# Patient Record
Sex: Female | Born: 1935 | Race: White | Hispanic: No | Marital: Married | Smoking: Never smoker
Health system: Southern US, Community
[De-identification: ages and names within clinical notes are randomized; demographics above are authoritative.]

## PROBLEM LIST (undated history)

## (undated) DIAGNOSIS — N6019 Diffuse cystic mastopathy of unspecified breast: Secondary | ICD-10-CM

## (undated) DIAGNOSIS — M1990 Unspecified osteoarthritis, unspecified site: Secondary | ICD-10-CM

## (undated) DIAGNOSIS — M25471 Effusion, right ankle: Secondary | ICD-10-CM

## (undated) DIAGNOSIS — C801 Malignant (primary) neoplasm, unspecified: Secondary | ICD-10-CM

## (undated) DIAGNOSIS — E7800 Pure hypercholesterolemia, unspecified: Secondary | ICD-10-CM

## (undated) DIAGNOSIS — K219 Gastro-esophageal reflux disease without esophagitis: Secondary | ICD-10-CM

## (undated) DIAGNOSIS — M545 Low back pain, unspecified: Secondary | ICD-10-CM

## (undated) DIAGNOSIS — M67952 Unspecified disorder of synovium and tendon, left thigh: Secondary | ICD-10-CM

## (undated) DIAGNOSIS — B0229 Other postherpetic nervous system involvement: Secondary | ICD-10-CM

## (undated) DIAGNOSIS — M25472 Effusion, left ankle: Secondary | ICD-10-CM

## (undated) DIAGNOSIS — F419 Anxiety disorder, unspecified: Secondary | ICD-10-CM

## (undated) DIAGNOSIS — Z853 Personal history of malignant neoplasm of breast: Secondary | ICD-10-CM

## (undated) DIAGNOSIS — R252 Cramp and spasm: Secondary | ICD-10-CM

## (undated) DIAGNOSIS — L299 Pruritus, unspecified: Secondary | ICD-10-CM

## (undated) DIAGNOSIS — G4700 Insomnia, unspecified: Secondary | ICD-10-CM

## (undated) DIAGNOSIS — N184 Chronic kidney disease, stage 4 (severe): Secondary | ICD-10-CM

## (undated) DIAGNOSIS — R600 Localized edema: Secondary | ICD-10-CM

## (undated) DIAGNOSIS — K5900 Constipation, unspecified: Secondary | ICD-10-CM

## (undated) DIAGNOSIS — I10 Essential (primary) hypertension: Secondary | ICD-10-CM

## (undated) DIAGNOSIS — R531 Weakness: Secondary | ICD-10-CM

## (undated) DIAGNOSIS — E871 Hypo-osmolality and hyponatremia: Secondary | ICD-10-CM

## (undated) DIAGNOSIS — M8580 Other specified disorders of bone density and structure, unspecified site: Secondary | ICD-10-CM

## (undated) DIAGNOSIS — K589 Irritable bowel syndrome without diarrhea: Secondary | ICD-10-CM

## (undated) DIAGNOSIS — H3530 Unspecified macular degeneration: Secondary | ICD-10-CM

## (undated) DIAGNOSIS — D649 Anemia, unspecified: Secondary | ICD-10-CM

## (undated) HISTORY — DX: Cramp and spasm: R25.2

## (undated) HISTORY — DX: Low back pain, unspecified: M54.50

## (undated) HISTORY — PX: KNEE ARTHROSCOPY: SUR90

## (undated) HISTORY — DX: Chronic kidney disease, stage 4 (severe): N18.4

## (undated) HISTORY — DX: Localized edema: R60.0

## (undated) HISTORY — PX: CATARACT EXTRACTION W/ INTRAOCULAR LENS  IMPLANT, BILATERAL: SHX1307

## (undated) HISTORY — DX: Anemia, unspecified: D64.9

## (undated) HISTORY — DX: Diffuse cystic mastopathy of unspecified breast: N60.19

## (undated) HISTORY — PX: ROTATOR CUFF REPAIR: SHX139

## (undated) HISTORY — DX: Unspecified osteoarthritis, unspecified site: M19.90

## (undated) HISTORY — DX: Hypo-osmolality and hyponatremia: E87.1

## (undated) HISTORY — DX: Irritable bowel syndrome, unspecified: K58.9

## (undated) HISTORY — DX: Gastro-esophageal reflux disease without esophagitis: K21.9

## (undated) HISTORY — DX: Other postherpetic nervous system involvement: B02.29

## (undated) HISTORY — DX: Constipation, unspecified: K59.00

## (undated) HISTORY — DX: Pure hypercholesterolemia, unspecified: E78.00

## (undated) HISTORY — DX: Pruritus, unspecified: L29.9

## (undated) HISTORY — DX: Weakness: R53.1

---
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
## 1998-12-21 ENCOUNTER — Other Ambulatory Visit: Admission: RE | Admit: 1998-12-21 | Discharge: 1998-12-21 | Payer: Self-pay | Admitting: Gynecology

## 2001-03-14 HISTORY — PX: ELBOW SURGERY: SHX618

## 2007-06-14 ENCOUNTER — Encounter (INDEPENDENT_AMBULATORY_CARE_PROVIDER_SITE_OTHER): Payer: Self-pay | Admitting: Diagnostic Radiology

## 2007-06-14 ENCOUNTER — Encounter: Admission: RE | Admit: 2007-06-14 | Discharge: 2007-06-14 | Payer: Self-pay | Admitting: Surgery

## 2007-06-19 ENCOUNTER — Encounter: Admission: RE | Admit: 2007-06-19 | Discharge: 2007-06-19 | Payer: Self-pay | Admitting: Surgery

## 2007-07-30 ENCOUNTER — Encounter (INDEPENDENT_AMBULATORY_CARE_PROVIDER_SITE_OTHER): Payer: Self-pay | Admitting: Surgery

## 2007-07-30 ENCOUNTER — Encounter: Admission: RE | Admit: 2007-07-30 | Discharge: 2007-07-30 | Payer: Self-pay | Admitting: Surgery

## 2007-08-08 ENCOUNTER — Ambulatory Visit (HOSPITAL_BASED_OUTPATIENT_CLINIC_OR_DEPARTMENT_OTHER): Admission: RE | Admit: 2007-08-08 | Discharge: 2007-08-09 | Payer: Self-pay | Admitting: Surgery

## 2007-08-08 ENCOUNTER — Encounter (INDEPENDENT_AMBULATORY_CARE_PROVIDER_SITE_OTHER): Payer: Self-pay | Admitting: Surgery

## 2007-08-08 ENCOUNTER — Ambulatory Visit (HOSPITAL_COMMUNITY): Admission: RE | Admit: 2007-08-08 | Discharge: 2007-08-08 | Payer: Self-pay | Admitting: Surgery

## 2007-08-08 HISTORY — PX: TOTAL MASTECTOMY: SHX6129

## 2007-08-21 ENCOUNTER — Ambulatory Visit: Payer: Self-pay | Admitting: Oncology

## 2008-01-09 ENCOUNTER — Ambulatory Visit (HOSPITAL_BASED_OUTPATIENT_CLINIC_OR_DEPARTMENT_OTHER): Admission: RE | Admit: 2008-01-09 | Discharge: 2008-01-09 | Payer: Self-pay | Admitting: Orthopedic Surgery

## 2008-01-09 HISTORY — PX: OTHER SURGICAL HISTORY: SHX169

## 2010-04-04 ENCOUNTER — Encounter: Payer: Self-pay | Admitting: Surgery

## 2010-07-27 ENCOUNTER — Encounter (INDEPENDENT_AMBULATORY_CARE_PROVIDER_SITE_OTHER): Payer: Self-pay | Admitting: Surgery

## 2010-07-27 NOTE — Op Note (Signed)
NAMEERMON, SARVIS             ACCOUNT NO.:  [id number]   MEDICAL RECORD NO.:  [id number]          PATIENT TYPE:  AMB   LOCATION:  [place]                          FACILITY:  [facility]   PHYSICIAN:  Isabel Caprice. Hassell Done, MD  DATE OF BIRTH:  [birth year]   DATE OF PROCEDURE:  08/08/2007  DATE OF DISCHARGE:                               OPERATIVE REPORT   PREOPERATIVE DIAGNOSIS:  Extensive ductal carcinoma in situ, clinical  stage T0 N0 MX.   POSTOPERATIVE DIAGNOSIS:  Ductal carcinoma in situ, permanent sections  pending, sentinel lymph node mapping, and biopsy specimen x2 negative.   PROCEDURE:  Right sentinel lymph node mapping and sentinel lymph node  biopsy (2), right total mastectomy.   SURGEON:  Isabel Caprice. Hassell Done, MD   ANESTHESIA:  General endotracheal.   DESCRIPTION OF PROCEDURE:  Ms. 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  She had no palpable  lymph nodes preoperatively, but had multiple core biopsies from a wide  area in the right outer breast, which were positive for DCIS.  Informed  consent was obtained regarding total mastectomy and sentinel lymph node  biopsy.   Preoperatively, she was injected and she was taken to OR-3, on  Wednesday, Aug 08, 2007, given general anesthesia.  The chest wall was  prepped totally with Techni-Care after I had preoperatively marked it  and mapped it.  I elected to go ahead and do my mastectomy incision and  then accessed the axilla through that to do sentinel lymph node biopsy.  Once she was Prodraped, I created an ellipse of breast tissue  incorporating the two core needle biopsy sites and had carried this  ellipse down to the fat.  Flaps were raised superiorly and inferiorly.  I first used an Kelly clamp to help demarcate the flap thickness and  then using electrocautery to create the flaps, carrying them superiorly  and then medial and inferior, and then down to the chest wall.  I went  out  laterally to the latissimus dorsi muscle and went up into the axilla  along the pectoralis margin.   Next, I used the sheath, NeoProbe and mapped the axilla, and found a  very hard area which I dissected out, which had counts over 1200, and  this was the #1 sentinel lymph node.  Then on re-mapping, I found  another hard area and dissected a free node, we had counts in the 150 to  200 range.  Both had some methylene blue which I had injected prior to  prepping and draping, again doing this in the subareolar region.  I then  completed the mastectomy taking the breast off the chest wall, leaving  the pectoralis fascia, and carrying up to get the axillary tail of  Spence.  The suture was placed on the lateral most portion of the breast  and irrigated with saline and surveyed both flaps medially, inferiorly,  and laterally cauterizing anything that looked like it might be  bleeding, but did not see anything.  I injected entire with 0.5%  lidocaine plain.  I had  used some clips in the axilla or anything it  looked to be  substantively vascular.  I then closed the skin flaps with interrupted 3-  0 Monocryl and with staples.  The patient seemed to tolerate the  procedure well and was taken to the recovery room and will be kept at  [facility] for overnight observation.      Isabel Caprice Hassell Done, MD  Electronically Signed     MBM/MEDQ  D:  08/08/2007  T:  08/09/2007  Job:  [id number]   cc:   Julieta Gutting, M.D.

## 2010-07-27 NOTE — Op Note (Signed)
Victoria Moran, Victoria Moran             ACCOUNT NO.:  [id number]   MEDICAL RECORD NO.:  [id number]          PATIENT TYPE:  AMB   LOCATION:  [place]                          FACILITY:  [facility]   PHYSICIAN:  Weber Cooks, M.D.     DATE OF BIRTH:  1935-05-12   DATE OF PROCEDURE:  01/09/2008  DATE OF DISCHARGE:                               OPERATIVE REPORT   PREOPERATIVE DIAGNOSES:  1. Left hypermobile first ray.  2. Left hallux valgus.  3. Left left tight gastrocnemius.  4. Left second hammertoe.  5. Left second metatarsalgia.   POSTOPERATIVE DIAGNOSES:  1. Left hypermobile first ray.  2. Left hallux valgus.  3. Left left tight gastrocnemius.  4. Left second hammertoe.  5. Left second metatarsalgia.   OPERATION:  1. Left first TMT joint fusion with osteotomy.  2. Left local bone graft.  3. Left modified McBride bunionectomy.  4. Left great toe digital nerve neurolysis.  5. Left gastroc slide.  6. Left second toe amputation through MTP joint.  7. Left second Weil metatarsal shortening osteotomy.   ANESTHESIA:  General.   SURGEON:  Weber Cooks, MD   ASSISTANT:  Erskine Emery, PA-C   ESTIMATED BLOOD LOSS:  Minimal.   TOURNIQUET TIME:  Approximately 1-1/2 hour.   COMPLICATIONS:  None.   DISPOSITION:  Stable to the PR.   INDICATIONS:  This 75 year old female, who has had the above pathology.  She had previous left second hammertoe reconstruction where they excised  the base of the proximal phalanx.  She has had chronic dorsally  dislocated second MTP joint with persistent pain plantarly along with a  hypermobile first ray with metatarsus primus varus and hallux valgus  deformity.  She was consented for the above procedure.  All risks  including infection or vessel injury, nonunion, malunion, hardware  irritation, hardware failure, persistent pain, worsening pain, prolonged  recovery, stiffness, arthritis, recurrence of hallux valgus, development  of hallux varus, all explained  questions were encouraged and answered.   OPERATION:  The patient was brought to the operating room, placed in  supine position.  After adequate general anesthesia was administered as  well as Ancef 1 g IV piggyback, left lower extremity was prepped and  draped in manner, proximally placed thigh tourniquet.  A longitudinal  incision over the medial aspect of gastrocnemius muscle tendinous  junction was then made.  Dissection was carried down through skin.  Hemostasis was obtained.  Fascia was opened in line with incision.  Conjoined region was then developed to indicate gastroc soleus  musculature.  Soft tissue was elevated off the posterior aspect  gastrocnemius.  Sural nerve was identified and protected posteriorly.  Gastrocnemius then released with curved Mayo scissors.  The sacs  released tight gastroc, the area was copiously irrigated with normal  saline.  Subcu was closed with  4-0 PDS.  Subcu was closed 4-0 Monocryl  subcuticular stitch.  Steri-Strips were applied.  Limb was then gravity  exsanguinated, tourniquet inflated to 90 mmHg.  A longitudinal incision  midline over the medial aspect of the left great toe MTP joint was then  made.  Dissection was carried down through skin.  Hemostasis was  obtained.  Great toe dorsomedial digital nerve neurolysis was then  performed.  This was retracted out of harm's way throughout  the  procedure.  L-shaped capsulotomy was then made.  A simple bunionectomy  was then performed with a sagittal saw.  Yvone Neu Johnson's ridge was then  rounded off with a rongeur.  Joint area copiously with normal saline and  lateral capsule was then released with curved Beaver blade protecting  the cartilaginous surfaces with a Soil scientist.  This had outstanding  release of the lateral capsule.  Toe was held in reduced position.  Capsule was reconstructed, advanced both superiorly and proximally with  3-0 PDS stitch.  This had an Systems analyst.  Capsule  needle was  then trimmed off.  We then made a longitudinal incision midline between  EHL and EHB.  Dissection was carried down through skin.  Hemostasis was  obtained.  We developed the interval between EHL and EHB.  There was a  small superficial vein that we nicked and we repaired with 5-0 nylon  stitch.  This had an outstanding repair and hemostasis was obtained.  Soft tissue was elevated off the dorsal aspect of the first TMP joint,  base of first metatarsal, and distal aspect of the medial cuneiform.  We  then entered the first TMP joint, then with a sagittal saw, we removed a  wedge off the base of the first metatarsal and medial cuneiform  respectively, closing wedge type to correct the metatarsus primus varus  and removed the remaining cartilage with curved quarter-inch osteotome  and rongeur.  Once surfaces were perfectly congruent, we then placed a  two-point reduction clamp on this area and then obtained a C-arm to  verify that this was adequately corrected.  We then went back to the  first TMP joint and placed multiple 2-mm drill holes on either side of  the joint.  We then anatomically reduced this with a two-point reduction  clamp and then provisionally fixed with 2 mm K-wire.  We then using a  bur, created a notch approximately 2 cm distal to the first TMP joint  into the base of first metatarsal.  We then placed a 3.5-mm fully  threaded cortical lag screw using a 3.5 and 2.5-mm drill hole  respectively.  There was excellent purchase compression across the first  TMP joint fusion site.  Local bone graft obtained from the spurs as well  as the drills were placed on the back table.  We then removed the K-wire  and replaced this with a 3.5-mm fully-threaded cortical lag screw using  a 3.5 and 2.5-mm drill hole respectively.  This had excellent purchase  compression across the fusion site.  This was also countersunk using a  bur prior to placement.  Local bone graft again was  placed on the back  table for later placement.  We then made a racket-shaped incision around  the base of second mallet toe and dissection was carried down directly  to bone.  Soft tissue was elevated to the base of proximal phalanx and  the toe was removed.  The metatarsal head was then identified and  carefully dissected out.  We then using stacked sagittal saw blades,  performed a Weil metatarsal area osteotomy.  This was translated  approximately 5-6 mm proximally and then repaired with a 1.5-mm fully-  threaded cortical set screw using a 1.1-mm drill hole respectively.  This had excellent purchase  and maintenance of the correction.  Redundant bone ledge dorsally was trimmed off with a rongeur.  The area  was copiously irrigated with normal saline.  Stress x-rays obtained, AP  and lateral planes showed no gross motion, fixation, proposition  excellent as well.  We then went back to first TMP joint, with a bur, we  placed strain-relieving local bone graft as described by Meyer Russel.  Tourniquet was deflated.  Hemostasis was obtained.  There was no pulsatile bleeding.  There was palpable dorsalis pedis  pulse.  Subcu was closed with Vicryl.  Skin was closed with  4-0 PDS.  Skin was closed with 4-0 nylon.  No Vicryl was used throughout the case.  Sterile dressing was applied.  Roger-Mann dressing was applied.  Modified Jones dressing was applied.  The patient was stable.      Weber Cooks, M.D.  Electronically Signed     PB/MEDQ  D:  01/09/2008  T:  01/10/2008  Job:  [id number]

## 2010-09-30 ENCOUNTER — Ambulatory Visit (INDEPENDENT_AMBULATORY_CARE_PROVIDER_SITE_OTHER): Payer: Self-pay | Admitting: Surgery

## 2010-10-20 ENCOUNTER — Ambulatory Visit (INDEPENDENT_AMBULATORY_CARE_PROVIDER_SITE_OTHER): Payer: Self-pay | Admitting: Surgery

## 2010-11-19 ENCOUNTER — Encounter (INDEPENDENT_AMBULATORY_CARE_PROVIDER_SITE_OTHER): Payer: Self-pay | Admitting: Surgery

## 2010-11-19 ENCOUNTER — Ambulatory Visit (INDEPENDENT_AMBULATORY_CARE_PROVIDER_SITE_OTHER): Payer: Medicare Other | Admitting: Surgery

## 2010-11-19 VITALS — BP 122/68 | HR 64

## 2010-11-19 DIAGNOSIS — C50919 Malignant neoplasm of unspecified site of unspecified female breast: Secondary | ICD-10-CM

## 2010-11-19 NOTE — Progress Notes (Signed)
Mr. And Mrs Victoria Moran came in for a bMxreast cancer followup.  Her mastectomy was performed May 2009 for a pT1b, pN0(+), pMX    I performed a complete exam of her right chest and flaps in her left breast. No suspicious masses are noted. No axillary masses are noted. No supraclavicular masses are noted. She is not taking her Evista regularly. I asked her to relate that to Dr. Hinton Rao when she sees her.  Therefore I see no physical evidence of any recurrent cancer in her breast. She did share with me that she had gotten a doctor in [place] to repair her toe which had been a constant source of bother her last several years. Plan return in one year in followup.

## 2010-12-08 LAB — POCT I-STAT, CHEM 8
Calcium, Ion: 1.15
Glucose, Bld: 105 — ABNORMAL HIGH
HCT: 40
Hemoglobin: 13.6
TCO2: 30

## 2010-12-13 LAB — BASIC METABOLIC PANEL
BUN: 15
Calcium: 9.3
Chloride: 110
Creatinine, Ser: 0.83
GFR calc Af Amer: >60
GFR calc non Af Amer: >60

## 2010-12-13 LAB — POCT HEMOGLOBIN-HEMACUE: Hemoglobin: 12.1

## 2011-09-20 ENCOUNTER — Encounter (INDEPENDENT_AMBULATORY_CARE_PROVIDER_SITE_OTHER): Payer: Self-pay | Admitting: Surgery

## 2012-02-28 DIAGNOSIS — M48061 Spinal stenosis, lumbar region without neurogenic claudication: Secondary | ICD-10-CM | POA: Diagnosis present

## 2012-02-28 NOTE — H&P (Signed)
History of Present Illness The patient is a 76 year old female who presents with back pain. The patient is here today in referral from Dr. Nelva Bush. The patient reports low back symptoms including low back pain which began 3 month(s) ago without any known injury. The pain radiates to the left buttock and left lower leg. The patient describes the pain as aching. The symptom onset was gradual. The patient describes the severity of their symptoms as severe. The patient feels as if the symptoms are worsening. Symptoms are exacerbated by standing. Symptoms are relieved by rest. Current treatment includes non-opioid analgesics. Prior to being seen today the patient was previously evaluated by a colleague. Past evaluation has included MRI of the lumbar spine. Past treatment has included corticosteroids (ESI 12/06/2011).  Additional reason for visit:  Neck painis described as the following: The patient reports symptoms involving the entire neck which began 3 month(s) ago. The symptoms began without any known injury. Past evaluation has included cervical spine MRI. Past treatment has included muscle relaxants and spinal injections.   Subjective Transcription  REASON FOR CONSULTATION:  Severe low back pain.  Moderate neck pain.    Victoria Moran is a very pleasant young woman who has been having ongoing progressive low back pain for several years now. In April 2013 she had a lumbar epidural steroid injection which was very effective in helping to alleviate her overall pain, but it was short lived. She then had two subsequent piriformis injections which provided no relief. In September, she had a repeat ESI which only helped for a short period. She states her quality of life has significantly deteriorated. She is not getting any relief with the injections any more, nor is she getting any relief with physical therapy. She states she has to walk in a forward flexed position as if if she stands upright  the pain is quite severe. She describes bilateral buttock pain that radiates down into the thighs. This is worse on the left side. The left side even goes down into the calf. The patient was referred to me by Dr. Nelva Bush for ongoing management.  Allergies CODEINE. 01/13/2003   Family History Osteoporosis. mother and father Hypertension. mother, father, grandmother mothers side and grandfather mothers side Osteoarthritis. mother, father and grandfather mothers side Heart Disease. grandmother mothers side and grandfather mothers side Congestive Heart Failure. mother   Social History Living situation. live with spouse Tobacco use. never smoker Pain Contract. no Marital status. married Number of flights of stairs before winded. greater than 5 Illicit drug use. no Tobacco / smoke exposure. no Exercise. Exercises daily; does running / walking Current work status. 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 never consumed alcohol Children. 1 Drug/Alcohol Rehab (Previously). no   Medication History Norco (10-325MG  Tablet, 1 Oral four times daily, as needed, Taken starting 01/04/2012) Active. Clorazepate Dipotassium (7.5MG  Tablet, Oral) Active. Fluticasone Propionate (50MCG/ACT Suspension, Nasal) Active. ClonazePAM (0.5MG  Tablet, Oral) Active. Tylenol (500MG  Capsule, Oral) Active.   Past Surgical History Cataract Surgery. bilateral Foot Surgery. left Mastectomy. right Rotator Cuff Repair. right Arthroscopy of Knee. left Breast Biopsy. right Breast Mass; Local Excision. right   Other Problems Osteoporosis Breast Cancer High blood pressure Hypercholesterolemia   Objective Transcription  She is a pleasant young woman who appears her stated age, no acute distress. She is alert and oriented times three. She has neck pain that is mild with palpation and range of motion. There is 5/5 strength in the upper extremities and lower  extremities.  She does have bilateral numbness and dysesthesias in the legs. Intact but diminished peripheral pulses in her lower extremity. No real hip, knee or ankle pain with joint range of motion. No history of incontinence of bowel and bladder. The lungs are clear to auscultation. Heart RRR. No shortness of breath or chest pain. Abdomen is soft and nontender. No rebound tenderness. She has significant back pain with extension or lateral rotation or lateral bending. No significant pain with forward flexion.      RADIOGRAPHS:  Lumbar MRI performed 12-28-11 demonstrates significant spinal stenosis at L3-4 and L4-5 with facet arthrosis and lateral recess stenosis. There is also foraminal stenosis. She does have some minor disease changes at L2-3 and L5-S1 but they are not as significant as L3-4 and L4-5.    Cervical MRI demonstrates multi-level degenerative disk disease with a slight anterior listhesis at C3 on C4. No cord signal changes.        Assessment & Plan Lumbar disc degeneration (722.52)  Spinal Stenosis, Lumbar (724.02)   Assessments Transcription  At this point in time, the patient has lumbar spinal stenosis at L3-4 and L4-5. Despite appropriate conservative management she continues to deteriorate.    Plans Transcription  We had a long discussion about surgical intervention. This would be a L3-4, L4-5 lumbar decompression.    I did review the risk with her to include infection, bleeding, nerve damage, death , stroke, paralysis, failure to heal, ongoing or worse pain, loss of bowel and/or bladder control, need for further surgery, recurrent spinal stenosis.    All of her questions and her husband's questions were addressed. She has expressed a desire to proceed with surgery. I have given them some literature to review. We will get primary care physician clearance. Once we have clearance from her primary care physician we will move forward with surgical  intervention.   Victoria Moran D. Rolena Infante, MD/jgc  Patient cleared by PCP Dr Truman Hayward.  FX: [name], MD [facility])

## 2012-03-01 ENCOUNTER — Encounter (HOSPITAL_COMMUNITY)
Admission: RE | Admit: 2012-03-01 | Discharge: 2012-03-01 | Disposition: A | Discharge status: Home / Self Care | Payer: Medicare Other | Source: Ambulatory Visit | Attending: Orthopedic Surgery | Admitting: Orthopedic Surgery

## 2012-03-01 ENCOUNTER — Ambulatory Visit (HOSPITAL_COMMUNITY)
Admission: RE | Admit: 2012-03-01 | Discharge: 2012-03-01 | Disposition: A | Discharge status: Home / Self Care | Payer: Medicare Other | Source: Ambulatory Visit | Attending: Anesthesiology | Admitting: Anesthesiology

## 2012-03-01 ENCOUNTER — Encounter (HOSPITAL_COMMUNITY): Payer: Self-pay

## 2012-03-01 ENCOUNTER — Encounter (HOSPITAL_COMMUNITY): Payer: Self-pay | Admitting: Pharmacy Technician

## 2012-03-01 DIAGNOSIS — M51379 Other intervertebral disc degeneration, lumbosacral region without mention of lumbar back pain or lower extremity pain: Secondary | ICD-10-CM | POA: Insufficient documentation

## 2012-03-01 DIAGNOSIS — M48061 Spinal stenosis, lumbar region without neurogenic claudication: Secondary | ICD-10-CM | POA: Insufficient documentation

## 2012-03-01 DIAGNOSIS — Z9010 Acquired absence of unspecified breast and nipple: Secondary | ICD-10-CM | POA: Insufficient documentation

## 2012-03-01 DIAGNOSIS — M47817 Spondylosis without myelopathy or radiculopathy, lumbosacral region: Secondary | ICD-10-CM | POA: Insufficient documentation

## 2012-03-01 DIAGNOSIS — M5137 Other intervertebral disc degeneration, lumbosacral region: Secondary | ICD-10-CM | POA: Insufficient documentation

## 2012-03-01 DIAGNOSIS — Z01812 Encounter for preprocedural laboratory examination: Secondary | ICD-10-CM | POA: Insufficient documentation

## 2012-03-01 HISTORY — DX: Essential (primary) hypertension: I10

## 2012-03-01 HISTORY — DX: Malignant (primary) neoplasm, unspecified: C80.1

## 2012-03-01 LAB — CBC
Platelets: 306 10*3/uL (ref 150–400)
RDW: 13.3 % (ref 11.5–15.5)
WBC: 9.7 10*3/uL (ref 4.0–10.5)

## 2012-03-01 LAB — BASIC METABOLIC PANEL
Chloride: 100 mEq/L (ref 96–112)
Creatinine, Ser: 0.93 mg/dL (ref 0.50–1.10)
GFR calc Af Amer: 68 mL/min — ABNORMAL LOW (ref >90)
Potassium: 3.8 mEq/L (ref 3.5–5.1)

## 2012-03-01 LAB — SURGICAL PCR SCREEN: MRSA, PCR: NEGATIVE

## 2012-03-01 NOTE — Progress Notes (Signed)
Pt here for PAT visit.  Denies sleep study and/or ECHO/Stress.  Faxed req for recent EKG from Dr. Joylene Igo [phone], [phone]).

## 2012-03-01 NOTE — Pre-Procedure Instructions (Signed)
[place]  03/01/2012   Your procedure is scheduled on:  Thursday, 03/08/2012@12 :06PM.  Report to [facility] at 10:00 AM.  Call this number if you have problems the morning of surgery: [phone]   Remember: Do not drink any liquids and/or eat any food after midnight, the night before surgery.     Take these medicines the morning of surgery with A SIP OF WATER: Metoprolol, Norvasc   Do not wear jewelry, make-up or nail polish.  Do not wear lotions, powders, or perfumes. You may wear deodorant.  Do not shave 48 hours prior to surgery. Men may shave face and neck.  Do not bring valuables to the hospital.  Contacts, dentures or bridgework may not be worn into surgery.  Leave suitcase in the car. After surgery it may be brought to your room.  For patients admitted to the hospital, checkout time is 11:00 AM the day of discharge.   Patients discharged the day of surgery will not be allowed to drive home.  Name and phone number of your driver: Victoria Moran, Victoria Moran  Special Instructions: Shower using CHG 2 nights before surgery and the night before surgery.  If you shower the day of surgery use CHG.  Use special wash - you have one bottle of CHG for all showers.  You should use approximately 1/3 of the bottle for each shower.   Please read over the following fact sheets that you were given: Pain Booklet, Coughing and Deep Breathing and Surgical Site Infection Prevention

## 2012-03-02 NOTE — Consult Note (Signed)
Anesthesia chart review: Patient is a 76 year old female scheduled for L3-5 lumbar decompression by Dr. Rolena Infante on 03/08/2012. History includes hypertension, arthritis, nonsmoker, breast cancer status post right mastectomy 2009.  PCP is Dr. [name] at [facility] ([place]).  Preoperative labs noted.  Chest x-ray on 03/01/2012 showed no active disease. Mild hyperinflation. Status post right mastectomy and right axillary lymph node dissection.  EKG on 02/13/12 ([facility]) showed NSR, RSR prime in V1, probable normal variant.  Anticipate she can proceed as planned.  Myra Gianotti, PA-C

## 2012-03-07 MED ORDER — CEFAZOLIN SODIUM-DEXTROSE 2-3 GM-% IV SOLR
2.0000 g | INTRAVENOUS | Status: AC
  Administered 2012-03-08: 2 g via INTRAVENOUS
  Filled 2012-03-07: qty 50

## 2012-03-08 ENCOUNTER — Encounter (HOSPITAL_COMMUNITY): Payer: Self-pay | Admitting: Certified Registered Nurse Anesthetist

## 2012-03-08 ENCOUNTER — Encounter (HOSPITAL_COMMUNITY): Payer: Self-pay | Admitting: Vascular Surgery

## 2012-03-08 ENCOUNTER — Ambulatory Visit (HOSPITAL_COMMUNITY): Payer: Medicare Other

## 2012-03-08 ENCOUNTER — Encounter (HOSPITAL_COMMUNITY)
Admission: RE | Disposition: A | Discharge status: Home / Self Care | Payer: Self-pay | Source: Ambulatory Visit | Attending: Orthopedic Surgery

## 2012-03-08 ENCOUNTER — Ambulatory Visit (HOSPITAL_COMMUNITY): Payer: Medicare Other | Admitting: Vascular Surgery

## 2012-03-08 ENCOUNTER — Inpatient Hospital Stay (HOSPITAL_COMMUNITY)
Admission: RE | Admit: 2012-03-08 | Discharge: 2012-03-10 | DRG: 491 | Disposition: A | Discharge status: Home / Self Care | Payer: Medicare Other | Source: Ambulatory Visit | Attending: Orthopedic Surgery | Admitting: Orthopedic Surgery

## 2012-03-08 DIAGNOSIS — I651 Occlusion and stenosis of basilar artery: Secondary | ICD-10-CM | POA: Diagnosis present

## 2012-03-08 DIAGNOSIS — M48061 Spinal stenosis, lumbar region without neurogenic claudication: Secondary | ICD-10-CM | POA: Diagnosis present

## 2012-03-08 DIAGNOSIS — E7800 Pure hypercholesterolemia, unspecified: Secondary | ICD-10-CM | POA: Diagnosis present

## 2012-03-08 DIAGNOSIS — M48062 Spinal stenosis, lumbar region with neurogenic claudication: Principal | ICD-10-CM | POA: Diagnosis present

## 2012-03-08 DIAGNOSIS — M5030 Other cervical disc degeneration, unspecified cervical region: Secondary | ICD-10-CM | POA: Diagnosis present

## 2012-03-08 DIAGNOSIS — I10 Essential (primary) hypertension: Secondary | ICD-10-CM | POA: Diagnosis present

## 2012-03-08 DIAGNOSIS — Z853 Personal history of malignant neoplasm of breast: Secondary | ICD-10-CM

## 2012-03-08 HISTORY — PX: DECOMPRESSIVE LUMBAR LAMINECTOMY LEVEL 2: SHX5792

## 2012-03-08 SURGERY — DECOMPRESSIVE LUMBAR LAMINECTOMY LEVEL 2
Anesthesia: General | Site: Back | Wound class: Clean

## 2012-03-08 MED ORDER — ZOLPIDEM TARTRATE 5 MG PO TABS: 5.0000 mg | ORAL_TABLET | Freq: Every evening | ORAL | Status: DC | PRN

## 2012-03-08 MED ORDER — AMLODIPINE BESYLATE 5 MG PO TABS
5.0000 mg | ORAL_TABLET | Freq: Every day | ORAL | Status: DC
  Administered 2012-03-09: 5 mg via ORAL
  Filled 2012-03-08 (×2): qty 1

## 2012-03-08 MED ORDER — HYDROMORPHONE HCL PF 1 MG/ML IJ SOLN
INTRAMUSCULAR | Status: AC
  Filled 2012-03-08: qty 1

## 2012-03-08 MED ORDER — OXYCODONE HCL 5 MG PO TABS: 5.0000 mg | ORAL_TABLET | Freq: Once | ORAL | Status: DC | PRN

## 2012-03-08 MED ORDER — LACTATED RINGERS IV SOLN
INTRAVENOUS | Status: DC | PRN
  Administered 2012-03-08 (×2): via INTRAVENOUS

## 2012-03-08 MED ORDER — ACETAMINOPHEN 10 MG/ML IV SOLN
1000.0000 mg | Freq: Four times a day (QID) | INTRAVENOUS | Status: AC
  Administered 2012-03-09 (×3): 1000 mg via INTRAVENOUS
  Filled 2012-03-08 (×4): qty 100

## 2012-03-08 MED ORDER — SODIUM CHLORIDE 0.9 % IJ SOLN
3.0000 mL | Freq: Two times a day (BID) | INTRAMUSCULAR | Status: DC
  Administered 2012-03-08 – 2012-03-09 (×2): 3 mL via INTRAVENOUS

## 2012-03-08 MED ORDER — GABAPENTIN 400 MG PO CAPS
400.0000 mg | ORAL_CAPSULE | Freq: Every day | ORAL | Status: DC
  Administered 2012-03-09: 400 mg via ORAL
  Filled 2012-03-08 (×2): qty 1

## 2012-03-08 MED ORDER — BUPIVACAINE-EPINEPHRINE PF 0.25-1:200000 % IJ SOLN
INTRAMUSCULAR | Status: AC
  Filled 2012-03-08: qty 30

## 2012-03-08 MED ORDER — HYDROMORPHONE HCL PF 1 MG/ML IJ SOLN
0.2500 mg | INTRAMUSCULAR | Status: DC | PRN
  Administered 2012-03-08 (×3): 0.5 mg via INTRAVENOUS

## 2012-03-08 MED ORDER — NEOSTIGMINE METHYLSULFATE 1 MG/ML IJ SOLN
INTRAMUSCULAR | Status: DC | PRN
  Administered 2012-03-08: 3 mg via INTRAVENOUS

## 2012-03-08 MED ORDER — MENTHOL 3 MG MT LOZG: 1.0000 | LOZENGE | OROMUCOSAL | Status: DC | PRN

## 2012-03-08 MED ORDER — METOPROLOL TARTRATE 100 MG PO TABS
100.0000 mg | ORAL_TABLET | Freq: Two times a day (BID) | ORAL | Status: DC
  Administered 2012-03-08 – 2012-03-09 (×2): 100 mg via ORAL
  Filled 2012-03-08 (×5): qty 1

## 2012-03-08 MED ORDER — ROCURONIUM BROMIDE 100 MG/10ML IV SOLN
INTRAVENOUS | Status: DC | PRN
  Administered 2012-03-08: 50 mg via INTRAVENOUS

## 2012-03-08 MED ORDER — METHOCARBAMOL 100 MG/ML IJ SOLN
500.0000 mg | Freq: Four times a day (QID) | INTRAVENOUS | Status: DC | PRN
  Filled 2012-03-08: qty 5

## 2012-03-08 MED ORDER — LACTATED RINGERS IV SOLN
INTRAVENOUS | Status: DC
  Administered 2012-03-08: 12:00:00 via INTRAVENOUS

## 2012-03-08 MED ORDER — METHOCARBAMOL 500 MG PO TABS
ORAL_TABLET | ORAL | Status: AC
  Filled 2012-03-08: qty 1

## 2012-03-08 MED ORDER — DOCUSATE SODIUM 100 MG PO CAPS
100.0000 mg | ORAL_CAPSULE | Freq: Two times a day (BID) | ORAL | Status: DC
  Administered 2012-03-08 – 2012-03-09 (×3): 100 mg via ORAL
  Filled 2012-03-08 (×4): qty 1

## 2012-03-08 MED ORDER — OXYCODONE HCL 5 MG/5ML PO SOLN: 5.0000 mg | Freq: Once | ORAL | Status: DC | PRN

## 2012-03-08 MED ORDER — ACETAMINOPHEN 10 MG/ML IV SOLN
INTRAVENOUS | Status: AC
  Filled 2012-03-08: qty 100

## 2012-03-08 MED ORDER — OXYCODONE HCL 5 MG PO TABS
ORAL_TABLET | ORAL | Status: AC
  Filled 2012-03-08: qty 2

## 2012-03-08 MED ORDER — VANCOMYCIN HCL 500 MG IV SOLR
INTRAVENOUS | Status: AC
  Filled 2012-03-08: qty 500

## 2012-03-08 MED ORDER — GLYCOPYRROLATE 0.2 MG/ML IJ SOLN
INTRAMUSCULAR | Status: DC | PRN
  Administered 2012-03-08: 0.4 mg via INTRAVENOUS

## 2012-03-08 MED ORDER — ONDANSETRON HCL 4 MG/2ML IJ SOLN: 4.0000 mg | Freq: Once | INTRAMUSCULAR | Status: DC | PRN

## 2012-03-08 MED ORDER — HYDROCORTISONE SOD SUCCINATE 100 MG IJ SOLR
INTRAMUSCULAR | Status: DC | PRN
  Administered 2012-03-08: 100 mg via INTRAVENOUS

## 2012-03-08 MED ORDER — PHENOL 1.4 % MT LIQD: 1.0000 | OROMUCOSAL | Status: DC | PRN

## 2012-03-08 MED ORDER — FENTANYL CITRATE 0.05 MG/ML IJ SOLN
INTRAMUSCULAR | Status: DC | PRN
  Administered 2012-03-08 (×2): 100 ug via INTRAVENOUS
  Administered 2012-03-08: 50 ug via INTRAVENOUS

## 2012-03-08 MED ORDER — SODIUM CHLORIDE 0.9 % IV SOLN: 250.0000 mL | INTRAVENOUS | Status: DC

## 2012-03-08 MED ORDER — THROMBIN 20000 UNITS EX SOLR
OROMUCOSAL | Status: DC | PRN
  Administered 2012-03-08: 14:00:00 via TOPICAL

## 2012-03-08 MED ORDER — MIDAZOLAM HCL 5 MG/5ML IJ SOLN
INTRAMUSCULAR | Status: DC | PRN
  Administered 2012-03-08 (×2): 1 mg via INTRAVENOUS

## 2012-03-08 MED ORDER — PHENYLEPHRINE HCL 10 MG/ML IJ SOLN
INTRAMUSCULAR | Status: DC | PRN
  Administered 2012-03-08: 80 ug via INTRAVENOUS
  Administered 2012-03-08 (×2): 40 ug via INTRAVENOUS
  Administered 2012-03-08: 80 ug via INTRAVENOUS
  Administered 2012-03-08: 40 ug via INTRAVENOUS

## 2012-03-08 MED ORDER — PROPOFOL 10 MG/ML IV BOLUS
INTRAVENOUS | Status: DC | PRN
  Administered 2012-03-08: 100 mg via INTRAVENOUS

## 2012-03-08 MED ORDER — LACTATED RINGERS IV SOLN: INTRAVENOUS | Status: DC

## 2012-03-08 MED ORDER — LIDOCAINE HCL (CARDIAC) 20 MG/ML IV SOLN
INTRAVENOUS | Status: DC | PRN
  Administered 2012-03-08: 100 mg via INTRAVENOUS

## 2012-03-08 MED ORDER — SODIUM CHLORIDE 0.9 % IJ SOLN: 3.0000 mL | INTRAMUSCULAR | Status: DC | PRN

## 2012-03-08 MED ORDER — PREDNISONE 5 MG PO TABS
5.0000 mg | ORAL_TABLET | Freq: Every day | ORAL | Status: DC
  Administered 2012-03-08 – 2012-03-09 (×2): 5 mg via ORAL
  Filled 2012-03-08 (×3): qty 1

## 2012-03-08 MED ORDER — ONDANSETRON HCL 4 MG/2ML IJ SOLN: 4.0000 mg | INTRAMUSCULAR | Status: DC | PRN

## 2012-03-08 MED ORDER — METHOCARBAMOL 500 MG PO TABS
500.0000 mg | ORAL_TABLET | Freq: Four times a day (QID) | ORAL | Status: DC | PRN
  Administered 2012-03-08 – 2012-03-10 (×4): 500 mg via ORAL
  Filled 2012-03-08 (×3): qty 1

## 2012-03-08 MED ORDER — CEFAZOLIN SODIUM 1-5 GM-% IV SOLN
1.0000 g | Freq: Three times a day (TID) | INTRAVENOUS | Status: AC
  Administered 2012-03-08 – 2012-03-09 (×2): 1 g via INTRAVENOUS
  Filled 2012-03-08 (×2): qty 50

## 2012-03-08 MED ORDER — MEPERIDINE HCL 25 MG/ML IJ SOLN: 6.2500 mg | INTRAMUSCULAR | Status: DC | PRN

## 2012-03-08 MED ORDER — FLEET ENEMA 7-19 GM/118ML RE ENEM: 1.0000 | ENEMA | Freq: Once | RECTAL | Status: AC | PRN

## 2012-03-08 MED ORDER — ACETAMINOPHEN 10 MG/ML IV SOLN
1000.0000 mg | Freq: Four times a day (QID) | INTRAVENOUS | Status: DC
  Administered 2012-03-08: 1000 mg via INTRAVENOUS
  Filled 2012-03-08 (×4): qty 100

## 2012-03-08 MED ORDER — THROMBIN 20000 UNITS EX SOLR
CUTANEOUS | Status: AC
  Filled 2012-03-08: qty 20000

## 2012-03-08 MED ORDER — OXYCODONE HCL 5 MG PO TABS
10.0000 mg | ORAL_TABLET | ORAL | Status: DC | PRN
  Administered 2012-03-08 – 2012-03-09 (×5): 10 mg via ORAL
  Administered 2012-03-09: 5 mg via ORAL
  Administered 2012-03-10 (×2): 10 mg via ORAL
  Filled 2012-03-08 (×6): qty 2
  Filled 2012-03-08: qty 1

## 2012-03-08 MED ORDER — HEMOSTATIC AGENTS (NO CHARGE) OPTIME
TOPICAL | Status: DC | PRN
  Administered 2012-03-08: 1 via TOPICAL

## 2012-03-08 MED ORDER — VANCOMYCIN HCL 500 MG IV SOLR
INTRAVENOUS | Status: DC | PRN
  Administered 2012-03-08: 500 mg

## 2012-03-08 MED ORDER — BUPIVACAINE-EPINEPHRINE PF 0.25-1:200000 % IJ SOLN
INTRAMUSCULAR | Status: DC | PRN
  Administered 2012-03-08: 10 mL

## 2012-03-08 MED ORDER — ONDANSETRON HCL 4 MG/2ML IJ SOLN
INTRAMUSCULAR | Status: DC | PRN
  Administered 2012-03-08: 4 mg via INTRAVENOUS

## 2012-03-08 SURGICAL SUPPLY — 47 items
BANDAGE GAUZE ELAST BULKY 4 IN (GAUZE/BANDAGES/DRESSINGS) ×2 IMPLANT
BUR EGG ELITE 4.0 (BURR) IMPLANT
CLOTH BEACON ORANGE TIMEOUT ST (SAFETY) ×2 IMPLANT
CLSR STERI-STRIP ANTIMIC 1/2X4 (GAUZE/BANDAGES/DRESSINGS) ×2 IMPLANT
CORDS BIPOLAR (ELECTRODE) ×2 IMPLANT
DRAPE C-ARM 42X72 X-RAY (DRAPES) ×2 IMPLANT
DRAPE POUCH INSTRU U-SHP 10X18 (DRAPES) ×2 IMPLANT
DRAPE SURG 17X11 SM STRL (DRAPES) ×2 IMPLANT
DRAPE U-SHAPE 47X51 STRL (DRAPES) ×2 IMPLANT
DRSG MEPILEX BORDER 4X4 (GAUZE/BANDAGES/DRESSINGS) ×2 IMPLANT
DRSG MEPILEX BORDER 4X8 (GAUZE/BANDAGES/DRESSINGS) ×2 IMPLANT
DURAPREP 26ML APPLICATOR (WOUND CARE) ×2 IMPLANT
ELECT BLADE 4.0 EZ CLEAN MEGAD (MISCELLANEOUS) ×2
ELECT CAUTERY BLADE 6.4 (BLADE) ×2 IMPLANT
ELECT REM PT RETURN 9FT ADLT (ELECTROSURGICAL) ×2
ELECTRODE BLDE 4.0 EZ CLN MEGD (MISCELLANEOUS) ×1 IMPLANT
ELECTRODE REM PT RTRN 9FT ADLT (ELECTROSURGICAL) ×1 IMPLANT
FLOSEAL (HEMOSTASIS) IMPLANT
GLOVE BIOGEL PI IND STRL 8.5 (GLOVE) ×1 IMPLANT
GLOVE BIOGEL PI INDICATOR 8.5 (GLOVE) ×1
GLOVE ECLIPSE 8.5 STRL (GLOVE) ×2 IMPLANT
GOWN PREVENTION PLUS XXLARGE (GOWN DISPOSABLE) ×2 IMPLANT
GOWN STRL NON-REIN LRG LVL3 (GOWN DISPOSABLE) ×2 IMPLANT
KIT BASIN OR (CUSTOM PROCEDURE TRAY) ×2 IMPLANT
KIT STIMULAN RAPID CURE 5CC (Orthopedic Implant) ×2 IMPLANT
NEEDLE 22X1 1/2 (OR ONLY) (NEEDLE) ×2 IMPLANT
NEEDLE SPNL 18GX3.5 QUINCKE PK (NEEDLE) ×4 IMPLANT
NS IRRIG 1000ML POUR BTL (IV SOLUTION) ×2 IMPLANT
PACK LAMINECTOMY ORTHO (CUSTOM PROCEDURE TRAY) ×2 IMPLANT
PACK UNIVERSAL I (CUSTOM PROCEDURE TRAY) ×2 IMPLANT
PATTIES SURGICAL .5 X.5 (GAUZE/BANDAGES/DRESSINGS) IMPLANT
PATTIES SURGICAL .5 X1 (DISPOSABLE) ×2 IMPLANT
SPONGE LAP 4X18 X RAY DECT (DISPOSABLE) ×2 IMPLANT
SPONGE SURGIFOAM ABS GEL 100 (HEMOSTASIS) ×2 IMPLANT
STAPLER VISISTAT 35W (STAPLE) IMPLANT
STRIP CLOSURE SKIN 1/2X4 (GAUZE/BANDAGES/DRESSINGS) IMPLANT
SUT MNCRL AB 3-0 PS2 18 (SUTURE) ×2 IMPLANT
SUT VIC AB 1 CT1 27 (SUTURE) ×2
SUT VIC AB 1 CT1 27XBRD ANTBC (SUTURE) ×2 IMPLANT
SUT VIC AB 2-0 CT1 18 (SUTURE) ×4 IMPLANT
SUT VICRYL 0 UR6 27IN ABS (SUTURE) ×2 IMPLANT
SYR BULB IRRIGATION 50ML (SYRINGE) ×2 IMPLANT
SYR CONTROL 10ML LL (SYRINGE) ×4 IMPLANT
TOWEL OR 17X26 10 PK STRL BLUE (TOWEL DISPOSABLE) ×4 IMPLANT
TRAY FOLEY CATH 14FR (SET/KITS/TRAYS/PACK) ×2 IMPLANT
WATER STERILE IRR 1000ML POUR (IV SOLUTION) ×2 IMPLANT
YANKAUER SUCT BULB TIP NO VENT (SUCTIONS) ×2 IMPLANT

## 2012-03-08 NOTE — H&P (Signed)
No change to clinical exam H+P reviewed

## 2012-03-08 NOTE — Transfer of Care (Signed)
Immediate Anesthesia Transfer of Care Note  Patient: Victoria Moran  Procedure(s) Performed: Procedure(s) (LRB) with comments: DECOMPRESSIVE LUMBAR LAMINECTOMY LEVEL 2 (N/A) - Decompression L3-L5   Patient Location: PACU  Anesthesia Type:General  Level of Consciousness: awake, alert  and oriented  Airway & Oxygen Therapy: Patient Spontanous Breathing and Patient connected to nasal cannula oxygen  Post-op Assessment: Report given to PACU RN, Post -op Vital signs reviewed and stable and Patient moving all extremities  Post vital signs: Reviewed and stable  Complications: No apparent anesthesia complications

## 2012-03-08 NOTE — Progress Notes (Signed)
Orthopedic Tech Progress Note Patient Details:  Victoria Moran 1935/05/09 [id number]  Patient ID: Victoria Moran, female   DOB: Dec 10, 1935, 76 y.o.   MRN: [id number] Called in bio-tech brace order @2152 ; spoke with Lebron Conners 03/08/2012, 9:53 PM

## 2012-03-08 NOTE — Brief Op Note (Signed)
03/08/2012  4:20 PM  PATIENT:  Victoria Moran  76 y.o. female  PRE-OPERATIVE DIAGNOSIS:  spinal stenosis L3-L5   POST-OPERATIVE DIAGNOSIS:  spinal stenosis L3-L5   PROCEDURE:  Procedure(s) (LRB) with comments: DECOMPRESSIVE LUMBAR LAMINECTOMY LEVEL 2 (N/A) - Decompression L3-L5   SURGEON:  Surgeon(s) and Role:    * Melina Schools, MD - Primary  PHYSICIAN ASSISTANT:   ASSISTANTS: none   ANESTHESIA:   general  EBL:  Total I/O In: 1300 [I.V.:1300] Out: -   BLOOD ADMINISTERED:none  DRAINS: none   LOCAL MEDICATIONS USED:  MARCAINE     SPECIMEN:  No Specimen  DISPOSITION OF SPECIMEN:  N/A  COUNTS:  YES  TOURNIQUET:  * No tourniquets in log *  DICTATION: .Other Dictation: Dictation Number [id number]  PLAN OF CARE: Admit to inpatient   PATIENT DISPOSITION:  PACU - hemodynamically stable.

## 2012-03-08 NOTE — OR Nursing (Signed)
Dr Conrad Palat[place] asked by Michail Jewels RN to sign out pt

## 2012-03-08 NOTE — Anesthesia Preprocedure Evaluation (Signed)
Anesthesia Evaluation  Patient identified by MRN, date of birth, ID band Patient awake    Reviewed: Allergy & Precautions, H&P , Patient's Chart, lab work & pertinent test results  Airway Mallampati: I TM Distance: >3 FB Neck ROM: Full    Dental   Pulmonary          Cardiovascular hypertension, Pt. on medications     Neuro/Psych    GI/Hepatic   Endo/Other    Renal/GU      Musculoskeletal   Abdominal   Peds  Hematology   Anesthesia Other Findings   Reproductive/Obstetrics                           Anesthesia Physical Anesthesia Plan  ASA: II  Anesthesia Plan: General   Post-op Pain Management:    Induction: Intravenous  Airway Management Planned: Oral ETT  Additional Equipment:   Intra-op Plan:   Post-operative Plan: Extubation in OR  Informed Consent: I have reviewed the patients History and Physical, chart, labs and discussed the procedure including the risks, benefits and alternatives for the proposed anesthesia with the patient or authorized representative who has indicated his/her understanding and acceptance.     Plan Discussed with: CRNA and Surgeon  Anesthesia Plan Comments:         Anesthesia Quick Evaluation

## 2012-03-09 MED ORDER — CYCLOBENZAPRINE HCL 10 MG PO TABS: 10.0000 mg | ORAL_TABLET | Freq: Every day | ORAL | Status: DC

## 2012-03-09 MED ORDER — DOCUSATE SODIUM 100 MG PO CAPS: 100.0000 mg | ORAL_CAPSULE | Freq: Two times a day (BID) | ORAL | Status: DC | PRN

## 2012-03-09 MED ORDER — HYDROCODONE-ACETAMINOPHEN 10-325 MG PO TABS: 1.0000 | ORAL_TABLET | Freq: Four times a day (QID) | ORAL | Status: DC | PRN

## 2012-03-09 MED ORDER — ONDANSETRON HCL 4 MG PO TABS: 4.0000 mg | ORAL_TABLET | Freq: Three times a day (TID) | ORAL | Status: DC | PRN

## 2012-03-09 NOTE — Anesthesia Postprocedure Evaluation (Signed)
  Anesthesia Post-op Note  Patient: Victoria Moran  Procedure(s) Performed: Procedure(s) (LRB) with comments: DECOMPRESSIVE LUMBAR LAMINECTOMY LEVEL 2 (N/A) - Decompression L3-L5   Patient Location: 5N32C  Anesthesia Type:General  Level of Consciousness: awake, alert  and oriented  Airway and Oxygen Therapy: Patient Spontanous Breathing  Post-op Pain: mild  Post-op Assessment: Post-op Vital signs reviewed, Patient's Cardiovascular Status Stable, Respiratory Function Stable, Patent Airway, No signs of Nausea or vomiting, Adequate PO intake and Pain level controlled  Post-op Vital Signs: Reviewed and stable  Complications: No apparent anesthesia complications

## 2012-03-09 NOTE — Clinical Social Work Note (Signed)
Clinical Social Worker received referral for possible ST-SNF placement.  Chart reviewed.  PT/OT are not recommending home health at this time.  CSW signing off - please re consult if social work needs arise.  Barbette Or, [place]

## 2012-03-09 NOTE — Progress Notes (Signed)
Orthopedic Tech Progress Note Patient Details:  Victoria Moran 1935-12-23 [id number] Brace order completed.  Patient ID: ANELE PINKS, female   DOB: August 25, 1935, 76 y.o.   MRN: [id number]   Fenton Foy 03/09/2012, 12:42 PM

## 2012-03-09 NOTE — Evaluation (Signed)
Occupational Therapy Evaluation Patient Details Name: Victoria Moran MRN: [id number] DOB: 13-Jun-1935 Today's Date: 03/09/2012 Time: [id number] OT Time Calculation (min): 26 min  OT Assessment / Plan / Recommendation Clinical Impression  76 yo female s/p Lumbar spinal stenosis, 3-4, 4-5 that could benefit from skilled OT acutely Recommend hhot for d/c planning.     OT Assessment  Patient needs continued OT Services    Follow Up Recommendations  Home health OT    Barriers to Discharge      Equipment Recommendations  3 in 1 bedside comode    Recommendations for Other Services    Frequency  Min 2X/week    Precautions / Restrictions Precautions Precautions: Back Precaution Comments: back handout present in room and pt educated using handout for ADls with back precautions Required Braces or Orthoses: Spinal Brace Spinal Brace: Lumbar corset;Applied in sitting position Restrictions Weight Bearing Restrictions: No   Pertinent Vitals/Pain Minimal pain reported RN in room provided medication at this time    ADL  Eating/Feeding: Set up Where Assessed - Eating/Feeding: Edge of bed Grooming: Set up;Wash/dry face;Wash/dry hands;Brushing hair Where Assessed - Grooming: Unsupported standing (min v/c for safety) Upper Body Dressing: Minimal assistance Where Assessed - Upper Body Dressing: Unsupported sitting Lower Body Dressing: Moderate assistance (pt is able to cross LT LE and plans to have husband (A)) Where Assessed - Lower Body Dressing: Supported sitting Toilet Transfer: Minimal assistance Toilet Transfer Method: Sit to stand Toilet Transfer Equipment: Raised toilet seat with arms (or 3-in-1 over toilet) Toileting - Clothing Manipulation and Hygiene: Minimal assistance Where Assessed - Toileting Clothing Manipulation and Hygiene: Sit to stand from 3-in-1 or toilet Equipment Used: Rolling walker;Gait belt;Back brace Transfers/Ambulation Related to ADLs: Pt completed transfer  with min (A) for safety wtih hand placement using RW. Pt attempting to abandon RW several times during session. Pt educated x3 during session safe position in RW for turning and changing directions. pt attempting to abandon RW to flush toilet. Pt states "doctor said if my feet stay in front I am okay to turn around" ADL Comments: PT educated on back precautions with adls. pt completed oral care at sink level . pt completed brushing hair in chair. Pt asked x2 during session how long precautions will last and advised that 12 weeks is a normal expected time but that these precautions are good back health. Pt making a face each time and repeating the 12 week time frame to husband. Pt will need to work on bed mobility and safey with RW during adls next session    OT Diagnosis: Generalized weakness;Acute pain  OT Problem List: Decreased strength;Decreased activity tolerance;Decreased safety awareness;Decreased knowledge of use of DME or AE;Decreased knowledge of precautions;Pain OT Treatment Interventions: Self-care/ADL training;DME and/or AE instruction;Therapeutic activities;Patient/family education;Balance training   OT Goals Acute Rehab OT Goals OT Goal Formulation: With patient/family Time For Goal Achievement: 03/23/12 Potential to Achieve Goals: Good ADL Goals Pt Will Perform Upper Body Bathing: with set-up;Sit to stand from chair;Unsupported ADL Goal: Upper Body Bathing - Progress: Goal set today Pt Will Perform Lower Body Bathing: with min assist;Sit to stand from chair (husband (A)) ADL Goal: Lower Body Bathing - Progress: Goal set today Pt Will Perform Upper Body Dressing: with set-up;Sit to stand from chair ADL Goal: Upper Body Dressing - Progress: Goal set today Pt Will Perform Lower Body Dressing: with min assist;Sit to stand from chair ((A) from husband) ADL Goal: Lower Body Dressing - Progress: Goal set today Pt Will Transfer to  Toilet: with modified independence;Ambulation;with  DME ADL Goal: Toilet Transfer - Progress: Goal set today Pt Will Perform Toileting - Hygiene: with modified independence;Sit to stand from 3-in-1/toilet ADL Goal: Toileting - Hygiene - Progress: Goal set today Miscellaneous OT Goals Miscellaneous OT Goal #1: Pt will complete bed mobility Supervision level with no rails and hob <20 degrees as precursor to adls OT Goal: Miscellaneous Goal #1 - Progress: Goal set today  Visit Information  Last OT Received On: 03/09/12 Assistance Needed: +1    Subjective Data  Subjective: "well how am I suppose to do my hair?"- pt worried about arching precaution and reaching hair. pt educated on precautions and ability to still do hair Patient Stated Goal: to return home this weekend   Prior [place] Lives With: Spouse Available Help at Discharge: Family;Available 24 hours/day Type of Home: House Home Access: Stairs to enter [employer] of Steps: 2 Entrance Stairs-Rails: None Home Layout: One level Bathroom Shower/Tub: Tub/shower unit;Door [employer] Toilet: Standard Bathroom Accessibility: Yes Home Adaptive Equipment: Straight cane;Walker - rolling Prior Function Level of Independence: Independent with assistive device(s) Able to Take Stairs?: Yes Driving: Yes Vocation: Retired Corporate investment banker: No difficulties Dominant Hand: Right         Vision/Perception     Cognition  Overall Cognitive Status: Appears within functional limits for tasks assessed/performed Arousal/Alertness: Awake/alert Orientation Level: Appears intact for tasks assessed Behavior During Session: [facility] for tasks performed    Extremity/Trunk Assessment Right Upper Extremity Assessment RUE ROM/Strength/Tone: Within functional levels RUE Sensation: WFL - Light Touch RUE Coordination: WFL - gross/fine motor Left Upper Extremity Assessment LUE ROM/Strength/Tone: Within functional levels LUE Sensation: WFL - Light Touch LUE  Coordination: WFL - gross/fine motor Right Lower Extremity Assessment RLE ROM/Strength/Tone: WFL for tasks assessed RLE Sensation: WFL - Light Touch;WFL - Proprioception Left Lower Extremity Assessment LLE ROM/Strength/Tone: Within functional levels LLE Sensation: WFL - Light Touch;WFL - Proprioception Trunk Assessment Trunk Assessment: Normal     Mobility Bed Mobility Bed Mobility: Not assessed (describes as being hard with PT ) Transfers Transfers: Sit to Stand;Stand to Sit Sit to Stand: 4: Min assist;With upper extremity assist;From chair/3-in-1 Stand to Sit: 4: Min assist;With upper extremity assist;To chair/3-in-1 Details for Transfer Assistance: v/c for safety with RW and reach back with RW. Pt attempting to abandon RW and move to chair     Shoulder Instructions     Exercise     Balance     End of Session OT - End of Session Activity Tolerance: Patient tolerated treatment well Patient left: in chair;with call bell/phone within reach;with family/visitor present Nurse Communication: Mobility status;Precautions (pt will need (A) from staff at this time to mobilize)  GO     Veneda Melter 03/09/2012, 11:37 AM Pager: [phone]

## 2012-03-09 NOTE — Evaluation (Signed)
Physical Therapy Evaluation Patient Details Name: Victoria Moran MRN: [id number] DOB: 1935/10/31 Today's Date: 03/09/2012 Time: 0800-0829 PT Time Calculation (min): 29 min  PT Assessment / Plan / Recommendation Clinical Impression  Pt is a 76 y/o female s/p lumbar decompression and lamenectomy.  Session limited secondary pt awaiting lumbar corsette (which arrived at end of session).  Acute PT will follow pt to progress mobility and independence for safe d/c home with support of spouse.      PT Assessment  Patient needs continued PT services    Follow Up Recommendations  No PT follow up;Supervision - Intermittent    Does the patient have the potential to tolerate intense rehabilitation      Barriers to Discharge None      Equipment Recommendations  None recommended by PT    Recommendations for Other Services     Frequency Min 5X/week    Precautions / Restrictions Precautions Precautions: Back Precaution Comments: Educated pt in back precautions and provided pt with handout.  Required Braces or Orthoses: Spinal Brace Spinal Brace: Lumbar corset;Applied in sitting position Restrictions Weight Bearing Restrictions: No   Pertinent Vitals/Pain No pain at rest.  No c/o pain during activity.       Mobility  Bed Mobility Bed Mobility: Rolling Left;Left Sidelying to Sit;Sitting - Scoot to Edge of Bed Rolling Left: 4: Min assist Left Sidelying to Sit: 4: Min assist Sitting - Scoot to Edge of Bed: 4: Min assist Details for Bed Mobility Assistance: Verbal and tactile cues for log rolling technique.  Transfers Transfers: Sit to Stand;Stand to Sit Sit to Stand: 5: Supervision;From bed;With upper extremity assist Stand to Sit: 5: Supervision;To chair/3-in-1;With upper extremity assist Details for Transfer Assistance: Cues for hand placement  Ambulation/Gait Ambulation/Gait Assistance: 5: Supervision Ambulation Distance (Feet): 15 Feet Assistive device: Rolling  walker Ambulation/Gait Assistance Details: No instruction required.  Gait Pattern: Within Functional Limits Gait velocity: WFl Stairs: No Wheelchair Mobility Wheelchair Mobility: No    Shoulder Instructions     Exercises     PT Diagnosis: Acute pain  PT Problem List: Decreased mobility;Decreased knowledge of precautions;Pain;Decreased activity tolerance PT Treatment Interventions: Gait training;Stair training;DME instruction;Functional mobility training;Therapeutic activities;Patient/family education   PT Goals Acute Rehab PT Goals PT Goal Formulation: With patient/family Time For Goal Achievement: 03/16/12 Potential to Achieve Goals: Good Pt will Roll Supine to Right Side: with modified independence PT Goal: Rolling Supine to Right Side - Progress: Goal set today Pt will go Supine/Side to Sit: with modified independence PT Goal: Supine/Side to Sit - Progress: Goal set today Pt will go Sit to Supine/Side: with modified independence PT Goal: Sit to Supine/Side - Progress: Goal set today Pt will go Sit to Stand: with modified independence PT Goal: Sit to Stand - Progress: Goal set today Pt will go Stand to Sit: with modified independence PT Goal: Stand to Sit - Progress: Goal set today Pt will Ambulate: 51 - 150 feet;with modified independence;with least restrictive assistive device PT Goal: Ambulate - Progress: Goal set today Additional Goals Additional Goal #1: Pt will verbalize and demonstrate 3/3 back precautions.  PT Goal: Additional Goal #1 - Progress: Goal set today  Visit Information  Last PT Received On: 03/09/12 Assistance Needed: +1    Subjective Data  Subjective: Agree to PT eval    Prior Functioning  Home Living Lives With: Spouse Available Help at Discharge: Family;Available 24 hours/day Type of Home: House Home Access: Stairs to enter [employer] of Steps: 2 Entrance Stairs-Rails: None  Home Layout: One level Bathroom Shower/Tub: Tub/shower  unit;Door [employer] Toilet: Standard Bathroom Accessibility: Yes Home Adaptive Equipment: Straight cane;Walker - rolling Prior Function Level of Independence: Independent with assistive device(s) Able to Take Stairs?: Yes Driving: Yes Vocation: Retired Corporate investment banker: No difficulties Dominant Hand: Right    Cognition  Overall Cognitive Status: Appears within functional limits for tasks assessed/performed Arousal/Alertness: Awake/alert Orientation Level: Appears intact for tasks assessed Behavior During Session: [facility] for tasks performed    Extremity/Trunk Assessment Right Upper Extremity Assessment RUE ROM/Strength/Tone: Within functional levels RUE Sensation: WFL - Light Touch RUE Coordination: WFL - gross/fine motor Left Upper Extremity Assessment LUE ROM/Strength/Tone: Within functional levels LUE Sensation: WFL - Light Touch LUE Coordination: WFL - gross/fine motor Right Lower Extremity Assessment RLE ROM/Strength/Tone: WFL for tasks assessed RLE Sensation: WFL - Light Touch;WFL - Proprioception Left Lower Extremity Assessment LLE ROM/Strength/Tone: Within functional levels LLE Sensation: WFL - Light Touch;WFL - Proprioception Trunk Assessment Trunk Assessment: Normal   Balance Balance Balance Assessed: No  End of Session PT - End of Session Equipment Utilized During Treatment: Gait belt;Back brace Activity Tolerance: Patient tolerated treatment well Patient left: in chair  GP     Jerzee Jerome 03/09/2012, 2:40 PM  Hameed Kolar L. Velinda Wrobel DPT [phone]

## 2012-03-09 NOTE — Op Note (Signed)
NAMERANAY, MCCOMMONS             ACCOUNT NO.:  [id number]  MEDICAL RECORD NO.:  [id number]  LOCATION:  [id number]                        FACILITY:  [facility]  PHYSICIAN:  Dahlia Bailiff, MD    DATE OF BIRTH:  [birth year]  DATE OF PROCEDURE:  03/08/2012 DATE OF DISCHARGE:                              OPERATIVE REPORT   PREOPERATIVE DIAGNOSIS:  Lumbar spinal stenosis, 3-4, 4-5.  POSTOPERATIVE DIAGNOSIS:  Lumbar spinal stenosis, 3-4, 4-5.  OPERATIVE PROCEDURE:  Lumbar decompression, L3-L5.  COMPLICATIONS:  None.  CONDITION:  Stable.  HISTORY:  This is a very pleasant elderly woman who has been having severe back, buttock, and bilateral leg pain.  The patient's symptoms were consistent with spinal stenosis with neurogenic claudication. Attempts at conservative management have failed to alleviate her symptoms and so she elected to proceed with surgery.  All appropriate risks, benefits, and alternatives were discussed with the patient and consent was obtained.  OPERATIVE NOTE:  The patient was brought to the operating room, placed supine on the operating table.  After successful induction of general anesthesia and endotracheal intubation, TEDs, SCDs, and Foley were inserted.  She was turned prone onto the [place] frame.  All bony prominences were well padded and the back was prepped and draped in a standard fashion.  Time-out was done confirming patient, procedure, and all other pertinent important data.  Two needles were placed into the back to mark out the proposed incision site.  Once this was completed and x-rays demonstrated a 3-5 spinal level.  The proposed skin incision was infiltrated with 0.25% Marcaine.  Incision was made starting at the superior aspect of L3 proceeding to the inferior aspect of L5.  Sharp dissection was carried out down to and through the deep fascia.  I then used a Cobb elevator and Bovie to dissect the paraspinal muscles and expose the spinous process and  lamina of L3, L4, and L5.  Once I had the posterior elements exposed, I then took a Kocher and placed it at the L4- 5 and L3-4 levels spanning the L4 spinous process.  X-rays confirmed that I was at the appropriate level.  Once this was completed, I then used a double-action Leksell rongeur to remove the spinous process of L4 and L5 in their entirety.  Because of the severe stenosis noted at 4-5 preoperatively, I knew I needed to get from the L5 pedicle and the decompression need to go at least down to the L5 pedicle.  I elected to remove the spinous process of L5 and perform a complete laminectomy in order to adequately decompress the spine.  Once I had removed the spinous processes with a small curette to develop a plane underneath the lamina.  I then used a 3 mm Kerrison to perform a generous laminotomy. I then released the ligamentum flavum from the lamina and placed patty between the dura and the ligamentum flavum.  I then removed the ligamentum flavum with a 3 mm Kerrison to complete the central decompression.  I then completed the laminectomy of L5.  I then continued to advance cranially using a Woodson elevator to create a plane and then to protect the plane with a patty and then  to remove bone and ligamentum flavum centrally with a 3-mm Kerrison.  I decompressed all the way up to the L3 lamina.  Partial laminotomy of L3 was performed.  At this point, I had an adequate decompression.  I then carried my dissection into the lateral gutter removing very significant amounts of osteophytes, thickened ligamentum flavum, and small cysts at the facet joint.  I decompressed out to the lateral gutter until I could palpate the L3, L4, and L5 pedicles.  I then went into the foramen and undercut the foramen in order to adequately decompress this area.  At this point, I then went to the contralateral side and repeated the decompression in the lateral recess and foramen with Kerrison rongeurs on  the contralateral side.  At this point, I could take my [facility] and pass it centrally in the lateral recess and out the L3, L4, L5 foramen without any difficulty bilaterally.  I had an adequate decompression.  At this point, I took an x-ray to confirm that I was spanning from the L3 to the L5 pedicle.  Once I had confirmed this, then I knew I had adequate decompression of the most stenotic areas.  I then irrigated the wound copiously with normal saline, and then obtained hemostasis using bipolar electrocautery and Floseal.  Once the Floseal was allowed to set up, I then irrigated out and then I placed thrombin- soaked Gelfoam patty over the exposed thecal sac.  I then took antibiotic impregnated vancomycin beads and placed them in the wound for added postoperative and preventative measures.  I then closed in a layered fashion with interrupted #1 Vicryl sutures, then 2-0 Vicryl sutures, and 3-0 Monocryl for the skin.  Steri-Strips and dry dressing were applied. The patient was then ultimately extubated and transferred to the PACU without incident.  At the end of the case, all needle and sponge counts were correct.  There was no adverse intraoperative events.     Dahlia Bailiff, MD     DDB/MEDQ  D:  03/08/2012  T:  03/09/2012  Job:  [id number]

## 2012-03-09 NOTE — Discharge Summary (Signed)
Patient ID: Victoria Moran MRN: [id number] DOB/AGE: 06/19/1935 77 y.o.  Admit date: 03/08/2012 Discharge date: 03/09/2012  Admission Diagnoses:  Principal Problem:  *Lumbar stenosis   Discharge Diagnoses:  Principal Problem:  *Lumbar stenosis  status post Procedure(s): DECOMPRESSIVE LUMBAR LAMINECTOMY LEVEL 2  Past Medical History  Diagnosis Date  . Arthritis   . Hypertension   . Cancer 2009    Surgeries: Procedure(s): DECOMPRESSIVE LUMBAR LAMINECTOMY LEVEL 2 on 03/08/2012   Consultants:  none  Discharged Condition: Improved  Hospital Course: Victoria Moran is an 76 y.o. female who was admitted 03/08/2012 for operative treatment of Lumbar stenosis. Patient failed conservative treatments (please see the history and physical for the specifics) and had severe unremitting pain that affects sleep, daily activities and work/hobbies. After pre-op clearance, the patient was taken to the operating room on 03/08/2012 and underwent  Procedure(s): DECOMPRESSIVE LUMBAR LAMINECTOMY LEVEL 2.    Patient was given perioperative antibiotics: Anti-infectives     Start     Dose/Rate Route Frequency Ordered Stop   03/08/12 2200   ceFAZolin (ANCEF) IVPB 1 g/50 mL premix        1 g 100 mL/hr over 30 Minutes Intravenous Every 8 hours 03/08/12 1742 03/09/12 0710   03/08/12 1539   vancomycin (VANCOCIN) powder  Status:  Discontinued          As needed 03/08/12 1539 03/08/12 1639   03/07/12 1541   ceFAZolin (ANCEF) IVPB 2 g/50 mL premix        2 g 100 mL/hr over 30 Minutes Intravenous 30 min pre-op 03/07/12 1541 03/08/12 1350           Patient was given sequential compression devices and early ambulation to prevent DVT.   Patient benefited maximally from hospital stay and there were no complications. At the time of discharge, the patient was urinating/moving their bowels without difficulty, tolerating a regular diet, pain is controlled with oral pain medications and they have  been cleared by PT/OT.   Recent vital signs: Patient Vitals for the past 24 hrs:  BP Temp Temp src Pulse Resp SpO2  03/09/12 0711 145/58 mmHg 97.9 F (36.6 C) - 66  20  99 %  03/08/12 2343 140/58 mmHg 98.6 F (37 C) - 65  16  99 %  03/08/12 1735 143/56 mmHg 98 F (36.7 C) - 69  14  97 %  03/08/12 1727 - - - 66  13  100 %  03/08/12 1726 - - - 68  13  99 %  03/08/12 1725 - - - 65  13  99 %  03/08/12 1724 - - - 64  13  100 %  03/08/12 1723 - - - 65  14  100 %  03/08/12 1722 - - - 66  14  100 %  03/08/12 1721 - - - 66  10  99 %  03/08/12 1720 137/48 mmHg - - 64  10  98 %  03/08/12 1719 - - - 64  11  100 %  03/08/12 1718 - - - 65  10  100 %  03/08/12 1717 - - - 69  10  100 %  03/08/12 1716 - - - 71  10  100 %  03/08/12 1715 - - - 69  12  100 %  03/08/12 1714 - - - 65  10  99 %  03/08/12 1713 - - - 68  16  100 %  03/08/12 1712 - - -  66  14  100 %  03/08/12 1711 - - - 65  11  100 %  03/08/12 1710 - - - 66  13  100 %  03/08/12 1709 - - - 67  15  100 %  03/08/12 1708 - - - 67  11  100 %  03/08/12 1707 - - - 69  14  100 %  03/08/12 1706 - - - 71  13  100 %  03/08/12 1705 146/52 mmHg - - 70  16  100 %  03/08/12 1704 - - - 67  15  100 %  03/08/12 1703 - - - 70  12  100 %  03/08/12 1702 - - - 71  11  100 %  03/08/12 1701 - - - 68  13  100 %  03/08/12 1700 - - - 66  12  100 %  03/08/12 1659 - - - 67  16  100 %  03/08/12 1658 - - - 69  15  100 %  03/08/12 1657 - - - 70  9  100 %  03/08/12 1656 - - - 69  11  100 %  03/08/12 1655 - - - 69  11  100 %  03/08/12 1654 - - - 70  13  100 %  03/08/12 1653 - - - 70  10  100 %  03/08/12 1652 - - - 71  13  100 %  03/08/12 1651 - - - - 11  -  03/08/12 1650 143/70 mmHg - - - - -  03/08/12 1645 143/70 mmHg 98.7 F (37.1 C) - 71  - 100 %  03/08/12 1007 135/74 mmHg 97.9 F (36.6 C) Oral 65  18  98 %     Recent laboratory studies: No results found for this basename:  WBC:2,HGB:2,HCT:2,PLT:2,NA:2,K:2,CL:2,CO2:2,BUN:2,CREATININE:2,GLUCOSE:2,PT:2,INR:2,CALCIUM,2: in the last 72 hours   Discharge Medications:     Medication List     As of 03/09/2012  7:29 AM    ASK your doctor about these medications         amLODipine 5 MG tablet   Commonly known as: NORVASC   Take 5 mg by mouth daily.      cholecalciferol 1000 UNITS tablet   Commonly known as: VITAMIN D   Take 1,000 Units by mouth daily.      clorazepate 7.5 MG tablet   Commonly known as: TRANXENE   Take 7.5 mg by mouth at bedtime.      cyclobenzaprine 10 MG tablet   Commonly known as: FLEXERIL   Take 10 mg by mouth at bedtime.      gabapentin 400 MG capsule   Commonly known as: NEURONTIN   Take 400 mg by mouth daily.      GLUCOSAMINE PO   Take 1 Can by mouth daily.      HYDROcodone-acetaminophen 10-325 MG per tablet   Commonly known as: NORCO   Take 1 tablet by mouth 4 (four) times daily as needed. For pain      LUTEIN 20 PO   Take 1 tablet by mouth daily.      metoprolol 50 MG tablet   Commonly known as: LOPRESSOR   Take 100 mg by mouth 2 (two) times daily.      nabumetone 750 MG tablet   Commonly known as: RELAFEN   Take 750 mg by mouth 2 (two) times daily.      OVER THE COUNTER MEDICATION   Take 1 tablet by mouth 4 (four)  times daily. "Macula Protect Complete"      OVER THE COUNTER MEDICATION   Take 1,000 mg by mouth daily. "[place]"      predniSONE 5 MG tablet   Commonly known as: DELTASONE   Take 5 mg by mouth daily.        Diagnostic Studies: Dg Chest 2 View  03/01/2012  *RADIOLOGY REPORT*  Clinical Data: Preop  CHEST - 2 VIEW  Comparison: 10/03/2007  Findings: Cardiomediastinal silhouette is stable.  No acute infiltrate or pleural effusion.  No pulmonary edema.  Mild hyperinflation again noted.  Again noted status post right mastectomy and right axillary lymph node dissection.  IMPRESSION: No active disease.  Mild hyperinflation.  Status post right  mastectomy and right axillary lymph node dissection.   Original Report Authenticated By: Lahoma Crocker, M.D.    Dg Lumbar Spine 2-3 Views  03/08/2012  *RADIOLOGY REPORT*  Clinical Data: Intraoperative films the localization.  LUMBAR SPINE - 2-3 VIEW  Comparison: PA and lateral lumbar spine 03/01/2012.  Findings: We are provided with two intraoperative views of the lumbar spine in the lateral projection.  On film labeled #1, probes are directed at the level of L2-3 and L5-S1 levels.  On film labeled #2, probes are at the level of the L3 and L4 pedicles.  IMPRESSION: Probes are directed at the L3 and L4 pedicles on the second film.   Original Report Authenticated By: Orlean Patten, M.D.    X-ray Lumbar Spine Ap And Lateral  03/01/2012  *RADIOLOGY REPORT*  Clinical Data: Preadmit back surgery.  LUMBAR SPINE - 2-3 VIEW  Comparison: Of 10/05/2007  Findings: Five non-rib bearing lumbar segments.  Lowest disc spaces L5-S1.  Mild posterior slip L2-3.  Grade 1 anterior slip L3-4.  Disc space narrowing is present throughout the lumbar spine.  There is moderate disc space narrowing and mild spondylosis at L4-5.  L5-S1 is intact.  Negative for fracture or mass lesion.  IMPRESSION: Diffuse lumbar disc degeneration and spondylosis.  No acute bony abnormality   Original Report Authenticated By: Carl Best, M.D.    Dg Lumbar Spine 1 View  03/08/2012  *RADIOLOGY REPORT*  Clinical Data: Decompression L3-L5  LUMBAR SPINE - 1 VIEW  Comparison: 03/08/2012  Findings: Surgical instrument under the lamina of L2 with the tip overlying the spinal canal behind the L2 vertebral body.  Second instrument is present over the top of the L5 lamina and overlying the spinal canal behind L5 vertebral body.  Grade 1 slip L3-4.  Disc degeneration is present throughout the lumbar spine.  IMPRESSION: Lumbar localization as above.   Original Report Authenticated By: Carl Best, M.D.           Follow-up Information    Follow up with  Dahlia Bailiff, MD. Call in 2 weeks. (As needed if symptoms worsen)    Contact information:   [address] [place] [address] [phone]          Discharge Plan:  discharge to home  Disposition:  Patient stable Cleared by PT/OT Plan on d/c to home f/u in 2 weeks for wound check   Signed: Melina Schools D for Dr. Melina Schools [facility] [phone] 03/09/2012, 7:29 AM

## 2012-03-09 NOTE — Progress Notes (Signed)
    Subjective: Procedure(s) (LRB): DECOMPRESSIVE LUMBAR LAMINECTOMY LEVEL 2 (N/A) 1 Day Post-Op  Patient reports pain as 4 on 0-10 scale.  Reports decreased leg pain minimal incisional back pain   Positive void Negative bowel movement Positive flatus Negative chest pain or shortness of breath  Objective: Vital signs in last 24 hours: Temp:  [97.9 F (36.6 C)-98.7 F (37.1 C)] 97.9 F (36.6 C) (12/27 0711) Pulse Rate:  [64-71] 66  (12/27 0711) Resp:  [9-20] 20  (12/27 0711) BP: (135-146)/(48-74) 145/58 mmHg (12/27 0711) SpO2:  [97 %-100 %] 99 % (12/27 0711)  Intake/Output from previous day: 12/26 0701 - 12/27 0700 In: 1450 [P.O.:150; I.V.:1300] Out: 350 [Urine:350]  Labs: No results found for this basename: WBC:2,RBC:2,HCT:2,PLT:2 in the last 72 hours No results found for this basename: NA:2,K:2,CL:2,CO2:2,BUN:2,CREATININE:2,GLUCOSE:2,CALCIUM:2 in the last 72 hours No results found for this basename: LABPT:2,INR:2 in the last 72 hours  Physical Exam: Neurologically intact ABD soft Neurovascular intact Sensation intact distally Intact pulses distally Incision: dressing C/D/I Compartment soft  Assessment/Plan: Patient stable  xrays n/a Continue mobilization with physical therapy Continue care  Advance diet Up with therapy Possible d/c over the weekend. Will discuss need for HHS with PT/OT  Melina Schools, MD [place] [phone]

## 2012-03-10 NOTE — Progress Notes (Signed)
    Subjective: Procedure(s) (LRB): DECOMPRESSIVE LUMBAR LAMINECTOMY LEVEL 2 (N/A) 2 Days Post-Op  Patient reports pain as 3 on 0-10 scale.  Reports none leg pain minimal incisional back pain   Positive void Negative bowel movement Positive flatus Negative chest pain or shortness of breath  Objective: Vital signs in last 24 hours: Temp:  [98.3 F (36.8 C)-101.1 F (38.4 C)] 99.7 F (37.6 C) (12/28 0535) Pulse Rate:  [60-101] 60  (12/28 0535) Resp:  [17-18] 18  (12/28 0535) BP: (109-143)/(46-72) 109/46 mmHg (12/28 0535) SpO2:  [86 %-100 %] 95 % (12/28 0605)  Intake/Output from previous day: 12/27 0701 - 12/28 0700 In: 76 [P.O.:460] Out: -   Labs: No results found for this basename: WBC:2,RBC:2,HCT:2,PLT:2 in the last 72 hours No results found for this basename: NA:2,K:2,CL:2,CO2:2,BUN:2,CREATININE:2,GLUCOSE:2,CALCIUM:2 in the last 72 hours No results found for this basename: LABPT:2,INR:2 in the last 72 hours  Physical Exam: Neurologically intact ABD soft Neurovascular intact Intact pulses distally Dorsiflexion/Plantar flexion intact Incision: dressing C/D/I and no drainage Compartment soft  Assessment/Plan: Patient stable  xrays n/a Continue mobilization with physical therapy Continue care  Up with therapy Patient doing well.  Ambulated yesterday without significant issues Ok for d/c to home Will make sure all DMG's are provided  Melina Schools, MD [place] [phone]

## 2012-03-10 NOTE — Progress Notes (Signed)
Occupational Therapy Treatment Patient Details Name: Victoria Moran MRN: [id number] DOB: 1935-05-07 Today's Date: 03/10/2012 Time: [id number] OT Time Calculation (min): 12 min  OT Assessment / Plan / Recommendation Comments on Treatment Session Pt progressing toward goals but continues to require assist with recalling and maintaining back precautions.  Husband able to demosntrate independence in cueing pt to maintain precautions. Anticipate d/c home today.    Follow Up Recommendations  Home health OT    Barriers to Discharge       Equipment Recommendations  None recommended by OT (pt reports she has 3n1 at home)    Recommendations for Other Services    Frequency Min 2X/week   Plan Discharge plan remains appropriate    Precautions / Restrictions Precautions Precautions: Back Precaution Comments: Pt unable to recall back precautions.  Reviewed all 3 back precautions with pt and husband. Required Braces or Orthoses: Spinal Brace Spinal Brace: Lumbar corset;Applied in sitting position Restrictions Weight Bearing Restrictions: No   Pertinent Vitals/Pain Pt nauseated- RN aware    ADL  Grooming: Wash/dry hands;Performed;Supervision/safety Where Assessed - Grooming: Unsupported standing Toilet Transfer: Min guard;Performed Armed forces technical officer Method: Sit to Loss adjuster, chartered: Comfort height toilet;Grab bars Toileting - Clothing Manipulation and Hygiene: Performed;Min guard Where Assessed - Best boy and Hygiene: Sit to stand from 3-in-1 or toilet Equipment Used: Rolling walker;Back brace Transfers/Ambulation Related to ADLs: Pt completed toilet transfer at supervision/min guard level with RW. ADL Comments: Required max verbal cueing (husband provided cueing) for safe hand placement and to turn with RW rather than leave RW to go sit in chair. Pt limited by nausea and therefore required increased time for mobilty. Discussed back precautions and ADL  techniques with pt. Pt reports she will either lay supine in bed while bending knees for LB dressing tasks or have husband assist. Husband present during session and is independent in assisting as well as independent in cueing pt on safey and back precautions. Pt and husband report that they have a walk in shower as well as tub shower. Recommended use of walk in shower as safer option compared to tub transfer. Husband also verbalized they own a 3n1 at home which they can use as shower chair.    OT Diagnosis:    OT Problem List:   OT Treatment Interventions:     OT Goals ADL Goals Pt Will Perform Lower Body Bathing: with min assist;Sit to stand from chair ADL Goal: Lower Body Bathing - Progress: Progressing toward goals Pt Will Perform Lower Body Dressing: with min assist;Sit to stand from chair ADL Goal: Lower Body Dressing - Progress: Progressing toward goals Pt Will Transfer to Toilet: with modified independence;Ambulation;with DME ADL Goal: Toilet Transfer - Progress: Progressing toward goals Pt Will Perform Toileting - Hygiene: with modified independence;Sit to stand from 3-in-1/toilet ADL Goal: Toileting - Hygiene - Progress: Progressing toward goals  Visit Information  Last OT Received On: 03/10/12    Subjective Data      Prior Functioning       Cognition  Overall Cognitive Status: Appears within functional limits for tasks assessed/performed Arousal/Alertness: Awake/alert Orientation Level: Appears intact for tasks assessed Behavior During Session: [facility] for tasks performed    Mobility  Shoulder Instructions Bed Mobility Bed Mobility: Not assessed Rolling Left: 4: Min assist Left Sidelying to Sit: 4: Min assist Sitting - Scoot to Edge of Bed: 4: Min assist Details for Bed Mobility Assistance: Peformed log roll to left side 3 times. Educated spouse on technique to  assist pt (pt pulling his hand) and educated spouse on technique to assist pt to transition to sitting  (supporting pt's upper back.) Pt mostly limited by pain with movement.  Transfers Transfers: Sit to Stand;Stand to Sit Sit to Stand: 6: Modified independent (Device/Increase time);From toilet Stand to Sit: 4: Min guard;To chair/3-in-1;With armrests;With upper extremity assist Details for Transfer Assistance: min guard for safe descent to chair due to pt attempting to leave RW and sit before safely at chair       Exercises      Balance     End of Session OT - End of Session Equipment Utilized During Treatment: Back brace Activity Tolerance: Other (comment) (limited by nausea) Patient left: in chair;with call bell/phone within reach;with family/visitor present Nurse Communication: Patient requests pain meds (nausea)  GO   03/10/2012 Darrol Jump OTR/L Pager [phone] Office [phone]   Darrol Jump 03/10/2012, 9:58 AM

## 2012-03-10 NOTE — Progress Notes (Signed)
Physical Therapy Treatment Patient Details Name: Victoria Moran MRN: [id number] DOB: [birth year] Today's Date: 03/10/2012 Time: [id number] PT Time Calculation (min): 31 min  PT Assessment / Plan / Recommendation Comments on Treatment Session  Pt doing well with mobility.  Will see once more this morning to ensure safety with stairs.     Follow Up Recommendations  Supervision - Intermittent;Home health PT     Does the patient have the potential to tolerate intense rehabilitation     Barriers to Discharge        Equipment Recommendations  None recommended by PT    Recommendations for Other Services    Frequency Min 5X/week   Plan Discharge plan remains appropriate;Frequency remains appropriate    Precautions / Restrictions Precautions Precautions: Back Precaution Comments: Pt unable to recall back precautions.  Reviewed all 3 back precautions with pt and husband. Required Braces or Orthoses: Spinal Brace Spinal Brace: Lumbar corset;Applied in sitting position Restrictions Weight Bearing Restrictions: No    Pertinent Vitals/Pain 5/10 pain in low back. Pt medicated about an hour prior to PT arrival. Pt limited by c/o worsening nausea. RN notified.     Mobility  Bed Mobility Bed Mobility: Rolling Left;Left Sidelying to Sit;Sitting - Scoot to [employer] of Bed Rolling Left: 4: Min assist Left Sidelying to Sit: 4: Min assist Sitting - Scoot to Edge of Bed: 4: Min assist Details for Bed Mobility Assistance: Peformed log roll to left side 3 times. Educated spouse on technique to assist pt (pt pulling his hand) and educated spouse on technique to assist pt to transition to sitting (supporting pt's upper back.) Pt mostly limited by pain with movement.  Transfers Transfers: Sit to Stand;Stand to Sit Sit to Stand: 6: Modified independent (Device/Increase time);With upper extremity assist;From bed Stand to Sit: 6: Modified independent (Device/Increase time);To chair/3-in-1 Details for  Transfer Assistance: No instruction required.  Supervision for safety secondary to pt c/o feeling nauseated.   Ambulation/Gait Ambulation/Gait Assistance: 5: Supervision Ambulation Distance (Feet): 100 Feet Assistive device: Rolling walker Ambulation/Gait Assistance Details: Cues for decreased dependence on UE and increase gait speed.   Gait Pattern: Decreased stride length Gait velocity: WFl Stairs: Yes Stairs Assistance: 4: Min assist Stairs Assistance Details (indicate cue type and reason): Educated pt in proper sequencing and technique to ascend/descend 3 steps forward with a cane and HHA, and 3 stairs backwards with RW.  Spouse instructed on proper technique to assist pt.   Provided pt with handout for posterior technique. Stair Management Technique: No rails;Step to pattern;Backwards;Forwards;With walker;With cane;Other (comment) (HHA.) Number of Stairs: 3  (2 trials) Wheelchair Mobility Wheelchair Mobility: No    Exercises     PT Diagnosis:    PT Problem List:   PT Treatment Interventions:     PT Goals Acute Rehab PT Goals PT Goal Formulation: With patient/family Time For Goal Achievement: 03/16/12 Potential to Achieve Goals: Good Pt will Roll Supine to Right Side: with modified independence PT Goal: Rolling Supine to Right Side - Progress: Progressing toward goal Pt will go Supine/Side to Sit: with modified independence PT Goal: Supine/Side to Sit - Progress: Progressing toward goal Pt will go Sit to Supine/Side: with modified independence PT Goal: Sit to Supine/Side - Progress: Progressing toward goal Pt will go Sit to Stand: with modified independence PT Goal: Sit to Stand - Progress: Met Pt will go Stand to Sit: with modified independence PT Goal: Stand to Sit - Progress: Met Pt will Ambulate: 51 - 150 feet;with modified independence;with least  restrictive assistive device PT Goal: Ambulate - Progress: Met Pt will Go Up / Down Stairs: 1-2 stairs;with min assist;with  least restrictive assistive device PT Goal: Up/Down Stairs - Progress: Progressing toward goal Additional Goals Additional Goal #1: Pt will verbalize and demonstrate 3/3 back precautions.  PT Goal: Additional Goal #1 - Progress: Progressing toward goal  Visit Information  Last PT Received On: 03/10/12    Subjective Data  Subjective: I feel sick   Cognition  Overall Cognitive Status: Appears within functional limits for tasks assessed/performed Arousal/Alertness: Awake/alert Orientation Level: Appears intact for tasks assessed Behavior During Session: [facility] for tasks performed    Balance     End of Session PT - End of Session Equipment Utilized During Treatment: Gait belt;Back brace Activity Tolerance: Patient limited by fatigue;Other (comment) (Pt limited by c/o worsening nausea.  RN notified. ) Patient left: in chair;with call bell/phone within reach;with family/visitor present Nurse Communication: Mobility status;Other (comment) (request for nausea medication. )   GP     Nelwyn Salisbury 03/10/2012, 9:18 AM Collier Salina L. Styles Fambro DPT [phone]

## 2012-03-10 NOTE — Progress Notes (Signed)
D/C instructions reviewed with patient and husband. RX x 4 called into [facility] in [place]. Hard scripts given to pt and instructed to give to pharmacist. No hh services or equipment needed. All questions answered. Pt d/c'ed via wheelchair in stable condition

## 2012-03-10 NOTE — Progress Notes (Signed)
Physical Therapy Treatment Patient Details Name: Victoria Moran MRN: [id number] DOB: [birth year] Today's Date: 03/10/2012 Time: [id number] PT Time Calculation (min): 23 min  PT Assessment / Plan / Recommendation Comments on Treatment Session  Pt has met acute PT goals.  Continues to c/o nausea and lethagy which she attrbutes to pain meds being too strong.  Notified RN.     Follow Up Recommendations  Home health PT;Supervision/Assistance - 24 hour     Does the patient have the potential to tolerate intense rehabilitation     Barriers to Discharge        Equipment Recommendations  None recommended by PT    Recommendations for Other Services    Frequency Min 5X/week   Plan All goals met and education completed, patient dischaged from PT services    Precautions / Restrictions Precautions Precautions: Back Precaution Comments: Pt unable to recall back precautions.  Reviewed all 3 back precautions with pt and husband. Required Braces or Orthoses: Spinal Brace Spinal Brace: Lumbar corset;Applied in sitting position Restrictions Weight Bearing Restrictions: No    Pertinent Vitals/Pain 4/10 low back pain.  No intervention requested by pt. BP in sitting on EOB  121/62    Mobility  Bed Mobility Bed Mobility: Rolling Left;Left Sidelying to Sit Rolling Left: 4: Min assist Left Sidelying to Sit: 4: Min assist Sitting - Scoot to Edge of Bed: 4: Min assist Details for Bed Mobility Assistance: Pt and spouse performed all bed mobility with verbal cueing from PT.  Transfers Transfers: Sit to Stand;Stand to Sit Sit to Stand: 6: Modified independent (Device/Increase time);From bed Stand to Sit: 6: Modified independent (Device/Increase time);To bed;With upper extremity assist Details for Transfer Assistance: min guard for safe descent to chair due to pt attempting to leave RW and sit before safely at chair Ambulation/Gait Ambulation/Gait Assistance: 5: Supervision Ambulation Distance  (Feet): 150 Feet Assistive device: Rolling walker Ambulation/Gait Assistance Details: Occasional verbal cueing for upright trunk posture.   Gait Pattern: Trunk flexed Gait velocity: WFl Stairs: Yes Stairs Assistance: 4: Min assist Stairs Assistance Details (indicate cue type and reason): spouse assist to steady and position walker Verbal cues(by spouses)  for gait sequencing  Stair Management Technique: No rails;Step to pattern;Backwards;With walker;Other (comment) Number of Stairs: 3     Exercises     PT Diagnosis:    PT Problem List:   PT Treatment Interventions:     PT Goals Acute Rehab PT Goals PT Goal Formulation: With patient/family Time For Goal Achievement: 03/16/12 Potential to Achieve Goals: Good Pt will Roll Supine to Right Side: with min assist PT Goal: Rolling Supine to Right Side - Progress: Met Pt will go Supine/Side to Sit: with min assist;with HOB 0 degrees PT Goal: Supine/Side to Sit - Progress: Met Pt will go Sit to Supine/Side: with modified independence PT Goal: Sit to Supine/Side - Progress: Met Pt will go Sit to Stand: with modified independence PT Goal: Sit to Stand - Progress: Met Pt will go Stand to Sit: with modified independence PT Goal: Stand to Sit - Progress: Met Pt will Ambulate: 51 - 150 feet;with modified independence;with least restrictive assistive device PT Goal: Ambulate - Progress: Met Pt will Go Up / Down Stairs: 1-2 stairs;with min assist;with least restrictive assistive device PT Goal: Up/Down Stairs - Progress: Met Additional Goals Additional Goal #1: Pt will verbalize and demonstrate 3/3 back precautions.  PT Goal: Additional Goal #1 - Progress: Progressing toward goal  Visit Information  Last PT Received On: 03/10/12 Assistance  Needed: +1    Subjective Data  Subjective: I have been sleepy all morning.  Pain pill must be too strong.  Patient Stated Goal: none stated.    Cognition  Overall Cognitive Status: Appears within  functional limits for tasks assessed/performed Arousal/Alertness: Awake/alert Orientation Level: Appears intact for tasks assessed Behavior During Session: [facility] for tasks performed    Balance     End of Session PT - End of Session Equipment Utilized During Treatment: Gait belt;Back brace Activity Tolerance: Patient tolerated treatment well;Other (comment) (C/o nausea and lethergy.) Patient left: Other (comment);with family/visitor present (Pt in bathroom) Nurse Communication: Mobility status;Other (comment) (request for assistance dressing.)   GP     Zakhari Fogel 03/10/2012, 12:46 PM Panagiotis Oelkers L. Avi Archuleta DPT [phone]

## 2012-03-12 ENCOUNTER — Encounter (HOSPITAL_COMMUNITY): Payer: Self-pay | Admitting: Orthopedic Surgery

## 2012-06-25 ENCOUNTER — Encounter (INDEPENDENT_AMBULATORY_CARE_PROVIDER_SITE_OTHER): Payer: Self-pay | Admitting: General Surgery

## 2012-06-25 ENCOUNTER — Ambulatory Visit (INDEPENDENT_AMBULATORY_CARE_PROVIDER_SITE_OTHER): Payer: Medicare Other | Admitting: General Surgery

## 2012-06-25 VITALS — BP 122/68 | HR 66 | Temp 98.2°F | Resp 12 | Ht 62.0 in | Wt 139.2 lb

## 2012-06-25 DIAGNOSIS — C50419 Malignant neoplasm of upper-outer quadrant of unspecified female breast: Secondary | ICD-10-CM

## 2012-06-25 DIAGNOSIS — Z853 Personal history of malignant neoplasm of breast: Secondary | ICD-10-CM

## 2012-06-25 DIAGNOSIS — C50412 Malignant neoplasm of upper-outer quadrant of left female breast: Secondary | ICD-10-CM

## 2012-06-25 DIAGNOSIS — C50919 Malignant neoplasm of unspecified site of unspecified female breast: Secondary | ICD-10-CM

## 2012-06-25 DIAGNOSIS — C50912 Malignant neoplasm of unspecified site of left female breast: Secondary | ICD-10-CM

## 2012-06-25 NOTE — Patient Instructions (Signed)
IF YOU ARE TAKING ASPIRIN, COUMADIN/WARFARIN, PLAVIX, OR OTHER BLOOD THINNER, PLEASE LET us KNOW IMMEDIATELY.  WE WILL NEED TO DISCUSS WITH THE PRESCRIBING PROVIDER IF THESE ARE SAFE TO STOP. IF THESE ARE NOT STOPPED AT THE APPROPRIATE TIME, THIS WILL RESULT IN A DELAY FOR YOUR SURGERY.  DO NOT TAKE THESE MEDICATIONS OR IBUPROFEN/NAPROXEN WITHIN A WEEK BEFORE SURGERY.   The main risks of surgery are bleeding, infection, damage to other structures, and seroma (accumulation of fluid) under the incision site(s).    These complications may lead to additional procedures such as drainage of seroma/infection.  If cancer is found, you may need other surgeries to obtain negative margins or to take more lymph nodes.   Most women do accumulate fluid in the breast cavity where the specimen was removed. We leave a drain in place to decrease this chance.  We do provide patients with a Breast Binder.  The purpose of this is to avoid the use of tape on the sensitive tissue of the breast and to provide some compression to minimize the risk of seroma.  If the binder is uncomfortable, you may find that a tank top with a built-in shelf bra or a loose sports bra works better for you.  I recommend wearing this around the clock for the first 1-2 weeks except in the shower.    You may remove your dressings and may shower 48 hours after surgery IF YOU DO NOT HAVE A DRAIN.    Many patients have some constipation in the week after surgery due to the narcotics and anesthesia.  You may need over the counter stool softeners or laxatives if you experience difficulty having bowel movements.    If the following occur, call our office at [phone]: If you have a fever over 101 or pain that is severe despite narcotics. If you have redness or drainage at the wound. If you develop persistent nausea or vomiting.  I will follow you back up in 1-4 weeks.    Please submit any paperwork about time off work/insurance forms to the  front desk.

## 2012-06-26 DIAGNOSIS — C50419 Malignant neoplasm of upper-outer quadrant of unspecified female breast: Secondary | ICD-10-CM | POA: Insufficient documentation

## 2012-06-26 DIAGNOSIS — Z853 Personal history of malignant neoplasm of breast: Secondary | ICD-10-CM | POA: Insufficient documentation

## 2012-06-26 NOTE — Assessment & Plan Note (Signed)
Will get breast prognostic profile results.  Discussed breast conservation vs mastectomy with patient.  Despite this being very small cancer, she does not want to undergo radiation, and since she already had right mastectomy, she just wants to do the other side symmetrically.  She declined referral to plastic surgeon.  She will also need sentinel lymph node biopsy.    The surgical procedure was described to the patient.  I discussed the incision type and location and that we would need radiology involved on the day of surgery with an injection.  She has had this before, and understood.        The risks and benefits of the procedure were described to the patient and he/she wishes to proceed.    We discussed the risks bleeding, infection, damage to other structures, need for further procedures/surgeries.  We discussed the risk of seroma.  The patient was advised that she may need further surgery if positive lymph nodes are found.  We discussed risk of lymphedema.   The patient was advised that these are the most common complications, but that others can occur as well.  They were advised against taking aspirin or other anti-inflammatory agents/blood thinners the week before surgery.

## 2012-06-26 NOTE — Progress Notes (Signed)
Chief Complaint  Patient presents with  . Breast Cancer    HISTORY: Pt is a 77 yo F who had right sided Stage I breast cancer in 1999 and was treated with mastectomy and SLN bx at that time by Dr. Hassell Done.  She is on Evista, but has not taken other antiestrogen medications.  She has kept up with left sided mammograms, and now has small 8 mm cancer at 2:30.  Biopsies demonstrated invasive mammary carcinoma.  Prognostic panel was pending at time of evaluation.  She has not felt a mass or been symptomatic with the left breast.  She denied other new health problems.  She is referred by her oncologist.  I operated on a family friend.    Past Medical History  Diagnosis Date  . Arthritis   . Hypertension   . Cancer 2009    Past Surgical History  Procedure Laterality Date  . Rotator cuff repair    . Knee surgery    . Eye surgery    . Elbow surgery    . Foot surgery    . Mastectomy  2009    Right  . Decompressive lumbar laminectomy level 2  03/08/2012    Procedure: DECOMPRESSIVE LUMBAR LAMINECTOMY LEVEL 2;  Surgeon: Melina Schools, MD;  Location: [place];  Service: Orthopedics;  Laterality: N/A;  Decompression L3-L5     Current Outpatient Prescriptions  Medication Sig Dispense Refill  . amLODipine (NORVASC) 5 MG tablet Take 5 mg by mouth daily.      . cholecalciferol (VITAMIN D) 1000 UNITS tablet Take 1,000 Units by mouth daily.      . clorazepate (TRANXENE) 7.5 MG tablet Take 7.5 mg by mouth at bedtime.       . cyclobenzaprine (FLEXERIL) 10 MG tablet Take 1 tablet (10 mg total) by mouth at bedtime.  60 tablet  0  . docusate sodium (COLACE) 100 MG capsule Take 1 capsule (100 mg total) by mouth 2 (two) times daily as needed for constipation.  20 capsule  0  . gabapentin (NEURONTIN) 400 MG capsule Take 400 mg by mouth daily.      [name] GLUCOSAMINE PO Take 1 Can by mouth daily.      [name] HYDROcodone-acetaminophen (NORCO) 10-325 MG per tablet Take 1 tablet by mouth every 6 (six) hours as needed for  pain. For pain  60 tablet  0  . metoprolol (LOPRESSOR) 50 MG tablet Take 100 mg by mouth 2 (two) times daily.       . Misc Natural Products (LUTEIN 20 PO) Take 1 tablet by mouth daily.      . ondansetron (ZOFRAN) 4 MG tablet Take 1 tablet (4 mg total) by mouth every 8 (eight) hours as needed for nausea.  20 tablet  0  . predniSONE (DELTASONE) 5 MG tablet Take 5 mg by mouth daily.         No current facility-administered medications for this visit.     Allergies  Allergen Reactions  . Codeine Nausea Only     History reviewed. No pertinent family history.   History   Social History  . Marital Status: Married    Spouse Name: N/A    Number of Children: N/A  . Years of Education: N/A   Social History Main Topics  . Smoking status: Never Smoker   . Smokeless tobacco: None  . Alcohol Use: No  . Drug Use: No  . Sexually Active: None   Other Topics Concern  . None  Social History Narrative  . None     REVIEW OF SYSTEMS - PERTINENT POSITIVES ONLY: 12 point review of systems negative other than HPI and PMH  EXAM: Filed Vitals:   06/25/12 1416  BP: 122/68  Pulse: 66  Temp: 98.2 F (36.8 C)  Resp: 12    Gen:  No acute distress.  Well nourished and well groomed.   Neurological: Alert and oriented to person, place, and time. Coordination normal.  Head: Normocephalic and atraumatic.  Eyes: Conjunctivae are normal. Pupils are equal, round, and reactive to light. No scleral icterus.  Neck: Normal range of motion. Neck supple. No tracheal deviation or thyromegaly present.  Cardiovascular: Normal rate, regular rhythm, normal heart sounds and intact distal pulses.  Exam reveals no gallop and no friction rub.  No murmur heard. Respiratory: Effort normal.  No respiratory distress. No chest wall tenderness. Breath sounds normal.  No wheezes, rales or rhonchi.  Breast:  Right breast surgically absent.  No chest wall masses or right adenopathy is found.  The left breast is tender  at biopsy site.  No masses, skin dimpling, nipple retraction, nipple discharge are found.  No left axillary adenopathy is appreciated.   GI: Soft. Bowel sounds are normal. The abdomen is soft and nontender.  There is no rebound and no guarding.  Musculoskeletal: Normal range of motion. Extremities are nontender.  Lymphadenopathy: No cervical, preauricular, postauricular or axillary adenopathy is present Skin: Skin is warm and dry. No rash noted. No diaphoresis. No erythema. No pallor. No clubbing, cyanosis, or edema.   Psychiatric: Normal mood and affect. Behavior is normal. Judgment and thought content normal.     RADIOLOGY RESULTS: See E-Chart or I-Site for most recent results.  Images and reports are reviewed. Mammograms/ultrasound demonstrate 8x7x7 mm lesion at 2:30, 10 cm from nipple, posteriorly located.    No chest wall involvement/ no concerning LN   ASSESSMENT AND PLAN: Cancer of upper-outer quadrant of LEFT female breast Will get breast prognostic profile results.  Discussed breast conservation vs mastectomy with patient.  Despite this being very small cancer, she does not want to undergo radiation, and since she already had right mastectomy, she just wants to do the other side symmetrically.  She declined referral to plastic surgeon.  She will also need sentinel lymph node biopsy.    The surgical procedure was described to the patient.  I discussed the incision type and location and that we would need radiology involved on the day of surgery with an injection.  She has had this before, and understood.        The risks and benefits of the procedure were described to the patient and he/she wishes to proceed.    We discussed the risks bleeding, infection, damage to other structures, need for further procedures/surgeries.  We discussed the risk of seroma.  The patient was advised that she may need further surgery if positive lymph nodes are found.  We discussed risk of lymphedema.    The patient was advised that these are the most common complications, but that others can occur as well.  They were advised against taking aspirin or other anti-inflammatory agents/blood thinners the week before surgery.        Milus Height MD Surgical Oncology, General and [facility]      Visit Diagnoses: 1. Breast cancer, left   2. History of breast cancer in female   3. Cancer of upper-outer quadrant of female breast, left  Primary Care Physician: [name], MD  Hosie Poisson MD (oncology), [facility].

## 2012-06-27 ENCOUNTER — Encounter (INDEPENDENT_AMBULATORY_CARE_PROVIDER_SITE_OTHER): Payer: Self-pay

## 2012-06-27 ENCOUNTER — Encounter (HOSPITAL_BASED_OUTPATIENT_CLINIC_OR_DEPARTMENT_OTHER): Payer: Self-pay | Admitting: *Deleted

## 2012-06-27 NOTE — Progress Notes (Signed)
To come in for bmet-had back surgery 12/13-ekg and cxr done then ,no cardiac problems No resp She has had rt mastectomy here-2009-did well To bring all meds and overnight bag

## 2012-06-28 ENCOUNTER — Encounter (HOSPITAL_BASED_OUTPATIENT_CLINIC_OR_DEPARTMENT_OTHER)
Admission: RE | Admit: 2012-06-28 | Discharge: 2012-06-28 | Disposition: A | Discharge status: Home / Self Care | Payer: Medicare Other | Source: Ambulatory Visit | Attending: General Surgery | Admitting: General Surgery

## 2012-06-28 DIAGNOSIS — Z01812 Encounter for preprocedural laboratory examination: Secondary | ICD-10-CM | POA: Insufficient documentation

## 2012-06-28 DIAGNOSIS — Z01818 Encounter for other preprocedural examination: Secondary | ICD-10-CM | POA: Insufficient documentation

## 2012-06-28 LAB — BASIC METABOLIC PANEL
CO2: 29 mEq/L (ref 19–32)
Calcium: 9.2 mg/dL (ref 8.4–10.5)
Chloride: 106 mEq/L (ref 96–112)
Creatinine, Ser: 0.77 mg/dL (ref 0.50–1.10)
Glucose, Bld: 109 mg/dL — ABNORMAL HIGH (ref 70–99)

## 2012-06-29 ENCOUNTER — Encounter (HOSPITAL_BASED_OUTPATIENT_CLINIC_OR_DEPARTMENT_OTHER): Payer: Medicare Other

## 2012-07-04 ENCOUNTER — Ambulatory Visit (HOSPITAL_BASED_OUTPATIENT_CLINIC_OR_DEPARTMENT_OTHER): Payer: Medicare Other | Admitting: *Deleted

## 2012-07-04 ENCOUNTER — Encounter (HOSPITAL_COMMUNITY)
Admission: RE | Admit: 2012-07-04 | Discharge: 2012-07-04 | Disposition: A | Discharge status: Home / Self Care | Payer: Medicare Other | Source: Ambulatory Visit | Attending: General Surgery | Admitting: General Surgery

## 2012-07-04 ENCOUNTER — Encounter (HOSPITAL_BASED_OUTPATIENT_CLINIC_OR_DEPARTMENT_OTHER): Payer: Self-pay | Admitting: *Deleted

## 2012-07-04 ENCOUNTER — Ambulatory Visit (HOSPITAL_BASED_OUTPATIENT_CLINIC_OR_DEPARTMENT_OTHER)
Admission: RE | Admit: 2012-07-04 | Discharge: 2012-07-05 | Disposition: A | Discharge status: Home / Self Care | Payer: Medicare Other | Source: Ambulatory Visit | Attending: General Surgery | Admitting: General Surgery

## 2012-07-04 ENCOUNTER — Encounter (HOSPITAL_BASED_OUTPATIENT_CLINIC_OR_DEPARTMENT_OTHER)
Admission: RE | Disposition: A | Discharge status: Home / Self Care | Payer: Self-pay | Source: Ambulatory Visit | Attending: General Surgery

## 2012-07-04 DIAGNOSIS — I10 Essential (primary) hypertension: Secondary | ICD-10-CM | POA: Insufficient documentation

## 2012-07-04 DIAGNOSIS — M129 Arthropathy, unspecified: Secondary | ICD-10-CM | POA: Insufficient documentation

## 2012-07-04 DIAGNOSIS — Z853 Personal history of malignant neoplasm of breast: Secondary | ICD-10-CM

## 2012-07-04 DIAGNOSIS — D0590 Unspecified type of carcinoma in situ of unspecified breast: Secondary | ICD-10-CM

## 2012-07-04 DIAGNOSIS — C50412 Malignant neoplasm of upper-outer quadrant of left female breast: Secondary | ICD-10-CM

## 2012-07-04 DIAGNOSIS — C50419 Malignant neoplasm of upper-outer quadrant of unspecified female breast: Secondary | ICD-10-CM | POA: Insufficient documentation

## 2012-07-04 DIAGNOSIS — C50912 Malignant neoplasm of unspecified site of left female breast: Secondary | ICD-10-CM

## 2012-07-04 HISTORY — PX: MASTECTOMY W/ SENTINEL NODE BIOPSY: SHX2001

## 2012-07-04 HISTORY — DX: Anxiety disorder, unspecified: F41.9

## 2012-07-04 LAB — POCT HEMOGLOBIN-HEMACUE: Hemoglobin: 11.5 g/dL — ABNORMAL LOW (ref 12.0–15.0)

## 2012-07-04 SURGERY — MASTECTOMY WITH SENTINEL LYMPH NODE BIOPSY
Anesthesia: General | Site: Breast | Laterality: Left | Wound class: Clean

## 2012-07-04 MED ORDER — OXYCODONE HCL 5 MG PO TABS: 5.0000 mg | ORAL_TABLET | Freq: Once | ORAL | Status: DC | PRN

## 2012-07-04 MED ORDER — ONDANSETRON HCL 4 MG/2ML IJ SOLN: 4.0000 mg | Freq: Four times a day (QID) | INTRAMUSCULAR | Status: DC | PRN

## 2012-07-04 MED ORDER — CLONAZEPAM 0.5 MG PO TABS: 0.5000 mg | ORAL_TABLET | Freq: Every evening | ORAL | Status: DC | PRN

## 2012-07-04 MED ORDER — GLYCOPYRROLATE 0.2 MG/ML IJ SOLN
INTRAMUSCULAR | Status: DC | PRN
  Administered 2012-07-04: 0.2 mg via INTRAVENOUS

## 2012-07-04 MED ORDER — HYDROCODONE-ACETAMINOPHEN 10-325 MG PO TABS: 1.0000 | ORAL_TABLET | Freq: Four times a day (QID) | ORAL | Status: DC | PRN

## 2012-07-04 MED ORDER — LACTATED RINGERS IV SOLN
INTRAVENOUS | Status: DC
  Administered 2012-07-04 (×2): via INTRAVENOUS

## 2012-07-04 MED ORDER — LIDOCAINE HCL (CARDIAC) 20 MG/ML IV SOLN
INTRAVENOUS | Status: DC | PRN
  Administered 2012-07-04: 60 mg via INTRAVENOUS

## 2012-07-04 MED ORDER — DEXAMETHASONE SODIUM PHOSPHATE 4 MG/ML IJ SOLN
INTRAMUSCULAR | Status: DC | PRN
  Administered 2012-07-04: 4 mg via INTRAVENOUS

## 2012-07-04 MED ORDER — MIDAZOLAM HCL 2 MG/2ML IJ SOLN
1.0000 mg | INTRAMUSCULAR | Status: DC | PRN
  Administered 2012-07-04: 2 mg via INTRAVENOUS

## 2012-07-04 MED ORDER — ONDANSETRON HCL 4 MG PO TABS: 4.0000 mg | ORAL_TABLET | Freq: Four times a day (QID) | ORAL | Status: DC | PRN

## 2012-07-04 MED ORDER — SODIUM CHLORIDE 0.9 % IJ SOLN
INTRAMUSCULAR | Status: DC | PRN
  Administered 2012-07-04: 50 mL

## 2012-07-04 MED ORDER — FENTANYL CITRATE 0.05 MG/ML IJ SOLN
50.0000 ug | INTRAMUSCULAR | Status: DC | PRN
  Administered 2012-07-04: 100 ug via INTRAVENOUS

## 2012-07-04 MED ORDER — ACETAMINOPHEN 10 MG/ML IV SOLN: 1000.0000 mg | Freq: Four times a day (QID) | INTRAVENOUS | Status: DC

## 2012-07-04 MED ORDER — PROPOFOL 10 MG/ML IV BOLUS
INTRAVENOUS | Status: DC | PRN
  Administered 2012-07-04: 200 mg via INTRAVENOUS

## 2012-07-04 MED ORDER — NABUMETONE 750 MG PO TABS
750.0000 mg | ORAL_TABLET | Freq: Two times a day (BID) | ORAL | Status: DC
  Administered 2012-07-04: 750 mg via ORAL

## 2012-07-04 MED ORDER — BUPIVACAINE-EPINEPHRINE 0.25% -1:200000 IJ SOLN
INTRAMUSCULAR | Status: DC | PRN
  Administered 2012-07-04: 30 mL

## 2012-07-04 MED ORDER — AMLODIPINE BESYLATE 5 MG PO TABS: 5.0000 mg | ORAL_TABLET | Freq: Every day | ORAL | Status: DC

## 2012-07-04 MED ORDER — ONDANSETRON HCL 4 MG/2ML IJ SOLN
INTRAMUSCULAR | Status: DC | PRN
  Administered 2012-07-04: 4 mg via INTRAVENOUS

## 2012-07-04 MED ORDER — HYDROMORPHONE HCL PF 1 MG/ML IJ SOLN
0.2500 mg | INTRAMUSCULAR | Status: DC | PRN
  Administered 2012-07-04 (×2): 0.25 mg via INTRAVENOUS

## 2012-07-04 MED ORDER — OXYCODONE HCL 5 MG/5ML PO SOLN: 5.0000 mg | Freq: Once | ORAL | Status: DC | PRN

## 2012-07-04 MED ORDER — MORPHINE SULFATE 2 MG/ML IJ SOLN
1.0000 mg | INTRAMUSCULAR | Status: DC | PRN
  Administered 2012-07-04: 2 mg via INTRAVENOUS

## 2012-07-04 MED ORDER — PREDNISONE 5 MG PO TABS: 5.0000 mg | ORAL_TABLET | Freq: Every day | ORAL | Status: DC

## 2012-07-04 MED ORDER — CEFAZOLIN SODIUM-DEXTROSE 2-3 GM-% IV SOLR
2.0000 g | INTRAVENOUS | Status: AC
  Administered 2012-07-04: 2 g via INTRAVENOUS

## 2012-07-04 MED ORDER — METOPROLOL TARTRATE 50 MG PO TABS
50.0000 mg | ORAL_TABLET | Freq: Two times a day (BID) | ORAL | Status: DC
  Administered 2012-07-04: 50 mg via ORAL

## 2012-07-04 MED ORDER — CEFAZOLIN SODIUM 1-5 GM-% IV SOLN
1.0000 g | Freq: Four times a day (QID) | INTRAVENOUS | Status: DC
  Administered 2012-07-04 – 2012-07-05 (×2): 1 g via INTRAVENOUS

## 2012-07-04 MED ORDER — CEFAZOLIN SODIUM 1-5 GM-% IV SOLN: 1.0000 g | Freq: Four times a day (QID) | INTRAVENOUS | Status: DC

## 2012-07-04 MED ORDER — TECHNETIUM TC 99M SULFUR COLLOID FILTERED
1.0000 | Freq: Once | INTRAVENOUS | Status: AC | PRN
  Administered 2012-07-04: 1 via INTRADERMAL

## 2012-07-04 MED ORDER — ONDANSETRON HCL 4 MG/2ML IJ SOLN: 4.0000 mg | Freq: Once | INTRAMUSCULAR | Status: DC | PRN

## 2012-07-04 MED ORDER — FENTANYL CITRATE 0.05 MG/ML IJ SOLN
INTRAMUSCULAR | Status: DC | PRN
  Administered 2012-07-04: 25 ug via INTRAVENOUS
  Administered 2012-07-04: 50 ug via INTRAVENOUS
  Administered 2012-07-04: 25 ug via INTRAVENOUS

## 2012-07-04 MED ORDER — DOCUSATE SODIUM 100 MG PO CAPS: 100.0000 mg | ORAL_CAPSULE | Freq: Two times a day (BID) | ORAL | Status: DC

## 2012-07-04 MED ORDER — CLORAZEPATE DIPOTASSIUM 7.5 MG PO TABS: 7.5000 mg | ORAL_TABLET | Freq: Every day | ORAL | Status: DC

## 2012-07-04 MED ORDER — KCL IN DEXTROSE-NACL 20-5-0.45 MEQ/L-%-% IV SOLN
INTRAVENOUS | Status: DC
  Administered 2012-07-04: 19:00:00 via INTRAVENOUS

## 2012-07-04 MED ORDER — HYDROCODONE-ACETAMINOPHEN 5-325 MG PO TABS
2.0000 | ORAL_TABLET | ORAL | Status: DC | PRN
  Administered 2012-07-04 (×2): 1 via ORAL
  Administered 2012-07-05 (×2): 2 via ORAL

## 2012-07-04 MED ORDER — CYCLOBENZAPRINE HCL 10 MG PO TABS: 10.0000 mg | ORAL_TABLET | Freq: Every day | ORAL | Status: DC

## 2012-07-04 MED ORDER — GABAPENTIN 400 MG PO CAPS: 400.0000 mg | ORAL_CAPSULE | Freq: Every day | ORAL | Status: DC

## 2012-07-04 MED ORDER — SODIUM CHLORIDE 0.9 % IJ SOLN
INTRAMUSCULAR | Status: DC | PRN
  Administered 2012-07-04: 14:00:00 via INTRAMUSCULAR

## 2012-07-04 MED ORDER — ACETAMINOPHEN 10 MG/ML IV SOLN
1000.0000 mg | Freq: Once | INTRAVENOUS | Status: AC
  Administered 2012-07-04: 1000 mg via INTRAVENOUS

## 2012-07-04 SURGICAL SUPPLY — 68 items
BAG DECANTER FOR FLEXI CONT (MISCELLANEOUS) IMPLANT
BINDER BREAST LRG (GAUZE/BANDAGES/DRESSINGS) IMPLANT
BINDER BREAST MEDIUM (GAUZE/BANDAGES/DRESSINGS) IMPLANT
BINDER BREAST XLRG (GAUZE/BANDAGES/DRESSINGS) IMPLANT
BINDER BREAST XXLRG (GAUZE/BANDAGES/DRESSINGS) IMPLANT
BIOPATCH RED 1 DISK 7.0 (GAUZE/BANDAGES/DRESSINGS) ×4 IMPLANT
BLADE HEX COATED 2.75 (ELECTRODE) ×2 IMPLANT
BLADE SURG 10 STRL SS (BLADE) ×2 IMPLANT
BLADE SURG 15 STRL LF DISP TIS (BLADE) ×1 IMPLANT
BLADE SURG 15 STRL SS (BLADE) ×1
CANISTER SUCTION 1200CC (MISCELLANEOUS) ×2 IMPLANT
CHLORAPREP W/TINT 26ML (MISCELLANEOUS) ×2 IMPLANT
CLIP TI LARGE 6 (CLIP) IMPLANT
CLIP TI MEDIUM 6 (CLIP) ×2 IMPLANT
CLIP TI WIDE RED SMALL 6 (CLIP) ×2 IMPLANT
CLOTH BEACON ORANGE TIMEOUT ST (SAFETY) ×2 IMPLANT
COVER MAYO STAND STRL (DRAPES) ×2 IMPLANT
COVER PROBE W GEL 5X96 (DRAPES) ×2 IMPLANT
DECANTER SPIKE VIAL GLASS SM (MISCELLANEOUS) IMPLANT
DERMABOND ADVANCED (GAUZE/BANDAGES/DRESSINGS) ×1
DERMABOND ADVANCED .7 DNX12 (GAUZE/BANDAGES/DRESSINGS) ×1 IMPLANT
DRAIN CHANNEL 19F RND (DRAIN) ×4 IMPLANT
DRAPE UTILITY XL STRL (DRAPES) ×2 IMPLANT
DRSG PAD ABDOMINAL 8X10 ST (GAUZE/BANDAGES/DRESSINGS) ×4 IMPLANT
ELECT REM PT RETURN 9FT ADLT (ELECTROSURGICAL) ×2
ELECTRODE REM PT RTRN 9FT ADLT (ELECTROSURGICAL) ×1 IMPLANT
EVACUATOR SILICONE 100CC (DRAIN) ×4 IMPLANT
GLOVE BIO SURGEON STRL SZ 6 (GLOVE) ×2 IMPLANT
GLOVE BIO SURGEON STRL SZ 6.5 (GLOVE) ×2 IMPLANT
GLOVE BIOGEL PI IND STRL 6.5 (GLOVE) ×1 IMPLANT
GLOVE BIOGEL PI IND STRL 7.0 (GLOVE) ×1 IMPLANT
GLOVE BIOGEL PI INDICATOR 6.5 (GLOVE) ×1
GLOVE BIOGEL PI INDICATOR 7.0 (GLOVE) ×1
GOWN PREVENTION PLUS XLARGE (GOWN DISPOSABLE) ×2 IMPLANT
GOWN PREVENTION PLUS XXLARGE (GOWN DISPOSABLE) ×2 IMPLANT
NDL SAFETY ECLIPSE 18X1.5 (NEEDLE) ×1 IMPLANT
NEEDLE HYPO 18GX1.5 SHARP (NEEDLE) ×1
NEEDLE HYPO 25X1 1.5 SAFETY (NEEDLE) ×4 IMPLANT
NEEDLE SPNL 22GX3.5 QUINCKE BK (NEEDLE) ×2 IMPLANT
NS IRRIG 1000ML POUR BTL (IV SOLUTION) ×2 IMPLANT
PACK BASIN DAY SURGERY FS (CUSTOM PROCEDURE TRAY) ×2 IMPLANT
PACK UNIVERSAL I (CUSTOM PROCEDURE TRAY) ×2 IMPLANT
PENCIL BUTTON HOLSTER BLD 10FT (ELECTRODE) ×2 IMPLANT
PIN SAFETY STERILE (MISCELLANEOUS) ×2 IMPLANT
SLEEVE SCD COMPRESS KNEE MED (MISCELLANEOUS) ×2 IMPLANT
SPONGE GAUZE 4X4 12PLY (GAUZE/BANDAGES/DRESSINGS) ×2 IMPLANT
SPONGE LAP 18X18 X RAY DECT (DISPOSABLE) ×2 IMPLANT
STAPLER VISISTAT 35W (STAPLE) ×2 IMPLANT
STOCKINETTE IMPERVIOUS LG (DRAPES) ×2 IMPLANT
STRIP CLOSURE SKIN 1/2X4 (GAUZE/BANDAGES/DRESSINGS) ×2 IMPLANT
SUT ETHILON 2 0 FS 18 (SUTURE) ×4 IMPLANT
SUT ETHILON 3 0 PS 1 (SUTURE) IMPLANT
SUT MNCRL AB 4-0 PS2 18 (SUTURE) ×4 IMPLANT
SUT MON AB 3-0 SH 27 (SUTURE) ×6
SUT MON AB 3-0 SH27 (SUTURE) ×6 IMPLANT
SUT SILK 0 TIES 10X30 (SUTURE) ×2 IMPLANT
SUT SILK 2 0 SH (SUTURE) IMPLANT
SUT VICRYL 3-0 CR8 SH (SUTURE) IMPLANT
SUT VICRYL 4-0 PS2 18IN ABS (SUTURE) IMPLANT
SUT VICRYL AB 2 0 TIE (SUTURE) ×1 IMPLANT
SUT VICRYL AB 2 0 TIES (SUTURE) ×1
SYR 50ML LL SCALE MARK (SYRINGE) ×2 IMPLANT
SYR CONTROL 10ML LL (SYRINGE) ×4 IMPLANT
TOWEL OR 17X24 6PK STRL BLUE (TOWEL DISPOSABLE) ×2 IMPLANT
TOWEL OR NON WOVEN STRL DISP B (DISPOSABLE) ×2 IMPLANT
TUBE CONNECTING 20X1/4 (TUBING) ×2 IMPLANT
UNDERPAD 30X30 INCONTINENT (UNDERPADS AND DIAPERS) ×2 IMPLANT
YANKAUER SUCT BULB TIP NO VENT (SUCTIONS) ×2 IMPLANT

## 2012-07-04 NOTE — H&P (View-Only) (Signed)
Chief Complaint  Patient presents with  . Breast Cancer    HISTORY: Pt is a 77 yo F who had right sided Stage I breast cancer in 1999 and was treated with mastectomy and SLN bx at that time by Dr. Hassell Done.  She is on Evista, but has not taken other antiestrogen medications.  She has kept up with left sided mammograms, and now has small 8 mm cancer at 2:30.  Biopsies demonstrated invasive mammary carcinoma.  Prognostic panel was pending at time of evaluation.  She has not felt a mass or been symptomatic with the left breast.  She denied other new health problems.  She is referred by her oncologist.  I operated on a family friend.    Past Medical History  Diagnosis Date  . Arthritis   . Hypertension   . Cancer 2009    Past Surgical History  Procedure Laterality Date  . Rotator cuff repair    . Knee surgery    . Eye surgery    . Elbow surgery    . Foot surgery    . Mastectomy  2009    Right  . Decompressive lumbar laminectomy level 2  03/08/2012    Procedure: DECOMPRESSIVE LUMBAR LAMINECTOMY LEVEL 2;  Surgeon: Melina Schools, MD;  Location: [place];  Service: Orthopedics;  Laterality: N/A;  Decompression L3-L5     Current Outpatient Prescriptions  Medication Sig Dispense Refill  . amLODipine (NORVASC) 5 MG tablet Take 5 mg by mouth daily.      . cholecalciferol (VITAMIN D) 1000 UNITS tablet Take 1,000 Units by mouth daily.      . clorazepate (TRANXENE) 7.5 MG tablet Take 7.5 mg by mouth at bedtime.       . cyclobenzaprine (FLEXERIL) 10 MG tablet Take 1 tablet (10 mg total) by mouth at bedtime.  60 tablet  0  . docusate sodium (COLACE) 100 MG capsule Take 1 capsule (100 mg total) by mouth 2 (two) times daily as needed for constipation.  20 capsule  0  . gabapentin (NEURONTIN) 400 MG capsule Take 400 mg by mouth daily.      [name] GLUCOSAMINE PO Take 1 Can by mouth daily.      [name] HYDROcodone-acetaminophen (NORCO) 10-325 MG per tablet Take 1 tablet by mouth every 6 (six) hours as needed for  pain. For pain  60 tablet  0  . metoprolol (LOPRESSOR) 50 MG tablet Take 100 mg by mouth 2 (two) times daily.       . Misc Natural Products (LUTEIN 20 PO) Take 1 tablet by mouth daily.      . ondansetron (ZOFRAN) 4 MG tablet Take 1 tablet (4 mg total) by mouth every 8 (eight) hours as needed for nausea.  20 tablet  0  . predniSONE (DELTASONE) 5 MG tablet Take 5 mg by mouth daily.         No current facility-administered medications for this visit.     Allergies  Allergen Reactions  . Codeine Nausea Only     History reviewed. No pertinent family history.   History   Social History  . Marital Status: Married    Spouse Name: N/A    Number of Children: N/A  . Years of Education: N/A   Social History Main Topics  . Smoking status: Never Smoker   . Smokeless tobacco: None  . Alcohol Use: No  . Drug Use: No  . Sexually Active: None   Other Topics Concern  . None  Social History Narrative  . None     REVIEW OF SYSTEMS - PERTINENT POSITIVES ONLY: 12 point review of systems negative other than HPI and PMH  EXAM: Filed Vitals:   06/25/12 1416  BP: 122/68  Pulse: 66  Temp: 98.2 F (36.8 C)  Resp: 12    Gen:  No acute distress.  Well nourished and well groomed.   Neurological: Alert and oriented to person, place, and time. Coordination normal.  Head: Normocephalic and atraumatic.  Eyes: Conjunctivae are normal. Pupils are equal, round, and reactive to light. No scleral icterus.  Neck: Normal range of motion. Neck supple. No tracheal deviation or thyromegaly present.  Cardiovascular: Normal rate, regular rhythm, normal heart sounds and intact distal pulses.  Exam reveals no gallop and no friction rub.  No murmur heard. Respiratory: Effort normal.  No respiratory distress. No chest wall tenderness. Breath sounds normal.  No wheezes, rales or rhonchi.  Breast:  Right breast surgically absent.  No chest wall masses or right adenopathy is found.  The left breast is tender  at biopsy site.  No masses, skin dimpling, nipple retraction, nipple discharge are found.  No left axillary adenopathy is appreciated.   GI: Soft. Bowel sounds are normal. The abdomen is soft and nontender.  There is no rebound and no guarding.  Musculoskeletal: Normal range of motion. Extremities are nontender.  Lymphadenopathy: No cervical, preauricular, postauricular or axillary adenopathy is present Skin: Skin is warm and dry. No rash noted. No diaphoresis. No erythema. No pallor. No clubbing, cyanosis, or edema.   Psychiatric: Normal mood and affect. Behavior is normal. Judgment and thought content normal.     RADIOLOGY RESULTS: See E-Chart or I-Site for most recent results.  Images and reports are reviewed. Mammograms/ultrasound demonstrate 8x7x7 mm lesion at 2:30, 10 cm from nipple, posteriorly located.    No chest wall involvement/ no concerning LN   ASSESSMENT AND PLAN: Cancer of upper-outer quadrant of LEFT female breast Will get breast prognostic profile results.  Discussed breast conservation vs mastectomy with patient.  Despite this being very small cancer, she does not want to undergo radiation, and since she already had right mastectomy, she just wants to do the other side symmetrically.  She declined referral to plastic surgeon.  She will also need sentinel lymph node biopsy.    The surgical procedure was described to the patient.  I discussed the incision type and location and that we would need radiology involved on the day of surgery with an injection.  She has had this before, and understood.        The risks and benefits of the procedure were described to the patient and he/she wishes to proceed.    We discussed the risks bleeding, infection, damage to other structures, need for further procedures/surgeries.  We discussed the risk of seroma.  The patient was advised that she may need further surgery if positive lymph nodes are found.  We discussed risk of lymphedema.    The patient was advised that these are the most common complications, but that others can occur as well.  They were advised against taking aspirin or other anti-inflammatory agents/blood thinners the week before surgery.        Milus Height MD Surgical Oncology, General and [facility]      Visit Diagnoses: 1. Breast cancer, left   2. History of breast cancer in female   3. Cancer of upper-outer quadrant of female breast, left  Primary Care Physician: [name], MD  Hosie Poisson MD (oncology), [facility].

## 2012-07-04 NOTE — Transfer of Care (Signed)
Immediate Anesthesia Transfer of Care Note  Patient: Victoria Moran  Procedure(s) Performed: Procedure(s): MASTECTOMY WITH SENTINEL LYMPH NODE BIOPSY (Left)  Patient Location: PACU  Anesthesia Type:General  Level of Consciousness: sedated  Airway & Oxygen Therapy: Patient Spontanous Breathing and Patient connected to face mask oxygen  Post-op Assessment: Report given to PACU RN and Post -op Vital signs reviewed and stable  Post vital signs: Reviewed and stable  Complications: No apparent anesthesia complications

## 2012-07-04 NOTE — Progress Notes (Signed)
Dr Al Corpus ok with IV start in right hand

## 2012-07-04 NOTE — Op Note (Signed)
Left Mastectomy with Sentinel Node Biopsy Procedure Note  Indications: This patient presents with history of left breast cancer with clinically negative axillary lymph node exam.  Pre-operative Diagnosis: right breast cancer, [id number]   Post-operative Diagnosis: right breast cancer, same  Surgeon: Stark Klein   Anesthesia: General endotracheal anesthesia and Local anesthesia 0.25.% bupivacaine, with epinephrine  ASA Class: 2  Procedure Details  The patient was seen in the Holding Room. The risks, benefits, complications, treatment options, and expected outcomes were discussed with the patient. The possibilities of reaction to medication, pulmonary aspiration, bleeding, infection, the need for additional procedures, failure to diagnose a condition, and creating a complication requiring transfusion or operation were discussed with the patient. The patient concurred with the proposed plan, giving informed consent.  The site of surgery properly noted/marked. The patient was taken to Operating Room # 2, identified as Evonnie Dawes and the procedure verified as Left Mastectomy and Sentinel Node Biopsy. A Time Out was held and the above information confirmed.  The methylene blue was injected in the subareolar location.    After induction of anesthesia, the right arm, breast, and chest were prepped and draped in standard fashion.   The borders of the breast were identified and marked.  The incisions of the breast was drawn out to make sure incision lines were equidistant in length. The superior incision was made with the PlasmaBlade.  The marcaine/saline mixture was infiltrated into the superior flap.  Mastectomy hooks were used to provide elevation of the skin edges, and the PlasmaBlade was used to create the mastectomy flaps.  The dissection was taken to the fascia of the pectoralis major.  The penetrating vessels were cauterized  The superior flap was taken medially to the lateral sternal border,  superiorly to the inferior border of the clavicle.  The inferior flap was similarly created, inferiorly to the inframammary fold and laterally to the border of the latissimus.  The breast was taken off including the pectoralis fascia and the axillary tail marked.    Using a hand-held gamma probe, axillary sentinel nodes were identified.  6 level 2 axillary sentinel nodes were removed and submitted to pathology.  The findings are below.  The lymphovascular channels were clipped with metal clips.        The wound was irrigated.    Two 19 Blake drains were placed laterally.   Hemostasis was achieved with cautery.  The wound was irrigated and closed with a 3-0 Monocryl deep dermal interrupted sutures and 4-0 Monocryl subcuticular closure in layers.    Sterile dressings were applied. At the end of the operation, all sponge, instrument, and needle counts were correct.  Findings: grossly clear surgical margins SLN#1 660, blue; #2 palpable, SLN #3 150, blue; SLN #4 cps 320; #5 cps; #6 cps 180, background cps 5   Estimated Blood Loss:  less than 25 mL         Drains: 2 19 Blakes                Specimens: L breast, additional breast tissue and 6 axillary sentinel nodes         Complications:  None; patient tolerated the procedure well.         Disposition: PACU - hemodynamically stable.         Condition: stable

## 2012-07-04 NOTE — Anesthesia Preprocedure Evaluation (Signed)
Anesthesia Evaluation  Patient identified by MRN, date of birth, ID band Patient awake    Reviewed: Allergy & Precautions, H&P , NPO status , Patient's Chart, lab work & pertinent test results, reviewed documented beta blocker date and time   Airway Mallampati: I TM Distance: >3 FB Neck ROM: Full    Dental  (+) Teeth Intact and Dental Advisory Given   Pulmonary  breath sounds clear to auscultation        Cardiovascular hypertension, Pt. on medications and Pt. on home beta blockers Rhythm:Regular Rate:Normal     Neuro/Psych    GI/Hepatic   Endo/Other    Renal/GU      Musculoskeletal   Abdominal   Peds  Hematology   Anesthesia Other Findings   Reproductive/Obstetrics                           Anesthesia Physical Anesthesia Plan  ASA: III  Anesthesia Plan: General   Post-op Pain Management:    Induction: Intravenous  Airway Management Planned: LMA  Additional Equipment:   Intra-op Plan:   Post-operative Plan: Extubation in OR  Informed Consent: I have reviewed the patients History and Physical, chart, labs and discussed the procedure including the risks, benefits and alternatives for the proposed anesthesia with the patient or authorized representative who has indicated his/her understanding and acceptance.     Plan Discussed with: CRNA, Surgeon and Anesthesiologist  Anesthesia Plan Comments:         Anesthesia Quick Evaluation

## 2012-07-04 NOTE — Interval H&P Note (Signed)
History and Physical Interval Note:  07/04/2012 1:31 PM  Victoria Moran  has presented today for surgery, with the diagnosis of left breast cancer  The various methods of treatment have been discussed with the patient and family. After consideration of risks, benefits and other options for treatment, the patient has consented to  Procedure(s): MASTECTOMY WITH SENTINEL LYMPH NODE BIOPSY (Left) as a surgical intervention .  The patient's history has been reviewed, patient examined, no change in status, stable for surgery.  I have reviewed the patient's chart and labs.  Questions were answered to the patient's satisfaction.     [name]

## 2012-07-04 NOTE — Anesthesia Procedure Notes (Signed)
Procedure Name: LMA Insertion Date/Time: 07/04/2012 1:34 PM Performed by: Maryella Shivers Pre-anesthesia Checklist: Patient identified, Emergency Drugs available, Suction available and Patient being monitored Patient Re-evaluated:Patient Re-evaluated prior to inductionOxygen Delivery Method: Circle System Utilized Preoxygenation: Pre-oxygenation with 100% oxygen Intubation Type: IV induction Ventilation: Mask ventilation without difficulty LMA: LMA inserted LMA Size: 4.0 Number of attempts: 1 Airway Equipment and Method: bite block Placement Confirmation: positive ETCO2 Tube secured with: Tape Dental Injury: Teeth and Oropharynx as per pre-operative assessment

## 2012-07-04 NOTE — Progress Notes (Signed)
Emotional support during breast injections °

## 2012-07-04 NOTE — Anesthesia Postprocedure Evaluation (Signed)
  Anesthesia Post-op Note  Patient: Victoria Moran  Procedure(s) Performed: Procedure(s): MASTECTOMY WITH SENTINEL LYMPH NODE BIOPSY (Left)  Patient Location: PACU  Anesthesia Type:General  Level of Consciousness: awake, alert  and oriented  Airway and Oxygen Therapy: Patient Spontanous Breathing and Patient connected to face mask oxygen  Post-op Pain: mild  Post-op Assessment: Post-op Vital signs reviewed  Post-op Vital Signs: Reviewed  Complications: No apparent anesthesia complications

## 2012-07-05 ENCOUNTER — Encounter (HOSPITAL_BASED_OUTPATIENT_CLINIC_OR_DEPARTMENT_OTHER): Payer: Self-pay | Admitting: General Surgery

## 2012-07-09 ENCOUNTER — Telehealth (INDEPENDENT_AMBULATORY_CARE_PROVIDER_SITE_OTHER): Payer: Self-pay | Admitting: General Surgery

## 2012-07-09 NOTE — Telephone Encounter (Signed)
Pt called for pathology results of her lymph nodes.  After consulting with BMorris, CMA, reported that all lymph nodes biopsied were negative for carcinoma.  She understands.

## 2012-07-13 ENCOUNTER — Telehealth (INDEPENDENT_AMBULATORY_CARE_PROVIDER_SITE_OTHER): Payer: Self-pay | Admitting: *Deleted

## 2012-07-13 NOTE — Telephone Encounter (Signed)
Patient called to state she is having some pain under where the lymphnodes were removed.  Encouraged patient to use Ibuprofen and ice to help with discomfort.  Patient is agreeable and states understanding at this time.  Patient will call back if symptoms continue past 24-48 hours without relief.

## 2012-07-20 ENCOUNTER — Encounter (INDEPENDENT_AMBULATORY_CARE_PROVIDER_SITE_OTHER): Payer: Self-pay | Admitting: General Surgery

## 2012-07-20 ENCOUNTER — Ambulatory Visit (INDEPENDENT_AMBULATORY_CARE_PROVIDER_SITE_OTHER): Payer: Medicare Other | Admitting: General Surgery

## 2012-07-20 VITALS — BP 134/70 | HR 60 | Temp 98.7°F | Resp 12 | Ht 62.0 in | Wt 139.0 lb

## 2012-07-20 DIAGNOSIS — C50419 Malignant neoplasm of upper-outer quadrant of unspecified female breast: Secondary | ICD-10-CM

## 2012-07-20 DIAGNOSIS — C50412 Malignant neoplasm of upper-outer quadrant of left female breast: Secondary | ICD-10-CM

## 2012-07-20 NOTE — Patient Instructions (Signed)
Come back in 1 week to reassess other drain.  We will pull drain if output <20 ml/day for 3 consecutive days.  If higher than that, call and move appointment.

## 2012-07-20 NOTE — Assessment & Plan Note (Signed)
Patient doing well postoperatively. One drain is removed.  I will see her next week to hopefully remove the second drain.

## 2012-07-20 NOTE — Progress Notes (Signed)
HISTORY: A 77 year old female approximately 2 weeks status post left mastectomy and sentinel lymph node biopsy for cancer. She had had a previous right-sided mastectomy and desired symmetry. She also desired not to receive any radiation.  She is doing well overall. She has come down significantly on her pain medication. She is taking around half a pain pill every day to every other day. Her mobility is fairly good as well.  Her drain #2 is less than 5 mL per day.    EXAM: General:  Alert and oriented Incision:  Healing well.  steristrips in place.    PATHOLOGY: 1.5 cm cancer, negative lymph nodes.    ASSESSMENT AND PLAN:   Cancer of upper-outer quadrant of LEFT female breast Patient doing well postoperatively. One drain is removed.  I will see her next week to hopefully remove the second drain.      Milus Height, MD Surgical Oncology, [facility], Maylon Peppers, MD [name], MD

## 2012-07-26 ENCOUNTER — Telehealth (INDEPENDENT_AMBULATORY_CARE_PROVIDER_SITE_OTHER): Payer: Self-pay | Admitting: *Deleted

## 2012-07-26 NOTE — Telephone Encounter (Signed)
Spoke to [place] MD who states to cancel appt tomorrow due to amount still draining.  Patient updated to call next week to make appt with Bernie to pull drain when it is draining less than 20cc for 3 consecutive days.  Patient states understanding.  Patient then scheduled for follow up appt for 6/2 @ 945a per Barry Dienes MD.  Patient states understanding and agreeable at this time.

## 2012-07-26 NOTE — Telephone Encounter (Signed)
Patient called to state that drainage is still 35cc and they were instructed to call and move the appt per [facility] MD since the drainage is still so high.  Spoke to [place] MD who will think about what she wants to do and call back later.

## 2012-07-27 ENCOUNTER — Encounter (INDEPENDENT_AMBULATORY_CARE_PROVIDER_SITE_OTHER): Payer: Medicare Other | Admitting: General Surgery

## 2012-07-28 ENCOUNTER — Emergency Department (HOSPITAL_COMMUNITY)
Admission: EM | Admit: 2012-07-28 | Discharge: 2012-07-28 | Disposition: A | Discharge status: Home / Self Care | Payer: Medicare Other | Attending: General Surgery | Admitting: General Surgery

## 2012-07-28 DIAGNOSIS — F411 Generalized anxiety disorder: Secondary | ICD-10-CM | POA: Insufficient documentation

## 2012-07-28 DIAGNOSIS — I10 Essential (primary) hypertension: Secondary | ICD-10-CM | POA: Insufficient documentation

## 2012-07-28 DIAGNOSIS — T8140XA Infection following a procedure, unspecified, initial encounter: Secondary | ICD-10-CM | POA: Insufficient documentation

## 2012-07-28 DIAGNOSIS — Z79899 Other long term (current) drug therapy: Secondary | ICD-10-CM | POA: Insufficient documentation

## 2012-07-28 DIAGNOSIS — C50412 Malignant neoplasm of upper-outer quadrant of left female breast: Secondary | ICD-10-CM

## 2012-07-28 DIAGNOSIS — M129 Arthropathy, unspecified: Secondary | ICD-10-CM | POA: Insufficient documentation

## 2012-07-28 DIAGNOSIS — Y836 Removal of other organ (partial) (total) as the cause of abnormal reaction of the patient, or of later complication, without mention of misadventure at the time of the procedure: Secondary | ICD-10-CM | POA: Insufficient documentation

## 2012-07-28 MED ORDER — CEPHALEXIN 500 MG PO CAPS: 500.0000 mg | ORAL_CAPSULE | Freq: Three times a day (TID) | ORAL | Status: DC

## 2012-07-28 NOTE — ED Notes (Signed)
Pt A & O . Post op  f/u with Dr. Excell Seltzer s/p complications of mastectomy.   Incision cleased with NS and dressing applied by MD. JP drained removed.

## 2012-07-28 NOTE — ED Notes (Addendum)
Pt to the ED with c/o left breat drainage. Pt had a mastectomy three and a half weeks ago. Pt now reports drainage, sour odor, and redness. Pt denies fevers or chills.

## 2012-07-28 NOTE — Discharge Summary (Signed)
  History: Patient is approximately 3 weeks following left total mastectomy for breast cancer. She called today stating that there was some drainage and odor coming from under the Steri-Strips on her wound and she was concerned. I asked her to come to the emergency department for evaluation. She has not had any fever or chills.  Exam: BP 166/75  Pulse 91  Temp(Src) 98.5 F (36.9 C) (Oral)  Resp 18  SpO2 100% General: Healthy-appearing in no distress Incision: Left mastectomy wound is Steri-Stripped and there is some drainage from 2 areas under the Steri-Strips. I removed all the Steri-Strips. There are 2 somewhat limited areas of superficial skin necrosis medially and laterally with some overlying eschar and exudate. Minimal erythema around the medial area. Her JP drain has cloudy drainage with some particulate matter as well as drained only 10 cc in the past 14 hours.  Assessment and plan: Minor skin edge necrosis mastectomy wound with minor cellulitis. I cleaned the wound in the emergency department should her husband had a clean the area twice daily with saline. I'm going to give her one week of Keflex. I removed the JP drain. She has an appointment in the office and she is given instructions. All her questions were answered.

## 2012-07-28 NOTE — ED Notes (Signed)
Dr. Excell Seltzer paged from [place]

## 2012-07-30 ENCOUNTER — Telehealth (INDEPENDENT_AMBULATORY_CARE_PROVIDER_SITE_OTHER): Payer: Self-pay | Admitting: *Deleted

## 2012-07-30 NOTE — Telephone Encounter (Signed)
Patient's husband called to state that incision looks red and inflamed. Husband states some mild bleeding at the incision site.  Looked through patients notes and found that patient was seen in the [facility] on Saturday and was started on Keflex for 7 days. Patient at this time has only taken 1.5 days of the antibiotic.  Suggested to husband for patient to stay on antibiotic and continue to watch the area as she completes the antibiotic.  Husband encouraged to call back following finishing the antibiotic if signs and symptoms continue at incision site.  Husband states understanding and agreeable at this time.

## 2012-08-07 ENCOUNTER — Telehealth (INDEPENDENT_AMBULATORY_CARE_PROVIDER_SITE_OTHER): Payer: Self-pay

## 2012-08-07 NOTE — Telephone Encounter (Signed)
Patient is having small clear drainage masty site wants to have her ABT  Keflex 500 mg one po tid refilled @ [facility] [phone] PT denies fever , serous drainage. Please advise

## 2012-08-08 ENCOUNTER — Ambulatory Visit (INDEPENDENT_AMBULATORY_CARE_PROVIDER_SITE_OTHER): Payer: Medicare Other | Admitting: Surgery

## 2012-08-08 ENCOUNTER — Encounter (INDEPENDENT_AMBULATORY_CARE_PROVIDER_SITE_OTHER): Payer: Self-pay | Admitting: Surgery

## 2012-08-08 VITALS — BP 142/66 | HR 70 | Temp 97.9°F | Ht 62.5 in | Wt 140.8 lb

## 2012-08-08 DIAGNOSIS — Z9889 Other specified postprocedural states: Secondary | ICD-10-CM

## 2012-08-08 NOTE — Patient Instructions (Signed)
Continue showers and dressing chagnes twice daily. See Dr Barry Dienes Monday as scheduled

## 2012-08-08 NOTE — Progress Notes (Signed)
NAME: Victoria Moran       DOB: 26-Oct-1935           DATE: 08/08/2012       [id number]  CC:  Chief Complaint  Patient presents with  . Follow-up    reck Lt br masty    HPI: this patient is about 4 weeks status post left mastectomy. Her drains are out but she was having some drainage from the lateral aspect of her incision. She was seen in the emergency room several days ago and placed on antibiotics. She ahd a little more drainage from the wound and was concerned so came to the urgent office. No fevers no pain, otherwise doing well  EXAM: Vital signs: BP 142/66  Pulse 70  Temp(Src) 97.9 F (36.6 C) (Temporal)  Ht 5' 2.5" (1.588 m)  Wt 140 lb 12.8 oz (63.866 kg)  BMI 25.33 kg/m2  SpO2 97%  General: Patient alert, oriented, NAD  Mastectomy wound medially has a narrow strip of skin necrosis and the latera corner has a 2cm round area of skin necrosis with about half covered with ecchar and the rest with soft debris, starting to show some granulations, No cellulitis and no seroma IMP: Slow healing due to some ischemic skin, without active infection  PLAN: Continue local wound care and see Dr Barry Dienes as scheduled Monday  Soloman Mckeithan J 08/08/2012

## 2012-08-13 ENCOUNTER — Encounter (INDEPENDENT_AMBULATORY_CARE_PROVIDER_SITE_OTHER): Payer: Self-pay | Admitting: General Surgery

## 2012-08-13 ENCOUNTER — Ambulatory Visit (INDEPENDENT_AMBULATORY_CARE_PROVIDER_SITE_OTHER): Payer: Medicare Other | Admitting: General Surgery

## 2012-08-13 VITALS — BP 130/70 | HR 71 | Temp 98.6°F | Resp 18 | Ht 62.0 in | Wt 141.0 lb

## 2012-08-13 DIAGNOSIS — C50412 Malignant neoplasm of upper-outer quadrant of left female breast: Secondary | ICD-10-CM

## 2012-08-13 DIAGNOSIS — W57XXXA Bitten or stung by nonvenomous insect and other nonvenomous arthropods, initial encounter: Secondary | ICD-10-CM

## 2012-08-13 DIAGNOSIS — S30860A Insect bite (nonvenomous) of lower back and pelvis, initial encounter: Secondary | ICD-10-CM

## 2012-08-13 DIAGNOSIS — C50419 Malignant neoplasm of upper-outer quadrant of unspecified female breast: Secondary | ICD-10-CM

## 2012-08-13 MED ORDER — DOXYCYCLINE HYCLATE 100 MG PO TABS: 100.0000 mg | ORAL_TABLET | Freq: Two times a day (BID) | ORAL | Status: DC

## 2012-08-13 NOTE — Assessment & Plan Note (Signed)
Patient with cellulitis at site of tick bite on the back. Patient and her husband deny that this was enlarged and full of blood, but it was on her back for several days. I will give her some doxycycline. She's advised followup with her primary care doctor this is unimproved.

## 2012-08-13 NOTE — Patient Instructions (Signed)
Take doxycyline. Follow up with primary care doctor if the tick bite area does not get better in 1-2 weeks.  Follow up with me in 2 weeks.

## 2012-08-13 NOTE — Assessment & Plan Note (Signed)
Patient had a small amount of skin necrosis. We're going to put some antibiotic ointment on this to soften up the eschar. I will follow her up in 2 weeks.

## 2012-08-13 NOTE — Progress Notes (Signed)
HISTORY: The patient went to the ER with concerns of infection several weeks ago. Dr. Excell Seltzer removed the drain and put her on some antibiotics. She then saw Dr. Margot Chimes and had some eschar debrided.  She is doing much better at this point. She denies any pain. She is not taking any analgesics. She does complain of a tick bite. She had a spot that is sharp back and her husband thought it was a scab. They placed a Band-Aid on it and kept itching. He looked at it again with his glasses on and noted that it was a tick. He placed turpentine on it, and it came out intact.    EXAM: General:  Alert and oriented.   Incision:  Healing. Eschar scabbed over and starting to pull up on edges.    PATHOLOGY: IDC, benign LN.     ASSESSMENT AND PLAN:   Cancer of upper-outer quadrant of LEFT female breast, Stage I Patient had a small amount of skin necrosis. We're going to put some antibiotic ointment on this to soften up the eschar. I will follow her up in 2 weeks.  Tick bite of back Patient with cellulitis at site of tick bite on the back. Patient and her husband deny that this was enlarged and full of blood, but it was on her back for several days. I will give her some doxycycline. She's advised followup with her primary care doctor this is unimproved.      Milus Height, MD Surgical Oncology, [facility], Maylon Peppers, MD [name], MD

## 2012-08-21 ENCOUNTER — Ambulatory Visit (INDEPENDENT_AMBULATORY_CARE_PROVIDER_SITE_OTHER): Payer: Medicare Other | Admitting: General Surgery

## 2012-08-21 ENCOUNTER — Encounter (INDEPENDENT_AMBULATORY_CARE_PROVIDER_SITE_OTHER): Payer: Self-pay | Admitting: General Surgery

## 2012-08-21 VITALS — BP 126/64 | HR 72 | Temp 97.4°F | Resp 14 | Ht 62.5 in | Wt 141.2 lb

## 2012-08-21 DIAGNOSIS — C50412 Malignant neoplasm of upper-outer quadrant of left female breast: Secondary | ICD-10-CM

## 2012-08-21 DIAGNOSIS — C50419 Malignant neoplasm of upper-outer quadrant of unspecified female breast: Secondary | ICD-10-CM

## 2012-08-21 DIAGNOSIS — Z5189 Encounter for other specified aftercare: Secondary | ICD-10-CM

## 2012-08-21 DIAGNOSIS — S30860D Insect bite (nonvenomous) of lower back and pelvis, subsequent encounter: Secondary | ICD-10-CM

## 2012-08-21 NOTE — Patient Instructions (Signed)
Follow up in 1 month if wound has not healed completely over.  Follow up in 6 months also.

## 2012-08-21 NOTE — Assessment & Plan Note (Signed)
Follow up with Dr. Hinton Rao in [place] in 3 months  Follow up with me in 4 weeks if wound not closed.  Follow up in 6 months as well.

## 2012-08-21 NOTE — Assessment & Plan Note (Signed)
Still firm, but no erythema or fluctuance.  Follow up with PCP.

## 2012-08-21 NOTE — Progress Notes (Signed)
HISTORY: Pt is s/p left mastectomy complicated by necrosis of wound edges.  She had prior right mastectomy.  She has been putting antibiotic ointment on wound.  Her husband has been trimming the scab.  She is not taking anything for pain.    At last visit, she had tick bite with some erythema.  I placed her on doxycycline.  It is not painful, but is still itching a bit.  She is not having fevers/ chills.    EXAM: General:  Alert and oriented.   Incision:  Healing. Minimal scabbing.  Still some fibrinous exudate.    ASSESSMENT AND PLAN:   Cancer of upper-outer quadrant of LEFT female breast, Stage I Patient had a small amount of skin necrosis. We're going to put some antibiotic ointment on this to soften up the eschar. I will follow her up in 2 weeks.  Tick bite of back Patient with cellulitis at site of tick bite on the back. Patient and her husband deny that this was enlarged and full of blood, but it was on her back for several days. I will give her some doxycycline. She's advised followup with her primary care doctor this is unimproved.      Milus Height, MD Surgical Oncology, [facility], Maylon Peppers, MD [name], MD

## 2012-08-24 ENCOUNTER — Encounter (INDEPENDENT_AMBULATORY_CARE_PROVIDER_SITE_OTHER): Payer: Medicare Other | Admitting: General Surgery

## 2012-10-01 ENCOUNTER — Encounter (INDEPENDENT_AMBULATORY_CARE_PROVIDER_SITE_OTHER): Payer: Medicare Other | Admitting: General Surgery

## 2013-01-21 ENCOUNTER — Encounter (INDEPENDENT_AMBULATORY_CARE_PROVIDER_SITE_OTHER): Payer: Self-pay | Admitting: General Surgery

## 2013-01-21 ENCOUNTER — Ambulatory Visit (INDEPENDENT_AMBULATORY_CARE_PROVIDER_SITE_OTHER): Payer: Medicare Other | Admitting: General Surgery

## 2013-01-21 ENCOUNTER — Encounter (INDEPENDENT_AMBULATORY_CARE_PROVIDER_SITE_OTHER): Payer: Self-pay

## 2013-01-21 VITALS — BP 130/78 | HR 82 | Temp 97.2°F | Resp 16 | Ht 62.0 in | Wt 142.2 lb

## 2013-01-21 DIAGNOSIS — C50419 Malignant neoplasm of upper-outer quadrant of unspecified female breast: Secondary | ICD-10-CM

## 2013-01-21 DIAGNOSIS — C50412 Malignant neoplasm of upper-outer quadrant of left female breast: Secondary | ICD-10-CM

## 2013-01-21 NOTE — Patient Instructions (Signed)
Follow up in 5-6 months.  Examine chest wall.

## 2013-01-22 NOTE — Progress Notes (Signed)
HISTORY: Pt having no problems now.  She has not felt any masses or LAD.  She denies chest wall pain.  She is tolerating arimedex with no significant side effects.     PERTINENT REVIEW OF SYSTEMS: Otherwise negative x 11.    Filed Vitals:   01/21/13 1552  BP: 130/78  Pulse: 82  Temp: 97.2 F (36.2 C)  Resp: 16   Filed Weights   01/21/13 1552  Weight: 142 lb 3.2 oz (64.501 kg)     EXAM: Head: Normocephalic and atraumatic.  Eyes:  Conjunctivae are normal. Pupils are equal, round, and reactive to light. No scleral icterus.  Neck:  Normal range of motion. Neck supple. No tracheal deviation present. No thyromegaly present.  Chest wall:  No tenderness.  No palpable masses.  No axillary LAD.   Resp: No respiratory distress, normal effort. Abd:  Abdomen is soft, non distended and non tender. No masses are palpable.  There is no rebound and no guarding.  Neurological: Alert and oriented to person, place, and time. Coordination normal.  Skin: Skin is warm and dry. No rash noted. No diaphoretic. No erythema. No pallor.  Psychiatric: Normal mood and affect. Normal behavior. Judgment and thought content normal.      ASSESSMENT AND PLAN:   Cancer of upper-outer quadrant of LEFT female breast No clinical evidence of disease.    Follow up in 5-6 months.    Continue Arimedex.        Milus Height, MD Surgical Oncology, [facility], Maylon Peppers, MD [name], MD

## 2013-01-22 NOTE — Assessment & Plan Note (Addendum)
No clinical evidence of disease.    Follow up in 5-6 months.    Continue Arimedex.

## 2013-02-18 ENCOUNTER — Ambulatory Visit (INDEPENDENT_AMBULATORY_CARE_PROVIDER_SITE_OTHER): Payer: Medicare Other | Admitting: General Surgery

## 2013-07-30 ENCOUNTER — Ambulatory Visit (INDEPENDENT_AMBULATORY_CARE_PROVIDER_SITE_OTHER): Payer: Medicare Other | Admitting: General Surgery

## 2013-07-30 ENCOUNTER — Encounter (INDEPENDENT_AMBULATORY_CARE_PROVIDER_SITE_OTHER): Payer: Self-pay | Admitting: General Surgery

## 2013-07-30 VITALS — BP 136/74 | HR 58 | Temp 97.8°F | Ht 62.0 in | Wt 136.0 lb

## 2013-07-30 DIAGNOSIS — C50419 Malignant neoplasm of upper-outer quadrant of unspecified female breast: Secondary | ICD-10-CM

## 2013-07-30 NOTE — Patient Instructions (Signed)
Follow up with me in 6 months.  Follow up with oncology in around 3 months.

## 2013-08-05 NOTE — Progress Notes (Signed)
HISTORY: Pt is s/p L mastectomy with negative SLN bx 06/2012.  She had previous mastectomy for right breast cancer.  She is doing well.  She denies chest wall pain.  She is not having any issues with chest wall masses.  Her overall health is unchanged.  Her tick bite issues resolved.    PERTINENT REVIEW OF SYSTEMS: Otherwise negative x 11.    Filed Vitals:   07/30/13 1628  BP: 136/74  Pulse: 58  Temp: 97.8 F (36.6 C)   Filed Weights   07/30/13 1628  Weight: 136 lb (61.689 kg)     EXAM: Head: Normocephalic and atraumatic.  Eyes:  Conjunctivae are normal. Pupils are equal, round, and reactive to light. No scleral icterus.  Neck:  Normal range of motion. Neck supple. No tracheal deviation present. No thyromegaly present.  Chest wall:  No tenderness.  No palpable masses.  No axillary LAD.  No infra or supraclavicular lymphadenopathy.   Resp: No respiratory distress, normal effort. Abd:  Abdomen is soft, non distended and non tender. No masses are palpable.  There is no rebound and no guarding.  Neurological: Alert and oriented to person, place, and time. Coordination normal.  Skin: Skin is warm and dry. No rash noted. No diaphoretic. No erythema. No pallor.  Psychiatric: Normal mood and affect. Normal behavior. Judgment and thought content normal.      ASSESSMENT AND PLAN:   Cancer of upper-outer quadrant of LEFT female breast No clinical evidence of disease.   Pt to continue on Arimedex.  Pt is right now around 1 year out.  She will not need imaging since she is s/p bilateral mastectomy.  This was discussed with the patient.       Milus Height, MD Surgical Oncology, [facility], Maylon Peppers, MD [name], MD

## 2013-08-05 NOTE — Assessment & Plan Note (Addendum)
No clinical evidence of disease.   Pt to continue on Arimedex.  Pt is right now around 1 year out.  She will not need imaging since she is s/p bilateral mastectomy.  This was discussed with the patient.   She will need oncology follow up.

## 2013-09-12 ENCOUNTER — Other Ambulatory Visit: Payer: Self-pay | Admitting: Orthopedic Surgery

## 2013-09-13 ENCOUNTER — Encounter (HOSPITAL_COMMUNITY): Payer: Self-pay | Admitting: Pharmacy Technician

## 2013-09-19 ENCOUNTER — Other Ambulatory Visit (HOSPITAL_COMMUNITY): Payer: Self-pay | Admitting: *Deleted

## 2013-09-19 NOTE — Patient Instructions (Addendum)
Victoria Moran  09/19/2013                           YOUR PROCEDURE IS SCHEDULED ON: 09/27/13 AT 3:00 PM               [place] ENTRANCE AND                            FOLLOW  SIGNS TO SHORT STAY CENTER                 ARRIVE AT SHORT STAY AT: 12:30  PM               CALL THIS NUMBER IF ANY PROBLEMS THE DAY OF SURGERY :               [phone]                                REMEMBER:   Do not eat food  AFTER MIDNIGHT   May have clear liquids UNTIL 6 HOURS BEFORE SURGERY (9:00 AM)               Take these medicines the morning of surgery with               A SIPS OF WATER :   AMLODIPINE / ARIMIDEX / METOPROLOL / PREDNISION / MAY TAKE HYDROCODONE IF NEEDED      Do not wear jewelry, make-up   Do not wear lotions, powders, or perfumes.   Do not shave legs or underarms 12 hrs. before surgery (men may shave face)  Do not bring valuables to the hospital.  Contacts, dentures or bridgework may not be worn into surgery.  Leave suitcase in the car. After surgery it may be brought to your room.  For patients admitted to the hospital more than one night, checkout time is            11:00 AM                                                     ________________________________________________________________________                 CLEAR LIQUID DIET   Foods Allowed                                                                     Foods Excluded  Coffee and tea, regular and decaf                             liquids that you cannot  Plain Jell-O in any flavor  see through such as: Fruit ices (not with fruit pulp)                                     milk, soups, orange juice  Iced Popsicles                                    All solid food Carbonated beverages, regular and diet                                    Cranberry, grape and apple juices Sports drinks like Gatorade Lightly seasoned clear broth or  consume(fat free) Sugar, honey syrup  _____________________________________________________________________                [facility]  Before surgery, you can play an important role.  Because skin is not sterile, your skin needs to be as free of germs as possible.  You can reduce the number of germs on your skin by washing with CHG (chlorahexidine gluconate) soap before surgery.  CHG is an antiseptic cleaner which kills germs and bonds with the skin to continue killing germs even after washing. Please DO NOT use if you have an allergy to CHG or antibacterial soaps.  If your skin becomes reddened/irritated stop using the CHG and inform your nurse when you arrive at Short Stay. Do not shave (including legs and underarms) for at least 48 hours prior to the first CHG shower.  You may shave your face. Please follow these instructions carefully:   1.  Shower with CHG Soap the night before surgery and the  morning of Surgery.   2.  If you choose to wash your hair, wash your hair first as usual with your  normal  Shampoo.   3.  After you shampoo, rinse your hair and body thoroughly to remove the  shampoo.                                         4.  Use CHG as you would any other liquid soap.  You can apply chg directly  to the skin and wash . Gently wash with scrungie or clean wascloth    5.  Apply the CHG Soap to your body ONLY FROM THE NECK DOWN.   Do not use on open                           Wound or open sores. Avoid contact with eyes, ears mouth and genitals (private parts).                        Genitals (private parts) with your normal soap.              6.  Wash thoroughly, paying special attention to the area where your surgery  will be performed.   7.  Thoroughly rinse your body with warm water from the neck down.   8.  DO NOT shower/wash with your normal soap after using and rinsing off  the CHG Soap .  9.  Pat yourself dry with a clean  towel.             10.  Wear clean pajamas.             11.  Place clean sheets on your bed the night of your first shower and do not  sleep with pets.  Day of Surgery : Do not apply any lotions/deodorants the morning of surgery.  Please wear clean clothes to the hospital/surgery center.  FAILURE TO FOLLOW THESE INSTRUCTIONS MAY RESULT IN THE CANCELLATION OF YOUR SURGERY    PATIENT SIGNATURE_________________________________  ______________________________________________________________________     Victoria Moran  An incentive spirometer is a tool that can help keep your lungs clear and active. This tool measures how well you are filling your lungs with each breath. Taking long deep breaths may help reverse or decrease the chance of developing breathing (pulmonary) problems (especially infection) following:  A long period of time when you are unable to move or be active. BEFORE THE PROCEDURE   If the spirometer includes an indicator to show your best effort, your nurse or respiratory therapist will set it to a desired goal.  If possible, sit up straight or lean slightly forward. Try not to slouch.  Hold the incentive spirometer in an upright position. INSTRUCTIONS FOR USE  1. Sit on the edge of your bed if possible, or sit up as far as you can in bed or on a chair. 2. Hold the incentive spirometer in an upright position. 3. Breathe out normally. 4. Place the mouthpiece in your mouth and seal your lips tightly around it. 5. Breathe in slowly and as deeply as possible, raising the piston or the ball toward the top of the column. 6. Hold your breath for 3-5 seconds or for as long as possible. Allow the piston or ball to fall to the bottom of the column. 7. Remove the mouthpiece from your mouth and breathe out normally. 8. Rest for a few seconds and repeat Steps 1 through 7 at least 10 times every 1-2 hours when you are awake. Take your time and take a few normal breaths  between deep breaths. 9. The spirometer may include an indicator to show your best effort. Use the indicator as a goal to work toward during each repetition. 10. After each set of 10 deep breaths, practice coughing to be sure your lungs are clear. If you have an incision (the cut made at the time of surgery), support your incision when coughing by placing a pillow or rolled up towels firmly against it. Once you are able to get out of bed, walk around indoors and cough well. You may stop using the incentive spirometer when instructed by your caregiver.  RISKS AND COMPLICATIONS  Take your time so you do not get dizzy or light-headed.  If you are in pain, you may need to take or ask for pain medication before doing incentive spirometry. It is harder to take a deep breath if you are having pain. AFTER USE  Rest and breathe slowly and easily.  It can be helpful to keep track of a log of your progress. Your caregiver can provide you with a simple table to help with this. If you are using the spirometer at home, follow these instructions: [place] IF:   You are having difficultly using the spirometer.  You have trouble using the spirometer as often as instructed.  Your pain medication is not giving enough relief while  using the spirometer.  You develop fever of 100.5 F (38.1 C) or higher. SEEK IMMEDIATE MEDICAL CARE IF:   You cough up bloody sputum that had not been present before.  You develop fever of 102 F (38.9 C) or greater.  You develop worsening pain at or near the incision site. MAKE SURE YOU:   Understand these instructions.  Will watch your condition.  Will get help right away if you are not doing well or get worse. Document Released: 07/11/2006 Document Revised: 05/23/2011 Document Reviewed: 09/11/2006 [facility] Patient Information 2014 [place], [place].   ________________________________________________________________________

## 2013-09-20 ENCOUNTER — Ambulatory Visit (HOSPITAL_COMMUNITY)
Admission: RE | Admit: 2013-09-20 | Discharge: 2013-09-20 | Disposition: A | Discharge status: Home / Self Care | Payer: Medicare Other | Source: Ambulatory Visit | Attending: Anesthesiology | Admitting: Anesthesiology

## 2013-09-20 ENCOUNTER — Encounter (HOSPITAL_COMMUNITY)
Admission: RE | Admit: 2013-09-20 | Discharge: 2013-09-20 | Disposition: A | Discharge status: Home / Self Care | Payer: Medicare Other | Source: Ambulatory Visit | Attending: Orthopedic Surgery | Admitting: Orthopedic Surgery

## 2013-09-20 ENCOUNTER — Encounter (HOSPITAL_COMMUNITY): Payer: Self-pay

## 2013-09-20 DIAGNOSIS — Z01812 Encounter for preprocedural laboratory examination: Secondary | ICD-10-CM | POA: Insufficient documentation

## 2013-09-20 DIAGNOSIS — Z01818 Encounter for other preprocedural examination: Secondary | ICD-10-CM | POA: Insufficient documentation

## 2013-09-20 DIAGNOSIS — Z01810 Encounter for preprocedural cardiovascular examination: Secondary | ICD-10-CM | POA: Insufficient documentation

## 2013-09-20 HISTORY — DX: Other specified disorders of bone density and structure, unspecified site: M85.80

## 2013-09-20 LAB — CBC
HEMATOCRIT: 35.4 % — AB (ref 36.0–46.0)
HEMOGLOBIN: 11.7 g/dL — AB (ref 12.0–15.0)
MCH: 33 pg (ref 26.0–34.0)
MCHC: 33.1 g/dL (ref 30.0–36.0)
MCV: 99.7 fL (ref 78.0–100.0)
Platelets: 283 10*3/uL (ref 150–400)
RBC: 3.55 MIL/uL — ABNORMAL LOW (ref 3.87–5.11)
RDW: 13.1 % (ref 11.5–15.5)
WBC: 9 10*3/uL (ref 4.0–10.5)

## 2013-09-20 LAB — BASIC METABOLIC PANEL
ANION GAP: 9 (ref 5–15)
BUN: 20 mg/dL (ref 6–23)
CHLORIDE: 104 meq/L (ref 96–112)
CO2: 28 meq/L (ref 19–32)
CREATININE: 1.18 mg/dL — AB (ref 0.50–1.10)
Calcium: 9.4 mg/dL (ref 8.4–10.5)
GFR calc Af Amer: 51 mL/min — ABNORMAL LOW (ref >90)
GFR calc non Af Amer: 44 mL/min — ABNORMAL LOW (ref >90)
Glucose, Bld: 77 mg/dL (ref 70–99)
Potassium: 4.4 mEq/L (ref 3.7–5.3)
Sodium: 141 mEq/L (ref 137–147)

## 2013-09-26 NOTE — H&P (Signed)
CC- Victoria Moran is a 78 y.o. female who presents with right hip pain  Hip Pain: Patient complains of right hip pain. Onset of the symptoms was several months ago. Inciting event: none. Current symptoms include lateral hip pain. Pain is worse with activity and lying on right side. No complaints of groin pain. Injection and exercise did not provide any long term relief. MRI showed gluteal tendon tear, She presents now for bursectomy and gluteal tendon repair.  Past Medical History  Diagnosis Date  . Arthritis   . Hypertension   . Cancer 2009  . Anxiety   . Gluteal pain     glutearl tear rt hip  . Difficulty sleeping   . Osteopenia     Past Surgical History  Procedure Laterality Date  . Rotator cuff repair  ?2005    RT SHOULDER  . Knee surgery      ARTHROSCOPY - LEFT  . Elbow surgery  2003    lt  . Foot surgery  2009    fx toe lt  . Decompressive lumbar laminectomy level 2  03/08/2012    Procedure: DECOMPRESSIVE LUMBAR LAMINECTOMY LEVEL 2;  Surgeon: Melina Schools, MD;  Location: [facility];  Service: Orthopedics;  Laterality: N/A;  Decompression L3-L5   . Mastectomy  2009    Right-snbx x2  . Eye surgery      both cataracts  . Mastectomy w/ sentinel node biopsy Left 07/04/2012    Procedure: MASTECTOMY WITH SENTINEL LYMPH NODE BIOPSY;  Surgeon: Stark Klein, MD;  Location: [facility];  Service: General;  Laterality: Left;  . Breast surgery Left 2009/20014    BIL MASTECTOMY    Prior to Admission medications   Medication Sig Start Date End Date Taking? Authorizing Provider  amLODipine (NORVASC) 5 MG tablet Take 5 mg by mouth every morning.     Historical Provider, MD  anastrozole (ARIMIDEX) 1 MG tablet Take 1 mg by mouth daily.    Historical Provider, MD  calcium carbonate (OS-CAL) 600 MG TABS Take 600 mg by mouth 2 (two) times daily with a meal.    Historical Provider, MD  cholecalciferol (VITAMIN D) 1000 UNITS tablet Take 1,000 Units by mouth daily.     Historical Provider, MD  clonazePAM (KLONOPIN) 1 MG tablet Take 0.5-1 mg by mouth at bedtime as needed (Sleep).    Historical Provider, MD  clorazepate (TRANXENE) 7.5 MG tablet Take 7.5 mg by mouth daily as needed for sleep.     Historical Provider, MD  cyclobenzaprine (FLEXERIL) 10 MG tablet Take 10 mg by mouth at bedtime as needed for muscle spasms. 03/09/12   Melina Schools, MD  docusate sodium (COLACE) 100 MG capsule Take 100 mg by mouth daily as needed for constipation. 03/09/12   Melina Schools, MD  GLUCOSAMINE PO Take 1 Can by mouth daily.    Historical Provider, MD  HYDROcodone-acetaminophen (NORCO) 10-325 MG per tablet Take 1 tablet by mouth every 4 (four) hours as needed (Pain).    Historical Provider, MD  metoprolol (LOPRESSOR) 50 MG tablet Take 75 mg by mouth 2 (two) times daily. Take 1 1/2 of 50 mg=75mg  BID    Historical Provider, MD  Multiple Vitamins-Minerals (MACULAR VITAMIN BENEFIT) TABS Take 2 tablets by mouth 2 (two) times daily.    Historical Provider, MD  nabumetone (RELAFEN) 750 MG tablet Take 750 mg by mouth 2 (two) times daily.    Historical Provider, MD  predniSONE (DELTASONE) 5 MG tablet Take 5 mg by mouth  daily.     Historical Provider, MD    Physical Examination: General appearance - alert, well appearing, and in no distress Mental status - alert, oriented to person, place, and time Chest - clear to auscultation, no wheezes, rales or rhonchi, symmetric air entry Heart - normal rate, regular rhythm, normal S1, S2, no murmurs, rubs, clicks or gallops Abdomen - soft, nontender, nondistended, no masses or organomegaly Neurological - alert, oriented, normal speech, no focal findings or movement disorder noted  A right hip exam was performed. SKIN: intact SWELLING: none WARMTH: no warmth TENDERNESS: maximal at greater trochanter ROM: normal  ASSESSMENT: Right hip pain with intractable bursitis and gluteal tendon tear- Plan bursectomy and tendon repair. Discussed in  detail with patient who elects to proceed  Plan  Dione Plover. Victoria Arp, MD    09/26/2013, 10:46 PM

## 2013-09-27 ENCOUNTER — Encounter (HOSPITAL_COMMUNITY): Payer: Medicare Other | Admitting: Anesthesiology

## 2013-09-27 ENCOUNTER — Ambulatory Visit (HOSPITAL_COMMUNITY): Payer: Medicare Other | Admitting: Anesthesiology

## 2013-09-27 ENCOUNTER — Encounter (HOSPITAL_COMMUNITY): Payer: Self-pay | Admitting: *Deleted

## 2013-09-27 ENCOUNTER — Observation Stay (HOSPITAL_COMMUNITY)
Admission: RE | Admit: 2013-09-27 | Discharge: 2013-09-28 | Disposition: A | Discharge status: Home / Self Care | Payer: Medicare Other | Source: Ambulatory Visit | Attending: Orthopedic Surgery | Admitting: Orthopedic Surgery

## 2013-09-27 ENCOUNTER — Encounter (HOSPITAL_COMMUNITY)
Admission: RE | Disposition: A | Discharge status: Home / Self Care | Payer: Self-pay | Source: Ambulatory Visit | Attending: Orthopedic Surgery

## 2013-09-27 DIAGNOSIS — I10 Essential (primary) hypertension: Secondary | ICD-10-CM | POA: Insufficient documentation

## 2013-09-27 DIAGNOSIS — M899 Disorder of bone, unspecified: Secondary | ICD-10-CM | POA: Insufficient documentation

## 2013-09-27 DIAGNOSIS — Z79899 Other long term (current) drug therapy: Secondary | ICD-10-CM | POA: Insufficient documentation

## 2013-09-27 DIAGNOSIS — Z9010 Acquired absence of unspecified breast and nipple: Secondary | ICD-10-CM | POA: Insufficient documentation

## 2013-09-27 DIAGNOSIS — S76011D Strain of muscle, fascia and tendon of right hip, subsequent encounter: Secondary | ICD-10-CM

## 2013-09-27 DIAGNOSIS — S76011A Strain of muscle, fascia and tendon of right hip, initial encounter: Secondary | ICD-10-CM

## 2013-09-27 DIAGNOSIS — S76019A Strain of muscle, fascia and tendon of unspecified hip, initial encounter: Secondary | ICD-10-CM

## 2013-09-27 DIAGNOSIS — M949 Disorder of cartilage, unspecified: Secondary | ICD-10-CM

## 2013-09-27 DIAGNOSIS — F411 Generalized anxiety disorder: Secondary | ICD-10-CM | POA: Insufficient documentation

## 2013-09-27 DIAGNOSIS — M6688 Spontaneous rupture of other tendons, other: Secondary | ICD-10-CM | POA: Insufficient documentation

## 2013-09-27 DIAGNOSIS — M76899 Other specified enthesopathies of unspecified lower limb, excluding foot: Principal | ICD-10-CM | POA: Insufficient documentation

## 2013-09-27 HISTORY — PX: OPEN SURGICAL REPAIR OF GLUTEAL TENDON: SHX5995

## 2013-09-27 SURGERY — REPAIR, TENDON, GLUTEUS MEDIUS, OPEN
Anesthesia: Spinal | Site: Hip | Laterality: Right

## 2013-09-27 MED ORDER — POLYETHYLENE GLYCOL 3350 17 G PO PACK
17.0000 g | PACK | Freq: Every day | ORAL | Status: DC | PRN
  Filled 2013-09-27: qty 1

## 2013-09-27 MED ORDER — METHOCARBAMOL 1000 MG/10ML IJ SOLN
500.0000 mg | Freq: Four times a day (QID) | INTRAVENOUS | Status: DC | PRN
  Administered 2013-09-27 (×2): 500 mg via INTRAVENOUS
  Filled 2013-09-27 (×2): qty 5

## 2013-09-27 MED ORDER — PROPOFOL 10 MG/ML IV BOLUS
INTRAVENOUS | Status: AC
  Filled 2013-09-27: qty 20

## 2013-09-27 MED ORDER — CYCLOBENZAPRINE HCL 10 MG PO TABS: 10.0000 mg | ORAL_TABLET | Freq: Every evening | ORAL | Status: DC | PRN

## 2013-09-27 MED ORDER — PREDNISONE 5 MG PO TABS
5.0000 mg | ORAL_TABLET | Freq: Every day | ORAL | Status: DC
  Filled 2013-09-27 (×2): qty 1

## 2013-09-27 MED ORDER — FLEET ENEMA 7-19 GM/118ML RE ENEM: 1.0000 | ENEMA | Freq: Once | RECTAL | Status: AC | PRN

## 2013-09-27 MED ORDER — SODIUM CHLORIDE 0.9 % IV SOLN
INTRAVENOUS | Status: DC
  Administered 2013-09-27: 23:00:00 via INTRAVENOUS

## 2013-09-27 MED ORDER — ONDANSETRON HCL 4 MG PO TABS: 4.0000 mg | ORAL_TABLET | Freq: Four times a day (QID) | ORAL | Status: DC | PRN

## 2013-09-27 MED ORDER — BUPIVACAINE HCL (PF) 0.25 % IJ SOLN
INTRAMUSCULAR | Status: AC
  Filled 2013-09-27: qty 30

## 2013-09-27 MED ORDER — CEFAZOLIN SODIUM-DEXTROSE 2-3 GM-% IV SOLR
2.0000 g | Freq: Four times a day (QID) | INTRAVENOUS | Status: AC
  Administered 2013-09-27 – 2013-09-28 (×2): 2 g via INTRAVENOUS
  Filled 2013-09-27 (×2): qty 50

## 2013-09-27 MED ORDER — BUPIVACAINE LIPOSOME 1.3 % IJ SUSP
INTRAMUSCULAR | Status: DC | PRN
  Administered 2013-09-27: 20 mL

## 2013-09-27 MED ORDER — PHENYLEPHRINE HCL 10 MG/ML IJ SOLN
INTRAMUSCULAR | Status: AC
  Filled 2013-09-27: qty 1

## 2013-09-27 MED ORDER — FENTANYL CITRATE 0.05 MG/ML IJ SOLN
INTRAMUSCULAR | Status: AC
  Filled 2013-09-27: qty 2

## 2013-09-27 MED ORDER — MIDAZOLAM HCL 5 MG/5ML IJ SOLN
INTRAMUSCULAR | Status: DC | PRN
  Administered 2013-09-27: 2 mg via INTRAVENOUS

## 2013-09-27 MED ORDER — CEFAZOLIN SODIUM-DEXTROSE 2-3 GM-% IV SOLR
INTRAVENOUS | Status: AC
  Filled 2013-09-27: qty 50

## 2013-09-27 MED ORDER — ARTIFICIAL TEARS OP OINT
TOPICAL_OINTMENT | OPHTHALMIC | Status: DC | PRN
  Administered 2013-09-27 (×2): via OPHTHALMIC
  Filled 2013-09-27: qty 3.5

## 2013-09-27 MED ORDER — PHENYLEPHRINE 40 MCG/ML (10ML) SYRINGE FOR IV PUSH (FOR BLOOD PRESSURE SUPPORT)
PREFILLED_SYRINGE | INTRAVENOUS | Status: AC
  Filled 2013-09-27: qty 10

## 2013-09-27 MED ORDER — MORPHINE SULFATE 2 MG/ML IJ SOLN: 1.0000 mg | INTRAMUSCULAR | Status: DC | PRN

## 2013-09-27 MED ORDER — PROMETHAZINE HCL 25 MG/ML IJ SOLN: 6.2500 mg | INTRAMUSCULAR | Status: DC | PRN

## 2013-09-27 MED ORDER — SODIUM CHLORIDE 0.9 % IV SOLN: INTRAVENOUS | Status: DC

## 2013-09-27 MED ORDER — METOCLOPRAMIDE HCL 5 MG/ML IJ SOLN: 5.0000 mg | Freq: Three times a day (TID) | INTRAMUSCULAR | Status: DC | PRN

## 2013-09-27 MED ORDER — LACTATED RINGERS IV SOLN
INTRAVENOUS | Status: DC
Start: 2013-09-27 — End: 2013-09-27
  Administered 2013-09-27: 1000 mL via INTRAVENOUS

## 2013-09-27 MED ORDER — METOCLOPRAMIDE HCL 10 MG PO TABS: 5.0000 mg | ORAL_TABLET | Freq: Three times a day (TID) | ORAL | Status: DC | PRN

## 2013-09-27 MED ORDER — SODIUM CHLORIDE 0.9 % IJ SOLN
INTRAMUSCULAR | Status: AC
Start: 2013-09-27 — End: 2013-09-27
  Filled 2013-09-27: qty 50

## 2013-09-27 MED ORDER — BISACODYL 10 MG RE SUPP: 10.0000 mg | Freq: Every day | RECTAL | Status: DC | PRN

## 2013-09-27 MED ORDER — OXYCODONE HCL 5 MG/5ML PO SOLN
5.0000 mg | Freq: Once | ORAL | Status: DC | PRN
  Filled 2013-09-27: qty 5

## 2013-09-27 MED ORDER — CHLORHEXIDINE GLUCONATE 4 % EX LIQD: 60.0000 mL | Freq: Once | CUTANEOUS | Status: DC

## 2013-09-27 MED ORDER — TRAMADOL HCL 50 MG PO TABS: 50.0000 mg | ORAL_TABLET | Freq: Four times a day (QID) | ORAL | Status: DC | PRN

## 2013-09-27 MED ORDER — AMLODIPINE BESYLATE 5 MG PO TABS
5.0000 mg | ORAL_TABLET | Freq: Every morning | ORAL | Status: DC
  Filled 2013-09-27: qty 1

## 2013-09-27 MED ORDER — PROPOFOL INFUSION 10 MG/ML OPTIME
INTRAVENOUS | Status: DC | PRN
  Administered 2013-09-27: 50 ug/kg/min via INTRAVENOUS

## 2013-09-27 MED ORDER — DEXAMETHASONE SODIUM PHOSPHATE 10 MG/ML IJ SOLN
INTRAMUSCULAR | Status: AC
  Filled 2013-09-27: qty 1

## 2013-09-27 MED ORDER — ENOXAPARIN SODIUM 40 MG/0.4ML ~~LOC~~ SOLN
40.0000 mg | SUBCUTANEOUS | Status: DC
  Administered 2013-09-28: 40 mg via SUBCUTANEOUS
  Filled 2013-09-27 (×2): qty 0.4

## 2013-09-27 MED ORDER — DOCUSATE SODIUM 100 MG PO CAPS
100.0000 mg | ORAL_CAPSULE | Freq: Two times a day (BID) | ORAL | Status: DC
  Administered 2013-09-27: 100 mg via ORAL
  Filled 2013-09-27 (×3): qty 1

## 2013-09-27 MED ORDER — METOPROLOL TARTRATE 50 MG PO TABS
75.0000 mg | ORAL_TABLET | Freq: Two times a day (BID) | ORAL | Status: DC
  Administered 2013-09-27: 75 mg via ORAL
  Filled 2013-09-27 (×3): qty 1

## 2013-09-27 MED ORDER — CLORAZEPATE DIPOTASSIUM 7.5 MG PO TABS: 7.5000 mg | ORAL_TABLET | Freq: Every day | ORAL | Status: DC | PRN

## 2013-09-27 MED ORDER — CLONAZEPAM 0.5 MG PO TABS: 0.5000 mg | ORAL_TABLET | Freq: Every evening | ORAL | Status: DC | PRN

## 2013-09-27 MED ORDER — CEFAZOLIN SODIUM-DEXTROSE 2-3 GM-% IV SOLR: 2.0000 g | INTRAVENOUS | Status: DC

## 2013-09-27 MED ORDER — PHENYLEPHRINE HCL 10 MG/ML IJ SOLN
10.0000 mg | INTRAVENOUS | Status: DC | PRN
  Administered 2013-09-27: 10 ug/min via INTRAVENOUS

## 2013-09-27 MED ORDER — DEXAMETHASONE SODIUM PHOSPHATE 10 MG/ML IJ SOLN: 10.0000 mg | Freq: Once | INTRAMUSCULAR | Status: DC

## 2013-09-27 MED ORDER — BUPIVACAINE LIPOSOME 1.3 % IJ SUSP
20.0000 mL | Freq: Once | INTRAMUSCULAR | Status: DC
  Filled 2013-09-27: qty 20

## 2013-09-27 MED ORDER — CALCIUM CARBONATE 600 MG PO TABS
600.0000 mg | ORAL_TABLET | Freq: Two times a day (BID) | ORAL | Status: DC
  Filled 2013-09-27: qty 1

## 2013-09-27 MED ORDER — ACETAMINOPHEN 10 MG/ML IV SOLN
1000.0000 mg | Freq: Once | INTRAVENOUS | Status: AC
  Administered 2013-09-27: 1000 mg via INTRAVENOUS
  Filled 2013-09-27: qty 100

## 2013-09-27 MED ORDER — OXYCODONE HCL 5 MG PO TABS: 5.0000 mg | ORAL_TABLET | Freq: Once | ORAL | Status: DC | PRN

## 2013-09-27 MED ORDER — HYDROCODONE-ACETAMINOPHEN 10-325 MG PO TABS: 1.0000 | ORAL_TABLET | ORAL | Status: DC | PRN

## 2013-09-27 MED ORDER — HYDROMORPHONE HCL PF 1 MG/ML IJ SOLN: 0.2500 mg | INTRAMUSCULAR | Status: DC | PRN

## 2013-09-27 MED ORDER — METHOCARBAMOL 500 MG PO TABS: 500.0000 mg | ORAL_TABLET | Freq: Four times a day (QID) | ORAL | Status: DC | PRN

## 2013-09-27 MED ORDER — ONDANSETRON HCL 4 MG/2ML IJ SOLN: 4.0000 mg | Freq: Four times a day (QID) | INTRAMUSCULAR | Status: DC | PRN

## 2013-09-27 MED ORDER — PHENYLEPHRINE HCL 10 MG/ML IJ SOLN
INTRAMUSCULAR | Status: DC | PRN
  Administered 2013-09-27 (×2): 120 ug via INTRAVENOUS

## 2013-09-27 MED ORDER — BUPIVACAINE HCL (PF) 0.25 % IJ SOLN
INTRAMUSCULAR | Status: DC | PRN
  Administered 2013-09-27: 20 mL

## 2013-09-27 MED ORDER — MIDAZOLAM HCL 2 MG/2ML IJ SOLN
INTRAMUSCULAR | Status: AC
  Filled 2013-09-27: qty 2

## 2013-09-27 MED ORDER — FENTANYL CITRATE 0.05 MG/ML IJ SOLN
INTRAMUSCULAR | Status: DC | PRN
  Administered 2013-09-27: 100 ug via INTRAVENOUS

## 2013-09-27 MED ORDER — HYDROCODONE-ACETAMINOPHEN 10-325 MG PO TABS
1.0000 | ORAL_TABLET | ORAL | Status: DC | PRN
  Administered 2013-09-27: 1 via ORAL
  Administered 2013-09-27 – 2013-09-28 (×2): 2 via ORAL
  Filled 2013-09-27: qty 2
  Filled 2013-09-27: qty 1
  Filled 2013-09-27: qty 2

## 2013-09-27 MED ORDER — ANASTROZOLE 1 MG PO TABS
1.0000 mg | ORAL_TABLET | Freq: Every day | ORAL | Status: DC
  Filled 2013-09-27 (×3): qty 1

## 2013-09-27 MED ORDER — MEPERIDINE HCL 50 MG/ML IJ SOLN: 6.2500 mg | INTRAMUSCULAR | Status: DC | PRN

## 2013-09-27 MED ORDER — CALCIUM CARBONATE 1250 (500 CA) MG PO TABS
1.0000 | ORAL_TABLET | Freq: Two times a day (BID) | ORAL | Status: DC
  Administered 2013-09-28: 500 mg via ORAL
  Filled 2013-09-27 (×3): qty 1

## 2013-09-27 SURGICAL SUPPLY — 39 items
ANCHOR SUPER QUICK (Anchor) ×9 IMPLANT
BAG ZIPLOCK 12X15 (MISCELLANEOUS) IMPLANT
BLADE EXTENDED COATED 6.5IN (ELECTRODE) IMPLANT
CLOSURE WOUND 1/2 X4 (GAUZE/BANDAGES/DRESSINGS) ×1
DRAPE INCISE IOBAN 66X45 STRL (DRAPES) ×3 IMPLANT
DRAPE ORTHO SPLIT 77X108 STRL (DRAPES) ×4
DRAPE POUCH INSTRU U-SHP 10X18 (DRAPES) ×3 IMPLANT
DRAPE SURG ORHT 6 SPLT 77X108 (DRAPES) ×2 IMPLANT
DRAPE U-SHAPE 47X51 STRL (DRAPES) ×3 IMPLANT
DRSG ADAPTIC 3X8 NADH LF (GAUZE/BANDAGES/DRESSINGS) ×3 IMPLANT
DRSG EMULSION OIL 3X16 NADH (GAUZE/BANDAGES/DRESSINGS) ×3 IMPLANT
DRSG MEPILEX BORDER 4X8 (GAUZE/BANDAGES/DRESSINGS) ×3 IMPLANT
ELECT REM PT RETURN 9FT ADLT (ELECTROSURGICAL) ×3
ELECTRODE REM PT RTRN 9FT ADLT (ELECTROSURGICAL) ×1 IMPLANT
GAUZE SPONGE 4X4 12PLY STRL (GAUZE/BANDAGES/DRESSINGS) ×3 IMPLANT
GLOVE BIO SURGEON STRL SZ7.5 (GLOVE) ×3 IMPLANT
GLOVE BIO SURGEON STRL SZ8 (GLOVE) ×3 IMPLANT
GLOVE BIOGEL PI IND STRL 8 (GLOVE) ×2 IMPLANT
GLOVE BIOGEL PI INDICATOR 8 (GLOVE) ×4
GOWN STRL REUS W/TWL LRG LVL3 (GOWN DISPOSABLE) ×3 IMPLANT
GOWN STRL REUS W/TWL XL LVL3 (GOWN DISPOSABLE) ×3 IMPLANT
KIT BASIN OR (CUSTOM PROCEDURE TRAY) ×3 IMPLANT
MANIFOLD NEPTUNE II (INSTRUMENTS) ×3 IMPLANT
NDL SAFETY ECLIPSE 18X1.5 (NEEDLE) ×2 IMPLANT
NEEDLE HYPO 18GX1.5 SHARP (NEEDLE) ×4
NEEDLE MAYO .5 CIRCLE (NEEDLE) ×3 IMPLANT
NS IRRIG 1000ML POUR BTL (IV SOLUTION) ×3 IMPLANT
PACK TOTAL JOINT (CUSTOM PROCEDURE TRAY) ×3 IMPLANT
POSITIONER SURGICAL ARM (MISCELLANEOUS) ×3 IMPLANT
STAPLER VISISTAT 35W (STAPLE) IMPLANT
STRIP CLOSURE SKIN 1/2X4 (GAUZE/BANDAGES/DRESSINGS) ×2 IMPLANT
SUT MNCRL AB 4-0 PS2 18 (SUTURE) ×3 IMPLANT
SUT VIC AB 2-0 CT1 27 (SUTURE) ×4
SUT VIC AB 2-0 CT1 TAPERPNT 27 (SUTURE) ×2 IMPLANT
SUT VLOC 180 0 24IN GS25 (SUTURE) ×3 IMPLANT
SYRINGE 20CC LL (MISCELLANEOUS) ×3 IMPLANT
SYRINGE 60CC LL (MISCELLANEOUS) ×3 IMPLANT
TAPE STRIPS DRAPE STRL (GAUZE/BANDAGES/DRESSINGS) ×3 IMPLANT
TOWEL OR 17X26 10 PK STRL BLUE (TOWEL DISPOSABLE) ×6 IMPLANT

## 2013-09-27 NOTE — Anesthesia Preprocedure Evaluation (Addendum)
Anesthesia Evaluation  Patient identified by MRN, date of birth, ID band Patient awake    Reviewed: Allergy & Precautions, H&P , NPO status , Patient's Chart, lab work & pertinent test results, reviewed documented beta blocker date and time   Airway Mallampati: I TM Distance: >3 FB Neck ROM: Full    Dental  (+) Teeth Intact, Dental Advisory Given   Pulmonary neg pulmonary ROS,  breath sounds clear to auscultation        Cardiovascular hypertension, Pt. on medications and Pt. on home beta blockers Rhythm:Regular Rate:Normal     Neuro/Psych PSYCHIATRIC DISORDERS Anxiety negative neurological ROS     GI/Hepatic negative GI ROS, Neg liver ROS,   Endo/Other  negative endocrine ROS  Renal/GU negative Renal ROS     Musculoskeletal negative musculoskeletal ROS (+)   Abdominal   Peds  Hematology negative hematology ROS (+)   Anesthesia Other Findings   Reproductive/Obstetrics negative OB ROS                          Anesthesia Physical  Anesthesia Plan  ASA: III  Anesthesia Plan: Spinal   Post-op Pain Management:    Induction:   Airway Management Planned:   Additional Equipment:   Intra-op Plan:   Post-operative Plan:   Informed Consent: I have reviewed the patients History and Physical, chart, labs and discussed the procedure including the risks, benefits and alternatives for the proposed anesthesia with the patient or authorized representative who has indicated his/her understanding and acceptance.   Dental advisory given  Plan Discussed with: CRNA  Anesthesia Plan Comments:        Anesthesia Quick Evaluation

## 2013-09-27 NOTE — Anesthesia Procedure Notes (Signed)
Spinal  Patient location during procedure: OR Staffing Anesthesiologist: Nolon Nations R Performed by: anesthesiologist  Preanesthetic Checklist Completed: patient identified, site marked, surgical consent, pre-op evaluation, timeout performed, IV checked, risks and benefits discussed and monitors and equipment checked Spinal Block Patient position: sitting Prep: Betadine Patient monitoring: heart rate, continuous pulse ox and blood pressure Approach: left paramedian Location: L2-3 Injection technique: single-shot Needle Needle type: Quincke  Needle gauge: 22 G Needle length: 9 cm Additional Notes Expiration date of kit checked and confirmed. Patient tolerated procedure well, without complications.

## 2013-09-27 NOTE — Anesthesia Postprocedure Evaluation (Signed)
Anesthesia Post Note  Patient: Victoria Moran  Procedure(s) Performed: Procedure(s) (LRB): RIGHT HIP BURSECTOMY/GLUTEAL TENDON REPAIR (Right)  Anesthesia type: Spinal  Patient location: PACU  Post pain: Pain level controlled  Post assessment: Post-op Vital signs reviewed  Last Vitals: BP 159/60  Pulse 70  Temp(Src) 36.5 C (Oral)  Resp 16  Ht 5\' 2"  (1.575 m)  Wt 139 lb (63.05 kg)  BMI 25.42 kg/m2  SpO2 98%  Post vital signs: Reviewed  Level of consciousness: sedated  Complications: No apparent anesthesia complications

## 2013-09-27 NOTE — Transfer of Care (Signed)
Immediate Anesthesia Transfer of Care Note  Patient: Victoria Moran  Procedure(s) Performed: Procedure(s) (LRB): RIGHT HIP BURSECTOMY/GLUTEAL TENDON REPAIR (Right)  Patient Location: PACU  Anesthesia Type: Spinal  Level of Consciousness: sedated, patient cooperative and responds to stimulation  Airway & Oxygen Therapy: Patient Spontanous Breathing and Patient connected to face mask oxgen  Post-op Assessment: Report given to PACU RN and Post -op Vital signs reviewed and stable  Post vital signs: Reviewed and stable  Complications: No apparent anesthesia complications

## 2013-09-27 NOTE — Brief Op Note (Signed)
09/27/2013  4:19 PM  PATIENT:  Victoria Moran  78 y.o. female  PRE-OPERATIVE DIAGNOSIS:  RIGHT HIP GLUTEAL TENDON TEAR   POST-OPERATIVE DIAGNOSIS:  right hip gluteal tendon tear  PROCEDURE:  Procedure(s): RIGHT HIP BURSECTOMY/GLUTEAL TENDON REPAIR (Right)  SURGEON:  Surgeon(s) and Role:    * Gearlean Alf, MD - Primary  PHYSICIAN ASSISTANT:   ASSISTANTS: [name], PA-C   ANESTHESIA:   spinal  EBL:  Total I/O In: -  Out: 100 [Blood:100]  DRAINS: none   LOCAL MEDICATIONS USED:  OTHER Exparel  COUNTS:  YES  TOURNIQUET:  * No tourniquets in log *  DICTATION: .Other Dictation: Dictation Number [id number]  PLAN OF CARE: Admit for overnight observation  PATIENT DISPOSITION:  PACU - hemodynamically stable.

## 2013-09-27 NOTE — Interval H&P Note (Signed)
History and Physical Interval Note:  09/27/2013 2:43 PM  Victoria Moran  has presented today for surgery, with the diagnosis of RIGHT HIP GLUTEAL TENDON TEAR   The various methods of treatment have been discussed with the patient and family. After consideration of risks, benefits and other options for treatment, the patient has consented to  Procedure(s): RIGHT HIP BURSECTOMY/GLUTEAL TENDON REPAIR (Right) as a surgical intervention .  The patient's history has been reviewed, patient examined, no change in status, stable for surgery.  I have reviewed the patient's chart and labs.  Questions were answered to the patient's satisfaction.     Gearlean Alf

## 2013-09-28 MED ORDER — ENOXAPARIN SODIUM 40 MG/0.4ML ~~LOC~~ SOLN: 40.0000 mg | SUBCUTANEOUS | Status: DC

## 2013-09-28 NOTE — Op Note (Signed)
Victoria Moran, Victoria Moran             ACCOUNT NO.:  [id number]  MEDICAL RECORD NO.:  [id number]  LOCATION:  70                         FACILITY:  [facility]  PHYSICIAN:  Gaynelle Arabian, M.D.    DATE OF BIRTH:  Jun 24, 1935  DATE OF PROCEDURE:  09/27/2013 DATE OF DISCHARGE:                              OPERATIVE REPORT   PREOPERATIVE DIAGNOSIS:  Right hip intractable bursitis with gluteal tendon tear.  POSTOPERATIVE DIAGNOSIS:  Right hip intractable bursitis with gluteal tendon tear.  PROCEDURE:  Right hip bursectomy with gluteal tendon repair.  SURGEON:  Gaynelle Arabian, MD  ASSISTANT:  Alexzandrew L. Perkins, PA-C  ANESTHESIA:  Spinal.  ESTIMATED BLOOD LOSS:  Minimal.  DRAINS:  None.  COMPLICATIONS:  None.  CONDITION:  Stable to recovery.  BRIEF CLINICAL NOTE:  [name] is a 78 year old female, significant right lateral hip pain and dysfunction.  She has had nonoperative management including injections and therapy.  She has a significant gluteus medius tendon tear.  She presents now for repair and bursectomy.  PROCEDURE IN DETAIL:  After successful administration of spinal anesthetic, the patient was placed in left lateral decubitus position with the right side up and held with the hip positioner.  Right lower extremity was isolated from her perineum with plastic drapes and prepped and draped in the usual sterile fashion.  Lateral based incision was then made centered over the tip of the greater trochanter.  Skin was cut with 10 blade through subcutaneous tissue to the fascia lata which was incised in line with the skin incision.  There was a large amount of fluid present and we cut through the fascia lata.  There was a hypertrophic calcified bursa which was excised.  The gluteus medius tendon was torn badly.  The anterior 2/3 of the tendon were completely torn off the greater trochanter.  A trough was created in the greater trochanter and 3 Mitek anchors as necessary to  fix this tendon.  The sutures were passed through the free edge of the tendon which was advanced down to the greater trochanter and then oversewn through the trough.  This was felt to be a very stable repair.  The sutures were tied to each other to increase the stability.  The wound was then copiously irrigated with saline solution.  The fascia lata was closed with running #1 V-Loc suture leaving open a triangular area over the tip of the greater trochanter to prevent friction and rubbing.  Subcu was closed with 2-0 Vicryl and subcuticular running 4-0 Monocryl.  Prior to closure, a total of 20 mL Exparel with 40 mL of saline was injected into the gluteal muscles, the fascia lata, and the subcu tissues.  Additional 20 mL of Marcaine injected into the same tissues.  Once closed then the incision was cleaned and dried. Steri-Strips and a bulky sterile dressing applied.  She was then awakened and transported to recovery in stable condition.     Gaynelle Arabian, M.D.     FA/MEDQ  D:  09/27/2013  T:  09/28/2013  Job:  [id number]

## 2013-09-28 NOTE — Progress Notes (Signed)
Subjective: 1 Day Post-Op Procedure(s) (LRB): RIGHT HIP BURSECTOMY/GLUTEAL TENDON REPAIR (Right) Patient reports pain as mild.   Had irritation right eyelid from oxygen tubing Objective: Vital signs in last 24 hours: Temp:  [97.7 F (36.5 C)-99.2 F (37.3 C)] 98.1 F (36.7 C) (07/18 0523) Pulse Rate:  [70-88] 70 (07/18 0523) Resp:  [10-18] 18 (07/18 0523) BP: (110-164)/(41-97) 133/43 mmHg (07/18 0523) SpO2:  [96 %-100 %] 99 % (07/18 0523) Weight:  [139 lb (63.05 kg)] 139 lb (63.05 kg) (07/17 1218)  Intake/Output from previous day: 07/17 0701 - 07/18 0700 In: 2655 [I.V.:2550; IV Piggyback:105] Out: 1800 [Blood:100] Intake/Output this shift:    No results found for this basename: HGB,  in the last 72 hours No results found for this basename: WBC, RBC, HCT, PLT,  in the last 72 hours No results found for this basename: NA, K, CL, CO2, BUN, CREATININE, GLUCOSE, CALCIUM,  in the last 72 hours No results found for this basename: LABPT, INR,  in the last 72 hours  Neurologically intact Neurovascular intact No cellulitis present Compartment soft  Assessment/Plan: 1 Day Post-Op Procedure(s) (LRB): RIGHT HIP BURSECTOMY/GLUTEAL TENDON REPAIR (Right) D/C IV fluids Discharge home  [place] V 09/28/2013, 8:10 AM

## 2013-10-01 ENCOUNTER — Encounter (HOSPITAL_COMMUNITY): Payer: Self-pay | Admitting: Orthopedic Surgery

## 2013-10-10 NOTE — Discharge Summary (Signed)
Physician Discharge Summary   Patient ID: Victoria Moran MRN: [id number] DOB/AGE: 76/04/1935 76 y.o.  Admit date: 09/27/2013 Discharge date: 09/28/2013  Primary Diagnosis:  Right hip intractable bursitis with gluteal tendon tear.  Admission Diagnoses:  Past Medical History  Diagnosis Date  . Arthritis   . Hypertension   . Cancer 2009  . Anxiety   . Gluteal pain     glutearl tear rt hip  . Difficulty sleeping   . Osteopenia    Discharge Diagnoses:   Principal Problem:   Tear of right gluteus minimus tendon Active Problems:   Rupture of gluteus minimus tendon  Estimated body mass index is 25.42 kg/(m^2) as calculated from the following:   Height as of this encounter: _0  (1.575 m).   Weight as of this encounter: 63.05 kg (139 lb).  Procedure(s) (LRB): RIGHT HIP BURSECTOMY/GLUTEAL TENDON REPAIR (Right)   Consults: None  HPI: Ms. Victoria Moran is a 78 year old female, significant  right lateral hip pain and dysfunction. She has had nonoperative  management including injections and therapy. She has a significant  gluteus medius tendon tear. She presents now for repair and bursectomy.  Laboratory Data: Hospital Outpatient Visit on 09/20/2013  Component Date Value Ref Range Status  . Sodium 09/20/2013 141  137 - 147 mEq/L Final  . Potassium 09/20/2013 4.4  3.7 - 5.3 mEq/L Final  . Chloride 09/20/2013 104  96 - 112 mEq/L Final  . CO2 09/20/2013 28  19 - 32 mEq/L Final  . Glucose, Bld 09/20/2013 77  70 - 99 mg/dL Final  . BUN 09/20/2013 20  6 - 23 mg/dL Final  . Creatinine, Ser 09/20/2013 1.18* 0.50 - 1.10 mg/dL Final  . Calcium 09/20/2013 9.4  8.4 - 10.5 mg/dL Final  . GFR calc non Af Amer 09/20/2013 44* >90 mL/min Final  . GFR calc Af Amer 09/20/2013 51* >90 mL/min Final   Comment: (NOTE)                          The eGFR has been calculated using the CKD EPI equation.                          This calculation has not been validated in all clinical situations.                   eGFR's persistently <90 mL/min signify possible Chronic Kidney                          Disease.  . Anion gap 09/20/2013 9  5 - 15 Final  . WBC 09/20/2013 9.0  4.0 - 10.5 K/uL Final  . RBC 09/20/2013 3.55* 3.87 - 5.11 MIL/uL Final  . Hemoglobin 09/20/2013 11.7* 12.0 - 15.0 g/dL Final  . HCT 09/20/2013 35.4* 36.0 - 46.0 % Final  . MCV 09/20/2013 99.7  78.0 - 100.0 fL Final  . MCH 09/20/2013 33.0  26.0 - 34.0 pg Final  . MCHC 09/20/2013 33.1  30.0 - 36.0 g/dL Final  . RDW 09/20/2013 13.1  11.5 - 15.5 % Final  . Platelets 09/20/2013 283  150 - 400 K/uL Final     X-Rays:Dg Chest 2 View  09/20/2013   CLINICAL DATA:  Pre-op evaluation.  EXAM: CHEST  2 VIEW  COMPARISON:  03/01/2012  FINDINGS: Patient appears to have had bilateral mastectomies. Surgical clips in the axillary regions bilaterally.  The lungs are clear bilaterally. Heart and mediastinum are within normal limits. The trachea is midline. No acute bone abnormality. There is disc disease in the upper lumbar spine region.  IMPRESSION: No active cardiopulmonary disease.   Electronically Signed   By: Markus Daft M.D.   On: 09/20/2013 10:03    EKG: Orders placed during the hospital encounter of 09/27/13  . EKG     Hospital Course: Patient was admitted to [facility] and taken to the OR and underwent the above state procedure without complications.  Patient tolerated the procedure well and was later transferred to the recovery room and then to the orthopaedic floor for postoperative care.  They were given PO and IV analgesics for pain control following their surgery.  They were given 24 hours of postoperative antibiotics of  Anti-infectives   Start     Dose/Rate Route Frequency Ordered Stop   09/27/13 2200  ceFAZolin (ANCEF) IVPB 2 g/50 mL premix     2 g 100 mL/hr over 30 Minutes Intravenous Every 6 hours 09/27/13 1818 09/28/13 0352   09/27/13 1315  ceFAZolin (ANCEF) IVPB 2 g/50 mL premix  Status:  Discontinued     2  g 100 mL/hr over 30 Minutes Intravenous On call to O.R. 09/27/13 1253 09/27/13 1802    The patient was allowed to be WBAT with therapy. Discharge planning was consulted to help with postop disposition and equipment needs.  Patient had a good night on the evening of surgery.  They started to get up OOB on day one.  Patient was seen in rounds by Dr. Wynelle Link and was ready to go home.   Diet: Cardiac diet Activity:WBAT No active abduction of the hip. Follow-up:in 2 weeks Disposition - Home Discharged Condition: good   Discharge Instructions   Call MD / Call 911    Complete by:  As directed   If you experience chest pain or shortness of breath, CALL 911 and be transported to the hospital emergency room.  If you develope a fever above 101 F, pus (white drainage) or increased drainage or redness at the wound, or calf pain, call your surgeon's office.     Discharge instructions    Complete by:  As directed   You may remove your large bandage and shower in 2 days  Take an 81 mg Aspirin daily for the next 4 weeks  You may bear full weight on your right leg     Increase activity slowly as tolerated    Complete by:  As directed             Medication List         amLODipine 5 MG tablet  Commonly known as:  NORVASC  Take 5 mg by mouth every morning.     anastrozole 1 MG tablet  Commonly known as:  ARIMIDEX  Take 1 mg by mouth daily.     calcium carbonate 600 MG Tabs tablet  Commonly known as:  OS-CAL  Take 600 mg by mouth 2 (two) times daily with a meal.     cholecalciferol 1000 UNITS tablet  Commonly known as:  VITAMIN D  Take 1,000 Units by mouth daily.     clonazePAM 1 MG tablet  Commonly known as:  KLONOPIN  Take 0.5-1 mg by mouth at bedtime as needed (Sleep).     clorazepate 7.5 MG tablet  Commonly known as:  TRANXENE  Take 7.5 mg by mouth daily as needed for sleep.  cyclobenzaprine 10 MG tablet  Commonly known as:  FLEXERIL  Take 10 mg by mouth at bedtime as  needed for muscle spasms.     docusate sodium 100 MG capsule  Commonly known as:  COLACE  Take 100 mg by mouth daily as needed for constipation.     GLUCOSAMINE PO  Take 1 Can by mouth daily.     HYDROcodone-acetaminophen 10-325 MG per tablet  Commonly known as:  NORCO  Take 1-2 tablets by mouth every 4 (four) hours as needed (Pain).     MACULAR VITAMIN BENEFIT Tabs  Take 2 tablets by mouth 2 (two) times daily.     metoprolol 50 MG tablet  Commonly known as:  LOPRESSOR  Take 75 mg by mouth 2 (two) times daily. Take 1 1/2 of 50 mg=50m BID     nabumetone 750 MG tablet  Commonly known as:  RELAFEN  Take 750 mg by mouth 2 (two) times daily.     predniSONE 5 MG tablet  Commonly known as:  DELTASONE  Take 5 mg by mouth daily.           Follow-up Information   Follow up with AGearlean Alf MD. Schedule an appointment as soon as possible for a visit on 10/11/2013. (Call [phone] to make the appointment)    Specialty:  Orthopedic Surgery   Contact information:   [phone]      Signed: DArlee Muslim PA-C Orthopaedic Surgery 10/10/2013, 8:33 AM

## 2014-12-17 DIAGNOSIS — C50912 Malignant neoplasm of unspecified site of left female breast: Secondary | ICD-10-CM | POA: Diagnosis not present

## 2014-12-17 DIAGNOSIS — Z853 Personal history of malignant neoplasm of breast: Secondary | ICD-10-CM | POA: Diagnosis not present

## 2014-12-17 DIAGNOSIS — M1990 Unspecified osteoarthritis, unspecified site: Secondary | ICD-10-CM | POA: Diagnosis not present

## 2014-12-17 DIAGNOSIS — M8580 Other specified disorders of bone density and structure, unspecified site: Secondary | ICD-10-CM | POA: Diagnosis not present

## 2014-12-19 ENCOUNTER — Ambulatory Visit: Payer: Self-pay | Admitting: Orthopedic Surgery

## 2014-12-19 NOTE — Progress Notes (Signed)
Preoperative surgical orders have been place into the Epic hospital system for Victoria Moran on 12/19/2014, 5:24 PM  by Mickel Crow for surgery on 12-30-2014.  Preop Hip orders including Experel Injecion, IV Tylenol, and IV Decadron as long as there are no contraindications to the above medications. Victoria Muslim, PA-C

## 2014-12-24 ENCOUNTER — Encounter (HOSPITAL_BASED_OUTPATIENT_CLINIC_OR_DEPARTMENT_OTHER): Payer: Self-pay | Admitting: *Deleted

## 2014-12-24 NOTE — Progress Notes (Signed)
NPO AFTER MN WITH EXCEPTION CLEAR LIQUIDS UNTIL 0900 (NO CREAM/ MILK PRODUCTS).  ARRIVE AT 1300.  NEEDS ISTAT AND EKG. WILL TAKE METOPROLOL, NORVASC, PREDNISONE, AND TRAMADOL AM DOS W/ SIPS OF WATER.

## 2014-12-30 ENCOUNTER — Ambulatory Visit (HOSPITAL_BASED_OUTPATIENT_CLINIC_OR_DEPARTMENT_OTHER): Admission: RE | Admit: 2014-12-30 | Payer: Self-pay | Source: Ambulatory Visit | Admitting: Orthopedic Surgery

## 2014-12-30 HISTORY — DX: Unspecified macular degeneration: H35.30

## 2014-12-30 HISTORY — DX: Unspecified disorder of synovium and tendon, left thigh: M67.952

## 2014-12-30 HISTORY — DX: Personal history of malignant neoplasm of breast: Z85.3

## 2014-12-30 HISTORY — DX: Insomnia, unspecified: G47.00

## 2014-12-30 SURGERY — REPAIR, TENDON, GLUTEUS MEDIUS, OPEN
Anesthesia: Choice | Laterality: Left

## 2015-02-12 ENCOUNTER — Ambulatory Visit: Payer: Self-pay | Admitting: Orthopedic Surgery

## 2015-02-12 NOTE — Progress Notes (Signed)
Preoperative surgical orders have been place into the Epic hospital system for Victoria Moran on 02/12/2015, 12:17 PM  by Mickel Crow for surgery on 03-18-2015.  Preop Hip orders including Experel Injecion, IV Tylenol, and IV Decadron as long as there are no contraindications to the above medications. [name], PA-C

## 2015-03-06 NOTE — Patient Instructions (Addendum)
YOUR PROCEDURE IS SCHEDULED ON :  03/17/14  REPORT TO [facility] MAIN ENTRANCE FOLLOW SIGNS TO EAST ELEVATOR - GO TO 3rd FLOOR CHECK IN AT 3 EAST NURSES STATION (SHORT STAY) AT:  1:15 PM  CALL THIS NUMBER IF YOU HAVE PROBLEMS THE MORNING OF SURGERY [phone]  REMEMBER:ONLY 1 PER PERSON MAY GO TO SHORT STAY WITH YOU TO GET READY THE MORNING OF YOUR SURGERY  DO NOT EAT FOOD  AFTER MIDNIGHT  MAY HAVE CLEAR LIQUIDS UNTIL 9:15 AM  TAKE THESE MEDICINES THE MORNING OF SURGERY: ARIMIDEX / AMLODIPINE / METOPROLOL / PREDNISONE / MAY TAKE OXYCODONE IF NEEDED  CLEAR LIQUID DIET  Foods Allowed                                                                     Foods Excluded  Coffee and tea, regular and decaf                             liquids that you cannot  Plain Jell-O in any flavor                                             see through such as: Fruit ices (not with fruit pulp)                                     milk, soups, orange juice  Iced Popsicles                                                      All solid food Carbonated beverages, regular and diet                                    Cranberry, grape and apple juices Sports drinks like Gatorade Lightly seasoned clear broth or consume(fat free) Sugar, honey syrup  _____________________________________________________________________    YOU MAY NOT HAVE ANY METAL ON YOUR BODY INCLUDING HAIR PINS AND PIERCING'S. DO NOT WEAR JEWELRY, MAKEUP, LOTIONS, POWDERS OR PERFUMES. DO NOT WEAR NAIL POLISH. DO NOT SHAVE 48 HRS PRIOR TO SURGERY. MEN MAY SHAVE FACE AND NECK.  DO NOT [place]. [facility] IS NOT RESPONSIBLE FOR VALUABLES.  CONTACTS, DENTURES OR PARTIALS MAY NOT BE WORN TO SURGERY. LEAVE SUITCASE IN CAR. CAN BE BROUGHT TO ROOM AFTER SURGERY.  PATIENTS DISCHARGED THE DAY OF SURGERY WILL NOT BE ALLOWED TO DRIVE HOME.  PLEASE READ OVER THE FOLLOWING INSTRUCTION  SHEETS _________________________________________________________________________________                                          [place]  Before surgery, you can play an important role.  Because skin is not sterile, your skin needs to be as free of germs as possible.  You can reduce the number of germs on your skin by washing with CHG (chlorahexidine gluconate) soap before surgery.  CHG is an antiseptic cleaner which kills germs and bonds with the skin to continue killing germs even after washing. Please DO NOT use if you have an allergy to CHG or antibacterial soaps.  If your skin becomes reddened/irritated stop using the CHG and inform your nurse when you arrive at Short Stay. Do not shave (including legs and underarms) for at least 48 hours prior to the first CHG shower.  You may shave your face. Please follow these instructions carefully:   1.  Shower with CHG Soap the night before surgery and the  morning of Surgery.   2.  If you choose to wash your hair, wash your hair first as usual with your  normal  Shampoo.   3.  After you shampoo, rinse your hair and body thoroughly to remove the  shampoo.                                         4.  Use CHG as you would any other liquid soap.  You can apply chg directly  to the skin and wash . Gently wash with scrungie or clean wascloth    5.  Apply the CHG Soap to your body ONLY FROM THE NECK DOWN.   Do not use on open                           Wound or open sores. Avoid contact with eyes, ears mouth and genitals (private parts).                        Genitals (private parts) with your normal soap.              6.  Wash thoroughly, paying special attention to the area where your surgery  will be performed.   7.  Thoroughly rinse your body with warm water from the neck down.   8.  DO NOT shower/wash with your normal soap after using and rinsing off  the CHG Soap .                9.  Pat yourself dry with a clean  towel.             10.  Wear clean night clothes to bed after shower             11.  Place clean sheets on your bed the night of your first shower and do not  sleep with pets.  Day of Surgery : Do not apply any lotions/deodorants the morning of surgery.  Please wear clean clothes to the hospital/surgery center.  FAILURE TO FOLLOW THESE INSTRUCTIONS MAY RESULT IN THE CANCELLATION OF YOUR SURGERY    PATIENT SIGNATURE_________________________________  ______________________________________________________________________     Victoria Moran  An incentive spirometer is a tool that can help keep your lungs clear and active. This tool measures how well you are filling your lungs with each breath. Taking long deep breaths may help reverse or decrease the chance of developing breathing (pulmonary) problems (especially infection) following:  A long  period of time when you are unable to move or be active. BEFORE THE PROCEDURE   If the spirometer includes an indicator to show your best effort, your nurse or respiratory therapist will set it to a desired goal.  If possible, sit up straight or lean slightly forward. Try not to slouch.  Hold the incentive spirometer in an upright position. INSTRUCTIONS FOR USE  1. Sit on the edge of your bed if possible, or sit up as far as you can in bed or on a chair. 2. Hold the incentive spirometer in an upright position. 3. Breathe out normally. 4. Place the mouthpiece in your mouth and seal your lips tightly around it. 5. Breathe in slowly and as deeply as possible, raising the piston or the ball toward the top of the column. 6. Hold your breath for 3-5 seconds or for as long as possible. Allow the piston or ball to fall to the bottom of the column. 7. Remove the mouthpiece from your mouth and breathe out normally. 8. Rest for a few seconds and repeat Steps 1 through 7 at least 10 times every 1-2 hours when you are awake. Take your time and take  a few normal breaths between deep breaths. 9. The spirometer may include an indicator to show your best effort. Use the indicator as a goal to work toward during each repetition. 10. After each set of 10 deep breaths, practice coughing to be sure your lungs are clear. If you have an incision (the cut made at the time of surgery), support your incision when coughing by placing a pillow or rolled up towels firmly against it. Once you are able to get out of bed, walk around indoors and cough well. You may stop using the incentive spirometer when instructed by your caregiver.  RISKS AND COMPLICATIONS  Take your time so you do not get dizzy or light-headed.  If you are in pain, you may need to take or ask for pain medication before doing incentive spirometry. It is harder to take a deep breath if you are having pain. AFTER USE  Rest and breathe slowly and easily.  It can be helpful to keep track of a log of your progress. Your caregiver can provide you with a simple table to help with this. If you are using the spirometer at home, follow these instructions: [place] IF:   You are having difficultly using the spirometer.  You have trouble using the spirometer as often as instructed.  Your pain medication is not giving enough relief while using the spirometer.  You develop fever of 100.5 F (38.1 C) or higher. SEEK IMMEDIATE MEDICAL CARE IF:   You cough up bloody sputum that had not been present before.  You develop fever of 102 F (38.9 C) or greater.  You develop worsening pain at or near the incision site. MAKE SURE YOU:   Understand these instructions.  Will watch your condition.  Will get help right away if you are not doing well or get worse. Document Released: 07/11/2006 Document Revised: 05/23/2011 Document Reviewed: 09/11/2006 [facility] Patient Information 2014 [place], [place].   ________________________________________________________________________

## 2015-03-11 ENCOUNTER — Encounter (HOSPITAL_COMMUNITY)
Admission: RE | Admit: 2015-03-11 | Discharge: 2015-03-11 | Disposition: A | Discharge status: Home / Self Care | Payer: Medicare Other | Source: Ambulatory Visit | Attending: Orthopedic Surgery | Admitting: Orthopedic Surgery

## 2015-03-11 ENCOUNTER — Encounter (HOSPITAL_COMMUNITY): Payer: Self-pay

## 2015-03-11 DIAGNOSIS — Z01818 Encounter for other preprocedural examination: Secondary | ICD-10-CM | POA: Insufficient documentation

## 2015-03-11 DIAGNOSIS — M71551 Other bursitis, not elsewhere classified, right hip: Secondary | ICD-10-CM | POA: Diagnosis not present

## 2015-03-11 DIAGNOSIS — M7600 Gluteal tendinitis, unspecified hip: Secondary | ICD-10-CM | POA: Insufficient documentation

## 2015-03-11 HISTORY — DX: Effusion, left ankle: M25.471

## 2015-03-11 HISTORY — DX: Effusion, left ankle: M25.472

## 2015-03-11 LAB — CBC
HCT: 38.8 % (ref 36.0–46.0)
Hemoglobin: 12.8 g/dL (ref 12.0–15.0)
MCH: 33.2 pg (ref 26.0–34.0)
MCHC: 33 g/dL (ref 30.0–36.0)
MCV: 100.5 fL — ABNORMAL HIGH (ref 78.0–100.0)
PLATELETS: 315 10*3/uL (ref 150–400)
RBC: 3.86 MIL/uL — AB (ref 3.87–5.11)
RDW: 13.6 % (ref 11.5–15.5)
WBC: 12 10*3/uL — ABNORMAL HIGH (ref 4.0–10.5)

## 2015-03-17 DIAGNOSIS — M67952 Unspecified disorder of synovium and tendon, left thigh: Secondary | ICD-10-CM | POA: Diagnosis present

## 2015-03-17 NOTE — H&P (Signed)
CC- Victoria Moran is a 80 y.o. female who presents with left hip pain  Hip Pain: Patient complains of left hip pain. Onset of the symptoms was several months ago. Inciting event: none. Current symptoms include lateral hip pain and limp. Associated symptoms: none. Aggravating symptoms: going up and down stairs and rising after sitting. Patient's has gotten progressively worse in past several months. Patient has had prior hip problems with the right hip. . Evaluation to date: MRI with gluteal tendon tear.  Treatment to date: injection which did not help pain.  Past Medical History  Diagnosis Date  . Arthritis   . Hypertension   . Osteopenia   . 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 Tendinopathy of left gluteus medius     hip  . Insomnia   . Macular degeneration of both eyes   . Swelling of both ankles   . Cancer ([place])     hx bilateral breast cancer  . Anxiety     Past Surgical History  Procedure Laterality Date  . Rotator cuff repair Right ?2005  . Elbow surgery Left 2003  . Decompressive lumbar laminectomy level 2  03/08/2012    Procedure: DECOMPRESSIVE LUMBAR LAMINECTOMY LEVEL 2;  Surgeon: Melina Schools, MD;  Location: [facility];  Service: Orthopedics;  Laterality: N/A;  Decompression L3-L5   . Mastectomy w/ sentinel node biopsy Left 07/04/2012    Procedure: MASTECTOMY WITH SENTINEL LYMPH NODE BIOPSY;  Surgeon: Stark Klein, MD;  Location: [facility];  Service: General;  Laterality: Left;  . Open surgical repair of gluteal tendon Right 09/27/2013    Procedure: RIGHT HIP BURSECTOMY/GLUTEAL TENDON REPAIR;  Surgeon: Gearlean Alf, MD;  Location: [facility];  Service: Orthopedics;  Laterality: Right;  . Total mastectomy Right 08-08-2007    w/  SLN bx  . Left foot surgery  01-09-2008    left great  toe and left second toe  . Knee arthroscopy Left   . Cataract extraction w/ intraocular lens  implant, bilateral      Prior to Admission medications   Medication Sig Start Date End Date Taking? Authorizing Provider  acetaminophen (TYLENOL) 500 MG tablet Take 1,000 mg by mouth every 6 (six) hours as needed for mild pain.   Yes Historical Provider, MD  amLODipine (NORVASC) 5 MG tablet Take 5 mg by mouth every morning.    Yes Historical Provider, MD  anastrozole (ARIMIDEX) 1 MG tablet Take 1 mg by mouth every morning.    Yes Historical Provider, MD  calcium carbonate (OS-CAL) 600 MG TABS Take 600 mg by mouth daily with breakfast.    Yes Historical Provider, MD  cholecalciferol (VITAMIN D) 1000 UNITS tablet Take 1,000 Units by mouth daily.   Yes Historical Provider, MD  clorazepate (TRANXENE) 7.5 MG tablet Take 7.5 mg by mouth at bedtime.    Yes Historical Provider, MD  cyclobenzaprine (FLEXERIL) 10 MG tablet Take 10 mg by mouth at bedtime.  03/09/12  Yes Melina Schools, MD  docusate sodium (COLACE) 100 MG capsule Take 100 mg by mouth daily.  03/09/12  Yes Melina Schools, MD  furosemide (LASIX) 20 MG tablet Take 20 mg by mouth daily. 02/07/15  Yes Historical Provider, MD  GLUCOSAMINE-CHONDROIT-VIT C-MN PO Take by mouth daily. Liquid supplement-  patient takes 1 capful daily   Yes Historical Provider, MD  metoprolol (LOPRESSOR) 50 MG tablet Take 75 mg by mouth 2 (two) times daily.    Yes Historical Provider, MD  Multiple Vitamins-Minerals (MACULAR VITAMIN BENEFIT) TABS Take 2 tablets by mouth 2 (two) times daily.   Yes Historical Provider, MD  nabumetone (RELAFEN) 750 MG tablet Take 750 mg by mouth 2 (two) times daily.   Yes Historical Provider, MD  oxyCODONE (OXY IR/ROXICODONE) 5 MG immediate release tablet Take 5 mg by mouth every 4 (four) hours as needed. for pain 01/15/15  Yes Historical Provider, MD  predniSONE (DELTASONE) 5 MG tablet Take 5 mg by mouth every morning.    Yes Historical Provider, MD     Physical Examination: General appearance - alert, well appearing, and in no distress Mental status - alert, oriented to person, place, and time Chest - clear to auscultation, no wheezes, rales or rhonchi, symmetric air entry Heart - normal rate, regular rhythm, normal S1, S2, no murmurs, rubs, clicks or gallops Abdomen - soft, nontender, nondistended, no masses or organomegaly Neurological - alert, oriented, normal speech, no focal findings or movement disorder noted  A left hip exam was performed. GENERAL: no acute distress SKIN: intact SWELLING: none WARMTH: no warmth TENDERNESS: maximal at greater trochanter  ROM: full STRENGTH: Hip abductor weakness GAIT:antalgic  ASSESSMENT: Left hip bursitis with gluteal tendon tear  Plan Left hip bursectomy with gluteal tendon repair. Discussed procedure, risks and potential complications with patient who elects to proceed  Victoria Moran. Victoria Harbaugh, MD    03/17/2015, 9:34 PM

## 2015-03-18 ENCOUNTER — Ambulatory Visit (HOSPITAL_COMMUNITY): Payer: Medicare Other | Admitting: Anesthesiology

## 2015-03-18 ENCOUNTER — Encounter (HOSPITAL_COMMUNITY)
Admission: RE | Disposition: A | Discharge status: Home / Self Care | Payer: Self-pay | Source: Ambulatory Visit | Attending: Orthopedic Surgery

## 2015-03-18 ENCOUNTER — Observation Stay (HOSPITAL_COMMUNITY)
Admission: RE | Admit: 2015-03-18 | Discharge: 2015-03-19 | Disposition: A | Discharge status: Home / Self Care | Payer: Medicare Other | Source: Ambulatory Visit | Attending: Orthopedic Surgery | Admitting: Orthopedic Surgery

## 2015-03-18 ENCOUNTER — Encounter (HOSPITAL_COMMUNITY): Payer: Self-pay | Admitting: *Deleted

## 2015-03-18 DIAGNOSIS — Z7952 Long term (current) use of systemic steroids: Secondary | ICD-10-CM | POA: Diagnosis not present

## 2015-03-18 DIAGNOSIS — Z79899 Other long term (current) drug therapy: Secondary | ICD-10-CM | POA: Insufficient documentation

## 2015-03-18 DIAGNOSIS — Z853 Personal history of malignant neoplasm of breast: Secondary | ICD-10-CM | POA: Insufficient documentation

## 2015-03-18 DIAGNOSIS — M7072 Other bursitis of hip, left hip: Secondary | ICD-10-CM | POA: Diagnosis not present

## 2015-03-18 DIAGNOSIS — F419 Anxiety disorder, unspecified: Secondary | ICD-10-CM | POA: Diagnosis not present

## 2015-03-18 DIAGNOSIS — M67952 Unspecified disorder of synovium and tendon, left thigh: Secondary | ICD-10-CM

## 2015-03-18 DIAGNOSIS — M1990 Unspecified osteoarthritis, unspecified site: Secondary | ICD-10-CM | POA: Diagnosis not present

## 2015-03-18 DIAGNOSIS — Z9013 Acquired absence of bilateral breasts and nipples: Secondary | ICD-10-CM | POA: Insufficient documentation

## 2015-03-18 DIAGNOSIS — S76012A Strain of muscle, fascia and tendon of left hip, initial encounter: Secondary | ICD-10-CM | POA: Diagnosis not present

## 2015-03-18 DIAGNOSIS — H3530 Unspecified macular degeneration: Secondary | ICD-10-CM | POA: Insufficient documentation

## 2015-03-18 DIAGNOSIS — M7602 Gluteal tendinitis, left hip: Secondary | ICD-10-CM | POA: Insufficient documentation

## 2015-03-18 DIAGNOSIS — I10 Essential (primary) hypertension: Secondary | ICD-10-CM | POA: Insufficient documentation

## 2015-03-18 DIAGNOSIS — M8580 Other specified disorders of bone density and structure, unspecified site: Secondary | ICD-10-CM | POA: Insufficient documentation

## 2015-03-18 DIAGNOSIS — M7600 Gluteal tendinitis, unspecified hip: Secondary | ICD-10-CM | POA: Diagnosis present

## 2015-03-18 DIAGNOSIS — X58XXXA Exposure to other specified factors, initial encounter: Secondary | ICD-10-CM | POA: Insufficient documentation

## 2015-03-18 HISTORY — PX: EXCISION/RELEASE BURSA HIP: SHX5014

## 2015-03-18 SURGERY — RELEASE, BURSA, TROCHANTERIC
Anesthesia: Spinal | Site: Hip | Laterality: Left

## 2015-03-18 MED ORDER — ONDANSETRON HCL 4 MG PO TABS: 4.0000 mg | ORAL_TABLET | Freq: Four times a day (QID) | ORAL | Status: DC | PRN

## 2015-03-18 MED ORDER — CEFAZOLIN SODIUM-DEXTROSE 2-3 GM-% IV SOLR
INTRAVENOUS | Status: AC
  Filled 2015-03-18: qty 50

## 2015-03-18 MED ORDER — METOCLOPRAMIDE HCL 5 MG PO TABS
5.0000 mg | ORAL_TABLET | Freq: Three times a day (TID) | ORAL | Status: DC | PRN
  Filled 2015-03-18: qty 2

## 2015-03-18 MED ORDER — MIDAZOLAM HCL 5 MG/5ML IJ SOLN
INTRAMUSCULAR | Status: DC | PRN
  Administered 2015-03-18: 2 mg via INTRAVENOUS

## 2015-03-18 MED ORDER — CEFAZOLIN SODIUM-DEXTROSE 2-3 GM-% IV SOLR
2.0000 g | INTRAVENOUS | Status: AC
  Administered 2015-03-18: 2 g via INTRAVENOUS

## 2015-03-18 MED ORDER — ACETAMINOPHEN 10 MG/ML IV SOLN
1000.0000 mg | Freq: Once | INTRAVENOUS | Status: AC
  Administered 2015-03-18: 1000 mg via INTRAVENOUS
  Filled 2015-03-18: qty 100

## 2015-03-18 MED ORDER — PHENYLEPHRINE 40 MCG/ML (10ML) SYRINGE FOR IV PUSH (FOR BLOOD PRESSURE SUPPORT)
PREFILLED_SYRINGE | INTRAVENOUS | Status: AC
  Filled 2015-03-18: qty 10

## 2015-03-18 MED ORDER — DEXAMETHASONE SODIUM PHOSPHATE 10 MG/ML IJ SOLN
INTRAMUSCULAR | Status: AC
  Filled 2015-03-18: qty 1

## 2015-03-18 MED ORDER — ANASTROZOLE 1 MG PO TABS
1.0000 mg | ORAL_TABLET | Freq: Every morning | ORAL | Status: DC
  Administered 2015-03-19: 1 mg via ORAL
  Filled 2015-03-18: qty 1

## 2015-03-18 MED ORDER — SODIUM CHLORIDE 0.9 % IJ SOLN
INTRAMUSCULAR | Status: AC
  Filled 2015-03-18: qty 10

## 2015-03-18 MED ORDER — DOCUSATE SODIUM 100 MG PO CAPS
100.0000 mg | ORAL_CAPSULE | Freq: Every day | ORAL | Status: DC
  Administered 2015-03-19: 100 mg via ORAL

## 2015-03-18 MED ORDER — ACETAMINOPHEN 10 MG/ML IV SOLN
INTRAVENOUS | Status: AC
  Filled 2015-03-18: qty 100

## 2015-03-18 MED ORDER — OXYCODONE HCL 5 MG PO TABS: 5.0000 mg | ORAL_TABLET | ORAL | Status: DC | PRN

## 2015-03-18 MED ORDER — HYDROMORPHONE HCL 1 MG/ML IJ SOLN
INTRAMUSCULAR | Status: AC
  Filled 2015-03-18: qty 1

## 2015-03-18 MED ORDER — FENTANYL CITRATE (PF) 100 MCG/2ML IJ SOLN
INTRAMUSCULAR | Status: AC
  Filled 2015-03-18: qty 2

## 2015-03-18 MED ORDER — 0.9 % SODIUM CHLORIDE (POUR BTL) OPTIME
TOPICAL | Status: DC | PRN
  Administered 2015-03-18: 1000 mL

## 2015-03-18 MED ORDER — PREDNISONE 5 MG PO TABS
5.0000 mg | ORAL_TABLET | Freq: Every morning | ORAL | Status: DC
  Administered 2015-03-19: 5 mg via ORAL
  Filled 2015-03-18: qty 1

## 2015-03-18 MED ORDER — PROPOFOL 10 MG/ML IV BOLUS
INTRAVENOUS | Status: DC | PRN
  Administered 2015-03-18: 120 mg via INTRAVENOUS

## 2015-03-18 MED ORDER — ACETAMINOPHEN 500 MG PO TABS
1000.0000 mg | ORAL_TABLET | Freq: Four times a day (QID) | ORAL | Status: DC
  Administered 2015-03-18 – 2015-03-19 (×2): 1000 mg via ORAL
  Filled 2015-03-18 (×4): qty 2

## 2015-03-18 MED ORDER — AMLODIPINE BESYLATE 5 MG PO TABS
5.0000 mg | ORAL_TABLET | Freq: Every morning | ORAL | Status: DC
  Administered 2015-03-19: 5 mg via ORAL
  Filled 2015-03-18: qty 1

## 2015-03-18 MED ORDER — FUROSEMIDE 20 MG PO TABS
20.0000 mg | ORAL_TABLET | Freq: Every day | ORAL | Status: DC
  Administered 2015-03-19: 20 mg via ORAL
  Filled 2015-03-18: qty 1

## 2015-03-18 MED ORDER — BUPIVACAINE HCL (PF) 0.25 % IJ SOLN
INTRAMUSCULAR | Status: AC
  Filled 2015-03-18: qty 30

## 2015-03-18 MED ORDER — FENTANYL CITRATE (PF) 100 MCG/2ML IJ SOLN
INTRAMUSCULAR | Status: DC | PRN
  Administered 2015-03-18: 100 ug via INTRAVENOUS

## 2015-03-18 MED ORDER — DEXAMETHASONE SODIUM PHOSPHATE 10 MG/ML IJ SOLN
10.0000 mg | Freq: Once | INTRAMUSCULAR | Status: AC
  Administered 2015-03-18: 10 mg via INTRAVENOUS

## 2015-03-18 MED ORDER — BUPIVACAINE HCL 0.25 % IJ SOLN
INTRAMUSCULAR | Status: DC | PRN
  Administered 2015-03-18: 20 mL

## 2015-03-18 MED ORDER — SODIUM CHLORIDE 0.9 % IV SOLN: INTRAVENOUS | Status: DC

## 2015-03-18 MED ORDER — SODIUM CHLORIDE 0.9 % IJ SOLN
INTRAMUSCULAR | Status: DC | PRN
  Administered 2015-03-18: 30 mL

## 2015-03-18 MED ORDER — MORPHINE SULFATE (PF) 10 MG/ML IV SOLN: 1.0000 mg | INTRAVENOUS | Status: DC | PRN

## 2015-03-18 MED ORDER — ONDANSETRON HCL 4 MG/2ML IJ SOLN: 4.0000 mg | Freq: Four times a day (QID) | INTRAMUSCULAR | Status: DC | PRN

## 2015-03-18 MED ORDER — MORPHINE SULFATE (PF) 2 MG/ML IV SOLN: 1.0000 mg | INTRAVENOUS | Status: DC | PRN

## 2015-03-18 MED ORDER — METHOCARBAMOL 500 MG PO TABS
500.0000 mg | ORAL_TABLET | Freq: Four times a day (QID) | ORAL | Status: DC | PRN
  Administered 2015-03-19: 500 mg via ORAL
  Filled 2015-03-18: qty 1

## 2015-03-18 MED ORDER — HYDROMORPHONE HCL 1 MG/ML IJ SOLN
0.2500 mg | INTRAMUSCULAR | Status: DC | PRN
  Administered 2015-03-18 (×2): 0.5 mg via INTRAVENOUS

## 2015-03-18 MED ORDER — BUPIVACAINE LIPOSOME 1.3 % IJ SUSP
INTRAMUSCULAR | Status: DC | PRN
  Administered 2015-03-18: 20 mL

## 2015-03-18 MED ORDER — SODIUM CHLORIDE 0.9 % IJ SOLN
INTRAMUSCULAR | Status: AC
  Filled 2015-03-18: qty 50

## 2015-03-18 MED ORDER — METOPROLOL TARTRATE 50 MG PO TABS
75.0000 mg | ORAL_TABLET | Freq: Two times a day (BID) | ORAL | Status: DC
  Administered 2015-03-18 – 2015-03-19 (×2): 75 mg via ORAL
  Filled 2015-03-18 (×3): qty 1

## 2015-03-18 MED ORDER — LACTATED RINGERS IV SOLN
INTRAVENOUS | Status: DC
  Administered 2015-03-18: 1000 mL via INTRAVENOUS

## 2015-03-18 MED ORDER — CYCLOBENZAPRINE HCL 10 MG PO TABS
10.0000 mg | ORAL_TABLET | Freq: Every day | ORAL | Status: DC
  Administered 2015-03-18: 10 mg via ORAL
  Filled 2015-03-18 (×2): qty 1

## 2015-03-18 MED ORDER — METOCLOPRAMIDE HCL 5 MG/ML IJ SOLN: 5.0000 mg | Freq: Three times a day (TID) | INTRAMUSCULAR | Status: DC | PRN

## 2015-03-18 MED ORDER — PROPOFOL 10 MG/ML IV BOLUS
INTRAVENOUS | Status: AC
  Filled 2015-03-18: qty 40

## 2015-03-18 MED ORDER — TRAMADOL HCL 50 MG PO TABS: 50.0000 mg | ORAL_TABLET | Freq: Four times a day (QID) | ORAL | Status: DC | PRN

## 2015-03-18 MED ORDER — CLORAZEPATE DIPOTASSIUM 7.5 MG PO TABS
7.5000 mg | ORAL_TABLET | Freq: Every day | ORAL | Status: DC
  Administered 2015-03-18: 7.5 mg via ORAL
  Filled 2015-03-18: qty 1

## 2015-03-18 MED ORDER — CHLORHEXIDINE GLUCONATE 4 % EX LIQD: 60.0000 mL | Freq: Once | CUTANEOUS | Status: DC

## 2015-03-18 MED ORDER — CEFAZOLIN SODIUM-DEXTROSE 2-3 GM-% IV SOLR
2.0000 g | Freq: Four times a day (QID) | INTRAVENOUS | Status: AC
  Administered 2015-03-18 – 2015-03-19 (×3): 2 g via INTRAVENOUS
  Filled 2015-03-18 (×3): qty 50

## 2015-03-18 MED ORDER — BUPIVACAINE LIPOSOME 1.3 % IJ SUSP
20.0000 mL | Freq: Once | INTRAMUSCULAR | Status: DC
  Filled 2015-03-18: qty 20

## 2015-03-18 MED ORDER — MIDAZOLAM HCL 2 MG/2ML IJ SOLN
INTRAMUSCULAR | Status: AC
  Filled 2015-03-18: qty 2

## 2015-03-18 MED ORDER — SUCCINYLCHOLINE CHLORIDE 20 MG/ML IJ SOLN
INTRAMUSCULAR | Status: DC | PRN
  Administered 2015-03-18: 80 mg via INTRAVENOUS

## 2015-03-18 MED ORDER — PHENYLEPHRINE HCL 10 MG/ML IJ SOLN
INTRAMUSCULAR | Status: DC | PRN
  Administered 2015-03-18: 80 ug via INTRAVENOUS

## 2015-03-18 MED ORDER — ENOXAPARIN SODIUM 40 MG/0.4ML ~~LOC~~ SOLN
40.0000 mg | SUBCUTANEOUS | Status: DC
  Administered 2015-03-19: 40 mg via SUBCUTANEOUS
  Filled 2015-03-18 (×2): qty 0.4

## 2015-03-18 MED ORDER — LIDOCAINE HCL (CARDIAC) 20 MG/ML IV SOLN
INTRAVENOUS | Status: AC
  Filled 2015-03-18: qty 5

## 2015-03-18 MED ORDER — FENTANYL CITRATE (PF) 100 MCG/2ML IJ SOLN: 25.0000 ug | INTRAMUSCULAR | Status: DC | PRN

## 2015-03-18 MED ORDER — ONDANSETRON HCL 4 MG/2ML IJ SOLN
INTRAMUSCULAR | Status: DC | PRN
  Administered 2015-03-18: 4 mg via INTRAVENOUS

## 2015-03-18 MED ORDER — EPHEDRINE SULFATE 50 MG/ML IJ SOLN
INTRAMUSCULAR | Status: AC
  Filled 2015-03-18: qty 1

## 2015-03-18 MED ORDER — METHOCARBAMOL 1000 MG/10ML IJ SOLN
500.0000 mg | Freq: Four times a day (QID) | INTRAVENOUS | Status: DC | PRN
  Administered 2015-03-18: 500 mg via INTRAVENOUS
  Filled 2015-03-18 (×2): qty 5

## 2015-03-18 SURGICAL SUPPLY — 41 items
ANCHOR SUPER QUICK (Anchor) ×4 IMPLANT
BAG ZIPLOCK 12X15 (MISCELLANEOUS) IMPLANT
BIT DRILL 2.4X128 (BIT) IMPLANT
BLADE EXTENDED COATED 6.5IN (ELECTRODE) ×2 IMPLANT
CLOSURE STERI-STRIP 1/4X4 (GAUZE/BANDAGES/DRESSINGS) ×2 IMPLANT
DRAPE INCISE IOBAN 66X45 STRL (DRAPES) ×2 IMPLANT
DRAPE ORTHO SPLIT 77X108 STRL (DRAPES) ×2
DRAPE POUCH INSTRU U-SHP 10X18 (DRAPES) ×2 IMPLANT
DRAPE SURG ORHT 6 SPLT 77X108 (DRAPES) ×2 IMPLANT
DRAPE U-SHAPE 47X51 STRL (DRAPES) ×2 IMPLANT
DRSG ADAPTIC 3X8 NADH LF (GAUZE/BANDAGES/DRESSINGS) ×2 IMPLANT
DRSG MEPILEX BORDER 4X4 (GAUZE/BANDAGES/DRESSINGS) ×2 IMPLANT
DRSG MEPILEX BORDER 4X8 (GAUZE/BANDAGES/DRESSINGS) ×2 IMPLANT
DURAPREP 26ML APPLICATOR (WOUND CARE) ×2 IMPLANT
ELECT REM PT RETURN 9FT ADLT (ELECTROSURGICAL) ×2
ELECTRODE REM PT RTRN 9FT ADLT (ELECTROSURGICAL) ×1 IMPLANT
GAUZE SPONGE 4X4 12PLY STRL (GAUZE/BANDAGES/DRESSINGS) IMPLANT
GLOVE BIO SURGEON STRL SZ7.5 (GLOVE) ×2 IMPLANT
GLOVE BIO SURGEON STRL SZ8 (GLOVE) ×2 IMPLANT
GLOVE BIOGEL PI IND STRL 8 (GLOVE) ×2 IMPLANT
GLOVE BIOGEL PI INDICATOR 8 (GLOVE) ×2
GOWN STRL REUS W/TWL LRG LVL3 (GOWN DISPOSABLE) ×2 IMPLANT
GOWN STRL REUS W/TWL XL LVL3 (GOWN DISPOSABLE) ×2 IMPLANT
KIT BASIN OR (CUSTOM PROCEDURE TRAY) ×2 IMPLANT
MANIFOLD NEPTUNE II (INSTRUMENTS) ×2 IMPLANT
NDL SAFETY ECLIPSE 18X1.5 (NEEDLE) ×2 IMPLANT
NEEDLE HYPO 18GX1.5 SHARP (NEEDLE) ×2
NS IRRIG 1000ML POUR BTL (IV SOLUTION) ×2 IMPLANT
PACK TOTAL JOINT (CUSTOM PROCEDURE TRAY) ×2 IMPLANT
PASSER SUT SWANSON 36MM LOOP (INSTRUMENTS) IMPLANT
POSITIONER SURGICAL ARM (MISCELLANEOUS) ×2 IMPLANT
STRIP CLOSURE SKIN 1/2X4 (GAUZE/BANDAGES/DRESSINGS) IMPLANT
SUT ETHIBOND NAB CT1 #1 30IN (SUTURE) IMPLANT
SUT MNCRL AB 4-0 PS2 18 (SUTURE) ×2 IMPLANT
SUT VIC AB 1 CT1 27 (SUTURE) ×2
SUT VIC AB 1 CT1 27XBRD ANTBC (SUTURE) ×2 IMPLANT
SUT VIC AB 2-0 CT1 27 (SUTURE) ×2
SUT VIC AB 2-0 CT1 TAPERPNT 27 (SUTURE) ×2 IMPLANT
SYR 20CC LL (SYRINGE) ×2 IMPLANT
SYR 50ML LL SCALE MARK (SYRINGE) ×2 IMPLANT
TOWEL OR 17X26 10 PK STRL BLUE (TOWEL DISPOSABLE) ×2 IMPLANT

## 2015-03-18 NOTE — Interval H&P Note (Signed)
History and Physical Interval Note:  03/18/2015 4:20 PM  Victoria Moran  has presented today for surgery, with the diagnosis of right hip bursitis and gluteal tendon tear  The various methods of treatment have been discussed with the patient and family. After consideration of risks, benefits and other options for treatment, the patient has consented to  Procedure(s): [place] (Left) as a surgical intervention .  The patient's history has been reviewed, patient examined, no change in status, stable for surgery.  I have reviewed the patient's chart and labs.  Questions were answered to the patient's satisfaction.     Gearlean Alf

## 2015-03-18 NOTE — Transfer of Care (Signed)
Immediate Anesthesia Transfer of Care Note  Patient: Victoria Moran  Procedure(s) Performed: Procedure(s): LEFT HIP BURSECTOMY WITH GLUTEAL TENDON REPAIR (Left)  Patient Location: PACU  Anesthesia Type:General  Level of Consciousness: awake, alert , oriented and patient cooperative  Airway & Oxygen Therapy: Patient Spontanous Breathing and Patient connected to face mask oxygen  Post-op Assessment: Report given to RN, Post -op Vital signs reviewed and stable and Patient moving all extremities  Post vital signs: Reviewed and stable  Last Vitals:  Filed Vitals:   03/18/15 1257  BP: 173/60  Pulse: 69  Temp: 36.2 C  Resp: 16    Complications: No apparent anesthesia complications

## 2015-03-18 NOTE — Brief Op Note (Signed)
03/18/2015  5:11 PM  PATIENT:  Victoria Moran  80 y.o. female  PRE-OPERATIVE DIAGNOSIS:  Leftt hip bursitis and gluteal tendon tear  POST-OPERATIVE DIAGNOSIS:  Leftt hip bursitis and gluteal tendon tear  PROCEDURE:  Procedure(s): LEFT HIP BURSECTOMY WITH GLUTEAL TENDON REPAIR (Left)  SURGEON:  Surgeon(s) and Role:    * Gaynelle Arabian, MD - Primary  PHYSICIAN ASSISTANT:   ASSISTANTS: [name], PA-C   ANESTHESIA:   general  EBL:   minimal  BLOOD ADMINISTERED:none  DRAINS: (Medium) Hemovact drain(s) in the left hip with  Suction Open   LOCAL MEDICATIONS USED:  OTHER Exparel  COUNTS:  YES  TOURNIQUET:  * No tourniquets in log *  DICTATION: .Other Dictation: Dictation Number [id number]  PLAN OF CARE: Admit for overnight observation  PATIENT DISPOSITION:  PACU - hemodynamically stable.

## 2015-03-18 NOTE — Anesthesia Procedure Notes (Signed)
Procedure Name: Intubation Date/Time: 03/18/2015 4:45 PM Performed by: Noralyn Pick D Pre-anesthesia Checklist: Patient identified, Emergency Drugs available, Suction available and Patient being monitored Patient Re-evaluated:Patient Re-evaluated prior to inductionOxygen Delivery Method: Circle System Utilized Preoxygenation: Pre-oxygenation with 100% oxygen Intubation Type: IV induction Ventilation: Mask ventilation without difficulty Laryngoscope Size: Mac and 4 Grade View: Grade II Tube type: Oral Number of attempts: 1 Airway Equipment and Method: Stylet and Oral airway Placement Confirmation: ETT inserted through vocal cords under direct vision,  positive ETCO2 and breath sounds checked- equal and bilateral Secured at: 21 cm Tube secured with: Tape Dental Injury: Teeth and Oropharynx as per pre-operative assessment

## 2015-03-18 NOTE — Op Note (Signed)
NAMEBAILY, KNOPS             ACCOUNT NO.:  [id number]  MEDICAL RECORD NO.:  [id number]  LOCATION:  [facility]PO                         FACILITY:  [facility]  PHYSICIAN:  Gaynelle Arabian, M.D.    DATE OF BIRTH:  1935-11-16  DATE OF PROCEDURE:  03/18/2015 DATE OF DISCHARGE:                              OPERATIVE REPORT   PREOPERATIVE DIAGNOSIS:  Intractable left hip bursitis with gluteal tendon tear.  POSTOPERATIVE DIAGNOSIS:  Intractable left hip bursitis with gluteal tendon tear.  PROCEDURE:  Left hip bursectomy and gluteal tendon repair.  SURGEON:  Gaynelle Arabian, M.D.  ASSISTANT:  Alexzandrew L. Perkins, PA-C.  ANESTHESIA:  General.  ESTIMATED BLOOD LOSS:  Minimal.  DRAINS:  Hemovac x1.  COMPLICATIONS:  None.  CONDITION:  Stable to recovery.  BRIEF CLINICAL NOTE:  Ms. Whitinger is a 80 year old female, several- month history significant lateral left hip pain and limp.  She had an injection of corticosteroid, which provided temporary relief with the pain came back and the limp got worse.  She ended up having an MRI showing gluteal tendon tear.  She was referred to me for management.  I had previously fixed her right hip, which had the same problem.  She presents now for left hip bursectomy and gluteal tendon repair.  PROCEDURE IN DETAIL:  After successful administration of general anesthetic, the patient was placed in the right lateral decubitus position with the left side up and held with the hip positioner.  Her left lower extremity is isolated from her perineum with plastic drapes and prepped and draped in the usual sterile fashion.  The lateral based incision was then drawn to the center with tip of the greater trochanter about 4 inches in length.  Skin was cut with a 10 blade through subcutaneous tissue to the fascia lata, which was incised in line with the skin incision.  There was a large fluid-filled bursal sac medially underneath the fascia lata.  The bursa was  removed.  It was noted that she has a tear with retraction of the middle third of the gluteus medius tendon and also extends somewhat into the posterior third, but the posterior most fibers remain intact.  The anterior tendon is intact.  I then freshened up a trough on to the greater trochanter past two Mitek suture anchors into the trough.  The free sutures were then passed through the edge of the gluteal tendon and the tendon was advanced down into the trough and then oversewn with the sutures and the sutures were tied with a very stable repair.  I then injected the gluteal muscles, the fascia lata and the subcu tissues with a total of 20 mL of Exparel mixed with 30 mL of saline and then an additional 20 mL of 0.25% Marcaine was injected into the same tissues.  The fascia lata is closed running #1 Stratafix suture going from the tip of the trochanter proximally.  I left opened a triangular area of the tendon below this, so would not rub on the greater trochanter.  A Hemovac drain was then placed.  Subcu was closed in 2 layers with interrupted 2-0 Vicryl.  The subcuticular layer was closed with a running 4-0 Monocryl.  Incision was clean and dry, and Steri-Strips and a sterile dressing applied.  The patient was then awakened and transported to recovery in stable condition.     Gaynelle Arabian, M.D.     FA/MEDQ  D:  03/18/2015  T:  03/18/2015  Job:  [id number]

## 2015-03-18 NOTE — Anesthesia Preprocedure Evaluation (Addendum)
Anesthesia Evaluation  Patient identified by MRN, date of birth, ID band Patient awake  General Assessment Comment:Past Medical History Diagnosis Date . Arthritis  . Hypertension  . Osteopenia  . 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 Tendinopathy of left gluteus medius    hip . Insomnia  . Macular degeneration of both eyes  . Swelling of both ankles  . Cancer ([place])    hx bilateral breast cancer . Anxiety        Reviewed: Allergy & Precautions, NPO status , Patient's Chart, lab work & pertinent test results  Airway Mallampati: II  TM Distance: >3 FB Neck ROM: Full    Dental no notable dental hx.    Pulmonary neg pulmonary ROS,    Pulmonary exam normal breath sounds clear to auscultation       Cardiovascular hypertension, Pt. on medications and Pt. on home beta blockers Normal cardiovascular exam Rhythm:Regular Rate:Normal     Neuro/Psych Anxiety negative neurological ROS     GI/Hepatic negative GI ROS, Neg liver ROS,   Endo/Other  negative endocrine ROS  Renal/GU negative Renal ROS  negative genitourinary   Musculoskeletal  (+) Arthritis ,   Abdominal   Peds negative pediatric ROS (+)  Hematology negative hematology ROS (+)   Anesthesia Other Findings   Reproductive/Obstetrics negative OB ROS                             Anesthesia Physical Anesthesia Plan  ASA: II  Anesthesia Plan: Spinal   Post-op Pain Management:    Induction: Intravenous  Airway Management Planned: Natural Airway  Additional Equipment:   Intra-op Plan:   Post-operative Plan:   Informed Consent: I have reviewed the patients History and Physical, chart, labs and discussed the  procedure including the risks, benefits and alternatives for the proposed anesthesia with the patient or authorized representative who has indicated his/her understanding and acceptance.   Dental advisory given  Plan Discussed with: CRNA  Anesthesia Plan Comments: (Discussed risks and benefits of and differences between spinal and general. Discussed risks of spinal including headache, backache, failure, bleeding and hematoma, infection, and nerve damage. Patient consents to spinal. Questions answered. Platelet count normal. On no anticoagulants. She had a spinal for other hip in 2015.)       Anesthesia Quick Evaluation

## 2015-03-19 ENCOUNTER — Encounter (HOSPITAL_COMMUNITY): Payer: Self-pay | Admitting: Orthopedic Surgery

## 2015-03-19 DIAGNOSIS — M7072 Other bursitis of hip, left hip: Secondary | ICD-10-CM | POA: Diagnosis not present

## 2015-03-19 MED ORDER — TRAMADOL HCL 50 MG PO TABS: 50.0000 mg | ORAL_TABLET | Freq: Four times a day (QID) | ORAL | Status: DC | PRN

## 2015-03-19 MED ORDER — METHOCARBAMOL 500 MG PO TABS: 500.0000 mg | ORAL_TABLET | Freq: Four times a day (QID) | ORAL | Status: DC | PRN

## 2015-03-19 MED ORDER — OXYCODONE HCL 5 MG PO TABS: 5.0000 mg | ORAL_TABLET | ORAL | Status: DC | PRN

## 2015-03-19 NOTE — Discharge Summary (Signed)
Physician Discharge Summary   Patient ID: Victoria Moran MRN: [id number] DOB/AGE: April 24, 1935 80 y.o.  Admit date: 03/18/2015 Discharge date: 03-19-2015  Primary Diagnosis:  Intractable left hip bursitis with gluteal tendon tear. Admission Diagnoses:  Past Medical History  Diagnosis Date  . Arthritis   . Hypertension   . Osteopenia   . History of breast cancer oncologist--  [place] cancer caner--  no recurrence    dx 2009--  DCIS (cT1 N0 M0) s/p  right total mastectomy//  2014--  low grade DCIS  ---s/p left mastectomy//    no chemo or radiation  . Tendinopathy of left gluteus medius     hip  . Insomnia   . Macular degeneration of both eyes   . Swelling of both ankles   . Cancer ([place])     hx bilateral breast cancer  . Anxiety    Discharge Diagnoses:   Principal Problem:   Tendinopathy of left gluteus medius Active Problems:   Gluteal tendonitis  Estimated body mass index is 25.34 kg/(m^2) as calculated from the following:   Height as of this encounter: 5\' 3"  (1.6 m).   Weight as of this encounter: 64.864 kg (143 lb).  Procedure(s) (LRB): LEFT HIP BURSECTOMY WITH GLUTEAL TENDON REPAIR (Left)   Consults: None  HPI: Victoria Moran is a 80 year old female, several- month history significant lateral left hip pain and limp. She had an injection of corticosteroid, which provided temporary relief with the pain came back and the limp got worse. She ended up having an MRI showing gluteal tendon tear. She was referred to me for management. I had previously fixed her right hip, which had the same problem. She presents now for left hip bursectomy and gluteal tendon repair.  Laboratory Data: Hospital Outpatient Visit on 03/11/2015  Component Date Value Ref Range Status  . WBC 03/11/2015 12.0* 4.0 - 10.5 K/uL Final  . RBC 03/11/2015 3.86* 3.87 - 5.11 MIL/uL Final  . Hemoglobin 03/11/2015 12.8  12.0 - 15.0 g/dL Final  . HCT 03/11/2015 38.8  36.0 - 46.0 % Final  . MCV  03/11/2015 100.5* 78.0 - 100.0 fL Final  . MCH 03/11/2015 33.2  26.0 - 34.0 pg Final  . MCHC 03/11/2015 33.0  30.0 - 36.0 g/dL Final  . RDW 03/11/2015 13.6  11.5 - 15.5 % Final  . Platelets 03/11/2015 315  150 - 400 K/uL Final     X-Rays:No results found.  EKG: Orders placed or performed during the hospital encounter of 09/27/13  . EKG     Hospital Course: Patient was admitted to [facility] and taken to the OR and underwent the above state procedure without complications.  Patient tolerated the procedure well and was later transferred to the recovery room and then to the orthopaedic floor for postoperative care.  They were given PO and IV analgesics for pain control following their surgery.  They were given 24 hours of postoperative antibiotics of  Anti-infectives    Start     Dose/Rate Route Frequency Ordered Stop   03/19/15 0600  ceFAZolin (ANCEF) IVPB 2 g/50 mL premix     2 g 100 mL/hr over 30 Minutes Intravenous On call to O.R. 03/18/15 1304 03/19/15 0630   03/18/15 2300  ceFAZolin (ANCEF) IVPB 2 g/50 mL premix     2 g 100 mL/hr over 30 Minutes Intravenous Every 6 hours 03/18/15 1846 03/19/15 1659     and started on DVT prophylaxis in the form of Lovenox. Patient was encouraged  to get up and ambulate the day after surgery.  The patient was allowed to be WBAT with therapy. Discharge planning was consulted to help with postop disposition and equipment needs.  Patient had a good night on the evening of surgery.  They started to get up OOB with therapy on day one.   Patient was seen in rounds and was ready to go home following morning ambulation.  Diet - Cardiac diet Follow up - in two weeks. Call office for appointment at [phone]. Activity - Weight bearing as tolerated to the surgical leg.  No active abduction of the leg, no pulling leg out to the side away from the body.  Walker for first several days until comfortable ambulating. May start showering three days following  surgery but do not submerge incision under water. Continue to use ice for pain and swelling from surgery.  Baby Aspirin 81 mg daily for three weeks.  Please use walker for the first couple of days until comfortable ambulating.  May start changing dressing tomorrow with dry gauze and tape.  Disposition - Home Condition Upon Discharge - Good D/C Meds - See DC Summary DVT Prophylaxis - Aspirin 81 mg daily for three weeks.   Discharge Instructions    Call MD / Call 911    Complete by:  As directed   If you experience chest pain or shortness of breath, CALL 911 and be transported to the hospital emergency room.  If you develope a fever above 101 F, pus (white drainage) or increased drainage or redness at the wound, or calf pain, call your surgeon's office.     Change dressing    Complete by:  As directed   You may change your dressing dressing daily with sterile 4 x 4 inch gauze dressing and paper tape.  Do not submerge the incision under water.     Constipation Prevention    Complete by:  As directed   Drink plenty of fluids.  Prune juice may be helpful.  You may use a stool softener, such as Colace (over the counter) 100 mg twice a day.  Use MiraLax (over the counter) for constipation as needed.     Diet - low sodium heart healthy    Complete by:  As directed      Discharge instructions    Complete by:  As directed   Pick up stool softner and laxative for home use following surgery while on pain medications. Do not submerge incision under water. May remove the surgical dressing tomorrow, Friday 03/20/2015, and then apply a dry gauze dressing daily. Please use good hand washing techniques while changing dressing each day. May shower starting three days after surgery starting Saturday 03/21/2015. Please use a clean towel to pat the incision dry following showers. Continue to use ice for pain and swelling after surgery. Do not use any lotions or creams on the incision until instructed by your  surgeon.  Postoperative Constipation Protocol  Constipation - defined medically as fewer than three stools per week and severe constipation as less than one stool per week.  One of the most common issues patients have following surgery is constipation. Even if you have a regular bowel pattern at home, your normal regimen is likely to be disrupted due to multiple reasons following surgery. Combination of anesthesia, postoperative narcotics, change in appetite and fluid intake all can affect your bowels. In order to avoid complications following surgery, here are some recommendations in order to help you during your recovery  period.  Colace (docusate) - Pick up an over-the-counter form of Colace or another stool softener and take twice a day as long as you are requiring postoperative pain medications. Take with a full glass of water daily. If you experience loose stools or diarrhea, hold the colace until you stool forms back up. If your symptoms do not get better within 1 week or if they get worse, check with your doctor.  Dulcolax (bisacodyl) - Pick up over-the-counter and take as directed by the product packaging as needed to assist with the movement of your bowels. Take with a full glass of water. Use this product as needed if not relieved by Colace only.   MiraLax (polyethylene glycol) - Pick up over-the-counter to have on hand. MiraLax is a solution that will increase the amount of water in your bowels to assist with bowel movements. Take as directed and can mix with a glass of water, juice, soda, coffee, or tea. Take if you go more than two days without a movement. Do not use MiraLax more than once per day. Call your doctor if you are still constipated or irregular after using this medication for 7 days in a row.  If you continue to have problems with postoperative constipation, please contact the office for further assistance and recommendations. If you experience "the worst abdominal  pain ever" or develop nausea or vomiting, please contact the office immediatly for further recommendations for treatment. Follow up - in two weeks. Call office for appointment at [phone]. Activity - Weight bearing as tolerated to the surgical leg. No active abduction of the leg, (no pulling leg out to the side away from the body). Walker for first several days until comfortable ambulating. May start showering three days following surgery but do not submerge incision under water. Continue to use ice for pain and swelling from surgery.  Baby Aspirin 81 mg daily for three weeks.  Please use walker for the first couple of days until comfortable ambulating.  May start changing dressing tomorrow, Friday 03/20/2015, with dry gauze and tape.  DVT Prophylaxis - Aspirin 81 mg daily for three weeks.      Do not sit on low chairs, stoools or toilet seats, as it may be difficult to get up from low surfaces    Complete by:  As directed      Driving restrictions    Complete by:  As directed   No driving until released by the physician.     Increase activity slowly as tolerated    Complete by:  As directed   NO ABDUCTION - Do not pull the leg out away from the body to the side.     Lifting restrictions    Complete by:  As directed   No lifting until released by the physician.     Patient may shower    Complete by:  As directed   You may shower without a dressing once there is no drainage.  Do not wash over the wound.  If drainage remains, do not shower until drainage stops.     TED hose    Complete by:  As directed   Use stockings (TED hose) for 3 weeks on both leg(s).  You may remove them at night for sleeping.     Weight bearing as tolerated    Complete by:  As directed   Laterality:  left  Extremity:  Lower            Medication List  STOP taking these medications        nabumetone 750 MG tablet  Commonly known as:  RELAFEN      TAKE these medications        acetaminophen 500 MG  tablet  Commonly known as:  TYLENOL  Take 1,000 mg by mouth every 6 (six) hours as needed for mild pain.     amLODipine 5 MG tablet  Commonly known as:  NORVASC  Take 5 mg by mouth every morning.     anastrozole 1 MG tablet  Commonly known as:  ARIMIDEX  Take 1 mg by mouth every morning.     calcium carbonate 600 MG Tabs tablet  Commonly known as:  OS-CAL  Take 600 mg by mouth daily with breakfast.     cholecalciferol 1000 units tablet  Commonly known as:  VITAMIN D  Take 1,000 Units by mouth daily.     clorazepate 7.5 MG tablet  Commonly known as:  TRANXENE  Take 7.5 mg by mouth at bedtime.     cyclobenzaprine 10 MG tablet  Commonly known as:  FLEXERIL  Take 10 mg by mouth at bedtime.     docusate sodium 100 MG capsule  Commonly known as:  COLACE  Take 100 mg by mouth daily.     furosemide 20 MG tablet  Commonly known as:  LASIX  Take 20 mg by mouth daily.     GLUCOSAMINE-CHONDROIT-VIT C-MN PO  Take by mouth daily. Liquid supplement- patient takes 1 capful daily     MACULAR VITAMIN BENEFIT Tabs  Take 2 tablets by mouth 2 (two) times daily.     methocarbamol 500 MG tablet  Commonly known as:  ROBAXIN  Take 1 tablet (500 mg total) by mouth every 6 (six) hours as needed for muscle spasms.     metoprolol 50 MG tablet  Commonly known as:  LOPRESSOR  Take 75 mg by mouth 2 (two) times daily.     oxyCODONE 5 MG immediate release tablet  Commonly known as:  Oxy IR/ROXICODONE  Take 1-2 tablets (5-10 mg total) by mouth every 3 (three) hours as needed for moderate pain, severe pain or breakthrough pain.     predniSONE 5 MG tablet  Commonly known as:  DELTASONE  Take 5 mg by mouth every morning.     traMADol 50 MG tablet  Commonly known as:  ULTRAM  Take 1-2 tablets (50-100 mg total) by mouth every 6 (six) hours as needed (mild pain).           Follow-up Information    Follow up with Gearlean Alf, MD. Schedule an appointment as soon as possible for a visit on  03/31/2015.   Specialty:  Orthopedic Surgery   Why:  Call office at [phone] to setup appointment for Tuesday 03/31/2015 with Dr. Wynelle Link.   Contact information:   [address] [place] [address] [id number]       Signed: [name], PA-C Orthopaedic Surgery 03/19/2015, 9:18 AM

## 2015-03-19 NOTE — Evaluation (Signed)
Physical Therapy Evaluation Patient Details Name: Victoria Moran MRN: [id number] DOB: January 05, 1936 Today's Date: 03/19/2015   History of Present Illness  L bursectomy and repair of gluteal tendon on 03/18/15  Clinical Impression  Patient is progressing well. Plans DC today.    Follow Up Recommendations No PT follow up    Equipment Recommendations  None recommended by PT    Recommendations for Other Services       Precautions / Restrictions Precautions Precautions: Fall Precaution Comments: no L abduction per patient and  surmise from surgical procedure      Mobility  Bed Mobility Overal bed mobility: Needs Assistance Bed Mobility: Supine to Sit     Supine to sit: Min assist     General bed mobility comments: assist with left leg  Transfers Overall transfer level: Needs assistance Equipment used: Rolling walker (2 wheeled) Transfers: Sit to/from Stand Sit to Stand: Supervision         General transfer comment: cues for hand placement  Ambulation/Gait Ambulation/Gait assistance: Min guard Ambulation Distance (Feet): 80 Feet Assistive device: Rolling walker (2 wheeled) Gait Pattern/deviations: Step-through pattern     General Gait Details: cues for position inside of the RW, noted bilateral trendelenburg gait.  Stairs            Wheelchair Mobility    Modified Rankin (Stroke Patients Only)       Balance                                             Pertinent Vitals/Pain Pain Score: 3  Pain Location: L hip Pain Descriptors / Indicators: Sore Pain Intervention(s): Monitored during session;Premedicated before session;Ice applied    Home Living Family/patient expects to be discharged to:: Private residence Living Arrangements: Spouse/significant other Available Help at Discharge: Family Type of Home: House Home Access: Stairs to enter Entrance Stairs-Rails: None Entrance Stairs-Number of Steps: 2 Home Layout: One  level Home Equipment: Environmental consultant - 2 wheels;Cane - single point;Bedside commode      Prior Function                 Hand Dominance        Extremity/Trunk Assessment   Upper Extremity Assessment: Overall WFL for tasks assessed             RLE Deficits / Details: noted trendelenburg during stance LLE Deficits / Details: same as right  Cervical / Trunk Assessment: Normal  Communication      Cognition Arousal/Alertness: Awake/alert Behavior During Therapy: WFL for tasks assessed/performed Overall Cognitive Status: Within Functional Limits for tasks assessed                      General Comments      Exercises        Assessment/Plan    PT Assessment Patent does not need any further PT services  PT Diagnosis Difficulty walking   PT Problem List    PT Treatment Interventions     PT Goals (Current goals can be found in the Care Plan section) Acute Rehab PT Goals Patient Stated Goal: to go home PT Goal Formulation: All assessment and education complete, DC therapy    Frequency     Barriers to discharge        Co-evaluation               End  of Session   Activity Tolerance: Patient tolerated treatment well Patient left: in chair;with call bell/phone within reach Nurse Communication: Mobility status    Functional Assessment Tool Used: clinical judgement Functional Limitation: Mobility: Walking and moving around Mobility: Walking and Moving Around Current Status [id number]): At least 1 percent but less than 20 percent impaired, limited or restricted Mobility: Walking and Moving Around Goal Status [phone]): At least 1 percent but less than 20 percent impaired, limited or restricted Mobility: Walking and Moving Around Discharge Status [phone]): At least 1 percent but less than 20 percent impaired, limited or restricted    Time: [id number] PT Time Calculation (min) (ACUTE ONLY): 25 min   Charges:   PT Evaluation $PT Eval Low Complexity: 1  Procedure PT Treatments $Gait Training: 8-22 mins   PT G Codes:   PT G-Codes **NOT FOR INPATIENT CLASS** Functional Assessment Tool Used: clinical judgement Functional Limitation: Mobility: Walking and moving around Mobility: Walking and Moving Around Current Status [id number]): At least 1 percent but less than 20 percent impaired, limited or restricted Mobility: Walking and Moving Around Goal Status [phone]): At least 1 percent but less than 20 percent impaired, limited or restricted Mobility: Walking and Moving Around Discharge Status [phone]): At least 1 percent but less than 20 percent impaired, limited or restricted    Claretha Cooper 03/19/2015, 4:29 PM

## 2015-03-19 NOTE — Anesthesia Postprocedure Evaluation (Signed)
Anesthesia Post Note  Patient: Victoria Moran  Procedure(s) Performed: Procedure(s) (LRB): LEFT HIP BURSECTOMY WITH GLUTEAL TENDON REPAIR (Left)  Patient location during evaluation: PACU Anesthesia Type: General Level of consciousness: awake and alert Pain management: pain level controlled Vital Signs Assessment: post-procedure vital signs reviewed and stable Respiratory status: spontaneous breathing, nonlabored ventilation, respiratory function stable and patient connected to nasal cannula oxygen Cardiovascular status: blood pressure returned to baseline and stable Postop Assessment: no signs of nausea or vomiting Anesthetic complications: no    Last Vitals:  Filed Vitals:   03/18/15 2105 03/19/15 0148  BP: 156/60 147/54  Pulse: 80 73  Temp: 36.5 C 36.7 C  Resp: 16 15    Last Pain:  Filed Vitals:   03/19/15 0216  PainSc: Asleep                 Nandana Krolikowski J

## 2015-03-19 NOTE — Care Management Note (Signed)
Case Management Note  Patient Details  Name: PRIANKA SUTHARD MRN: [id number] Date of Birth: [birth year]  Subjective/Objective:  80 y.o. F admitted 03/18/2015 for L Hip Bursectomy.  Anticipate discharge home today.  Action/Plan: Will sign Off. No further CM needs but will be available should additional discharge needs arise.                    Expected Discharge Date:                  Expected Discharge Plan:     In-House Referral:     Discharge planning Services  CM Consult  Post Acute Care Choice:    Choice offered to:     DME Arranged:    DME Agency:     HH Arranged:    [place] Agency:     Status of Service:  Completed, signed off  Medicare Important Message Given:    Date Medicare IM Given:    Medicare IM give by:    Date Additional Medicare IM Given:    Additional Medicare Important Message give by:     If discussed at [place] of Stay Meetings, dates discussed:    Additional Comments:  Delrae Sawyers, RN 03/19/2015, 9:12 AM

## 2015-03-19 NOTE — Progress Notes (Signed)
RN reviewed discharge instructions with patient and family. All questions answered.   Paperwork and prescriptions given.   NT rolled patient down in wheelchair with all belongings to family car.    

## 2015-03-19 NOTE — Discharge Instructions (Signed)
Dr. Gaynelle Arabian [facility] [address] [place], [place] [address] [phone]  Bursectomy / Gluteal Tendon Repair Discharge Instructions  HOME CARE INSTRUCTIONS  Remove items at home which could result in a fall. This includes throw rugs or furniture in walking pathways.   ICE to the affected hip every three hours for 30 minutes at a time and then as needed for pain and swelling.  Continue to use ice on the hip for pain and swelling from surgery. You may notice swelling that will progress down to the foot and ankle.  This is normal after surgery.  Elevate the leg when you are not up walking on it.    Continue to use the breathing machine which will help keep your temperature down.  It is common for your temperature to cycle up and down following surgery, especially at night when you are not up moving around and exerting yourself.  The breathing machine keeps your lungs expanded and your temperature down. Sit on high chairs which makes it easier to stand.  Sit on chairs with arms. Use the chair arms to help push yourself up when arising.   No Active Abduction of the leg (No pulling leg out to the side away from the body).  Use your walker for first several days until comfortable ambulating.  DIET You may resume your previous home diet once your are discharged from the hospital.  DRESSING / WOUND CARE / SHOWERING You may shower 3 days after surgery, but keep the wounds dry during showering.  You may use an occlusive plastic wrap (Press'n Seal for example), NO SOAKING/SUBMERGING IN THE BATHTUB.  If the bandage gets wet, change with a clean dry gauze.  If the incision gets wet, pat the wound dry with a clean towel. You may start showering three days following surgery but do not submerge the incision under water.Just pat the incision dry and apply a dry gauze dressing on daily. Change the surgical dressing daily and reapply a dry  dressing each time.  ACTIVITY Walk with your walker as instructed. Use walker as long as suggested by your caregivers. Avoid periods of inactivity such as sitting longer than an hour when not asleep. This helps prevent blood clots.  You may resume a sexual relationship in one month or when given the OK by your doctor.  You may return to work once you are cleared by your doctor.  Do not drive a car for 6 weeks or until released by you surgeon.  Do not drive while taking narcotics.  WEIGHT BEARING Weight bearing as tolerated with assist device (walker, cane, etc) as directed, use it as long as suggested by your surgeon or therapist, typically at least 4-6 weeks.  POSTOPERATIVE CONSTIPATION PROTOCOL Constipation - defined medically as fewer than three stools per week and severe constipation as less than one stool per week.  One of the most common issues patients have following surgery is constipation.  Even if you have a regular bowel pattern at home, your normal regimen is likely to be disrupted due to multiple reasons following surgery.  Combination of anesthesia, postoperative narcotics, change in appetite and fluid intake all can affect your bowels.  In order to avoid complications following surgery, here are some recommendations in order to help you during your recovery period.  Colace (docusate) - Pick up an over-the-counter form of Colace or another stool softener and take twice a day as long as you are requiring  postoperative pain medications.  Take with a full glass of water daily.  If you experience loose stools or diarrhea, hold the colace until you stool forms back up.  If your symptoms do not get better within 1 week or if they get worse, check with your doctor.  Dulcolax (bisacodyl) - Pick up over-the-counter and take as directed by the product packaging as needed to assist with the movement of your bowels.  Take with a full glass of water.  Use this product as needed if not relieved  by Colace only.   MiraLax (polyethylene glycol) - Pick up over-the-counter to have on hand.  MiraLax is a solution that will increase the amount of water in your bowels to assist with bowel movements.  Take as directed and can mix with a glass of water, juice, soda, coffee, or tea.  Take if you go more than two days without a movement. Do not use MiraLax more than once per day. Call your doctor if you are still constipated or irregular after using this medication for 7 days in a row.  If you continue to have problems with postoperative constipation, please contact the office for further assistance and recommendations.  If you experience "the worst abdominal pain ever" or develop nausea or vomiting, please contact the office immediatly for further recommendations for treatment.  ITCHING  If you experience itching with your medications, try taking only a single pain pill, or even half a pain pill at a time.  You can also use Benadryl over the counter for itching or also to help with sleep.   TED HOSE STOCKINGS Wear the elastic stockings on both legs for three weeks following surgery during the day but you may remove then at night for sleeping.  MEDICATIONS See your medication summary on the After Visit Summary that the nursing staff will review with you prior to discharge.  You may have some home medications which will be placed on hold until you complete the course of blood thinner medication.  It is important for you to complete the blood thinner medication as prescribed by your surgeon.  Continue your approved medications as instructed at time of discharge.  PRECAUTIONS If you experience chest pain or shortness of breath - call 911 immediately for transfer to the hospital emergency department.  If you develop a fever greater that 101 F, purulent drainage from wound, increased redness or drainage from wound, foul odor from the wound/dressing, or calf pain - CONTACT YOUR SURGEON.                                                    FOLLOW-UP APPOINTMENTS Make sure you keep all of your appointments after your operation with your surgeon and caregivers. You should call the office at the above phone number and make an appointment for approximately two weeks after the date of your surgery or on the date instructed by your surgeon outlined in the "After Visit Summary".   Pick up stool softner and laxative for home use following surgery while on pain medications. Do not submerge incision under water. Please use good hand washing techniques while changing dressing each day. May shower starting three days after surgery. Please use a clean towel to pat the incision dry following showers. Continue to use ice for pain and swelling after surgery. Do not use  any lotions or creams on the incision until instructed by your surgeon.  Diet - Cardiac diet Follow up - in two weeks on Tuesday 03/31/2015. Call office for appointment at [phone]. Activity - Weight bearing as tolerated to the surgical leg. No active abduction of the leg, (no pulling leg out to the side away from the body). Walker for first several days until comfortable ambulating. May start showering three days following surgery but do not submerge incision under water. Continue to use ice for pain and swelling from surgery.  Baby Aspirin 81 mg daily for three weeks.  Please use walker for the first couple of days until comfortable ambulating.  May start changing dressing tomorrow, Friday 03/20/2015, with dry gauze and tape.  Disposition - Home  DVT Prophylaxis - Aspirin 81 mg daily for three weeks.

## 2015-03-19 NOTE — Progress Notes (Signed)
   Subjective: 1 Day Post-Op Procedure(s) (LRB): LEFT HIP BURSECTOMY WITH GLUTEAL TENDON REPAIR (Left) Patient reports pain as mild.   Patient seen in rounds for Dr. Wynelle Link. Patient is well, and has had no acute complaints or problems except for no sleep. Patient is ready to go home today after therapy.  Objective: Vital signs in last 24 hours: Temp:  [97.2 F (36.2 C)-98 F (36.7 C)] 97.5 F (36.4 C) (01/05 0527) Pulse Rate:  [65-80] 69 (01/05 0527) Resp:  [14-26] 19 (01/05 0527) BP: (127-181)/(48-69) 127/48 mmHg (01/05 0527) SpO2:  [95 %-100 %] 95 % (01/05 0527) Weight:  [64.864 kg (143 lb)] 64.864 kg (143 lb) (01/04 1334)  Intake/Output from previous day:  Intake/Output Summary (Last 24 hours) at 03/19/15 0728 Last data filed at 03/19/15 0527  Gross per 24 hour  Intake 1811.25 ml  Output    475 ml  Net 1336.25 ml    Intake/Output this shift: UOP about 200 since MN recorded  EXAM: General - Patient is Alert, Appropriate and Oriented Extremity - Neurovascular intact Sensation intact distally Dorsiflexion/Plantar flexion intact Dressing - clean, dry, no drainage Motor Function - intact, moving foot and toes well on exam.  Hemovac pulled without difficulty.  Assessment/Plan: 1 Day Post-Op Procedure(s) (LRB): LEFT HIP BURSECTOMY WITH GLUTEAL TENDON REPAIR (Left) Procedure(s) (LRB): LEFT HIP BURSECTOMY WITH GLUTEAL TENDON REPAIR (Left) Past Medical History  Diagnosis Date  . Arthritis   . Hypertension   . Osteopenia   . 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 Tendinopathy of left gluteus medius     hip  . Insomnia   . Macular degeneration of both eyes   . Swelling of both ankles   . Cancer ([place])     hx bilateral breast cancer  . Anxiety    Principal Problem:   Tendinopathy of left  gluteus medius Active Problems:   Gluteal tendonitis  Estimated body mass index is 25.34 kg/(m^2) as calculated from the following:   Height as of this encounter: 5\' 3"  (1.6 m).   Weight as of this encounter: 64.864 kg (143 lb). Advance diet Up with therapy Discharge home with home health Diet - Cardiac diet Follow up - in two weeks. Call office for appointment at [phone]. Activity - Weight bearing as tolerated to the surgical leg.  No active abduction of the leg, (no pulling leg out to the side away from the body).  Walker for first several days until comfortable ambulating. May start showering three days following surgery but do not submerge incision under water. Continue to use ice for pain and swelling from surgery.  Baby Aspirin 81 mg daily for three weeks.  Please use walker for the first couple of days until comfortable ambulating.  May start changing dressing tomorrow, Friday 03/20/2015, with dry gauze and tape.  Disposition - Home Condition Upon Discharge - Good D/C Meds - See DC Summary DVT Prophylaxis - Aspirin 81 mg daily for three weeks.  [name], PA-C Orthopaedic Surgery 03/19/2015, 7:28 AM

## 2015-06-17 DIAGNOSIS — Z853 Personal history of malignant neoplasm of breast: Secondary | ICD-10-CM | POA: Diagnosis not present

## 2015-06-17 DIAGNOSIS — M859 Disorder of bone density and structure, unspecified: Secondary | ICD-10-CM | POA: Diagnosis not present

## 2015-08-25 ENCOUNTER — Encounter: Payer: Self-pay | Admitting: Sports Medicine

## 2015-08-25 ENCOUNTER — Ambulatory Visit (INDEPENDENT_AMBULATORY_CARE_PROVIDER_SITE_OTHER): Payer: Medicare Other | Admitting: Sports Medicine

## 2015-08-25 ENCOUNTER — Other Ambulatory Visit: Payer: Self-pay | Admitting: *Deleted

## 2015-08-25 VITALS — BP 148/54 | Ht 62.0 in | Wt 141.0 lb

## 2015-08-25 DIAGNOSIS — M25572 Pain in left ankle and joints of left foot: Secondary | ICD-10-CM

## 2015-08-25 DIAGNOSIS — M79672 Pain in left foot: Secondary | ICD-10-CM | POA: Diagnosis not present

## 2015-08-25 DIAGNOSIS — L03032 Cellulitis of left toe: Secondary | ICD-10-CM

## 2015-08-25 DIAGNOSIS — L0390 Cellulitis, unspecified: Secondary | ICD-10-CM | POA: Insufficient documentation

## 2015-08-25 DIAGNOSIS — M25579 Pain in unspecified ankle and joints of unspecified foot: Secondary | ICD-10-CM | POA: Insufficient documentation

## 2015-08-25 MED ORDER — CEPHALEXIN 500 MG PO CAPS: 500.0000 mg | ORAL_CAPSULE | Freq: Three times a day (TID) | ORAL | Status: DC

## 2015-08-25 NOTE — Patient Instructions (Signed)
Take the antibiotic for the next 2 weeks  We will call you with the results of the culture.  Keep a bandaid and ointment over the ulcer until it starts to heal up.  Soak once a day and take care to keep dry in between. Watch for signs of infection ( redness, warmth, fever, drainage) If you see any of these call your primary care physician or call us.  Will follow up in 2 weeks

## 2015-08-25 NOTE — Assessment & Plan Note (Signed)
Started on Keflex 500 3 times a day for the next 2 weeks I am concerned that the infected callus might spread to give her a deeper infection such as osteomyelitis  Daily soaks in soapy water Daily application of Band-Aids  Recheck in 2 weeks

## 2015-08-25 NOTE — Assessment & Plan Note (Signed)
I made a cushioned pad to take pressure off of the callus of her left foot  I think we need to make a cushioned special insert for the foot  However, I was concerned with infection today and would like to wait until this clears

## 2015-08-25 NOTE — Progress Notes (Signed)
Patient ID: Victoria Moran, female   DOB: [birth year], 80 y.o.   MRN: [id number]  Left foot pain chronic for years  Patient with history of a nonhealing fracture of the second left MTP joint Surgery to remove that joint led to an infection and very delayed healing in 2008  A year or so afterward she saw Dr. Beola Cord and had further surgery with some resection of the second metatarsal and removal of the second toe This was slow to heal Afterwards her pain was not relieved  She saw Dr. Rosana Hoes in [place] who did surgery and placed a metal plate She had a reaction to this so he later replace that with screws In any case after each of those surgeries she is come back to have pain in an area under her metatarsal arch with the second MTP joint would be   She has a hard callus there She was sent to Dr. Allyson Sabal because of a possible plantar wart They elected this and did not feel that it was a wart and shaved the callus down  They suggested she see me for persistent pain and perhaps some orthotics  This is more tender over the last few days and now has some bleeding and some drainage  Past history is reviewed and is somewhat complex She takes prednisone 5 mg daily for years because of arthritis She has a history of breast cancer with bilateral mastectomies She is on several additional medications as noted in the chart  Review of systems No fever or chills No redness around the foot but it does feel a little swollen Recent surgery to remove her bursa of the left hip by Dr. Maureen Ralphs Having trouble walking well because of the pain in the foot  Physical examination Older female in no acute distress BP 148/54 mmHg  Ht 5\' 2"  (1.575 m)  Wt 141 lb (63.957 kg)  BMI 25.78 kg/m2  Left foot shows marked deformity Valgus shift of the left great toe Absence of the second toe Several scars over the dorsum There is some varus shift of the third and fourth toe Subluxation of the fifth MTP  joint  Second to third area metatarsal callus on the plantar surface of the left foot This is tender and puffy and draining some purulent material

## 2015-08-28 LAB — WOUND CULTURE
Gram Stain: NONE SEEN
Gram Stain: NONE SEEN
Organism ID, Bacteria: NORMAL

## 2015-09-10 ENCOUNTER — Ambulatory Visit (INDEPENDENT_AMBULATORY_CARE_PROVIDER_SITE_OTHER): Payer: Medicare Other | Admitting: Sports Medicine

## 2015-09-10 ENCOUNTER — Encounter: Payer: Self-pay | Admitting: Sports Medicine

## 2015-09-10 VITALS — BP 144/60 | Ht 62.0 in | Wt 141.0 lb

## 2015-09-10 DIAGNOSIS — M25572 Pain in left ankle and joints of left foot: Secondary | ICD-10-CM

## 2015-09-10 NOTE — Assessment & Plan Note (Addendum)
As cushioned metatarsal pad helped 80%, will continue with this mechanism No further infection noted over callus New thicker cushioned pad added to new shoes Patient to call in 1 wk and let us know if this helped If it did, order cushioned special insert  I corrected the insoles and supervised all aspects of exam.  Ila Mcgill, MD

## 2015-09-10 NOTE — Progress Notes (Signed)
  Subjective:   Victoria Moran is a 80 y.o. female with a history of chronic left foot pain and callus formation here for L foot pain f/u.  Patient was last seen 08/25/15 for chronic left foot pain after multiple foot surgeries (see review in office note from 08/25/15).  At that time, callus on plantar side of 2nd to 3rd metatarsal area appeared to be infected.  Patient took entire 2 week course of Keflex and thinks that it is better.  She has not noticed any drainage or fevers or redness.    She reports metatarsal cushion has helped foot pain ~80%.  She is able to walk more than previously.  She bought new sneakers and brings them to clinic today.  Review of Systems:  Per HPI.   Social History: never smoker  Objective:  BP 144/60 mmHg  Ht 5\' 2"  (1.575 m)  Wt 141 lb (63.957 kg)  BMI 25.78 kg/m2  Gen:  80 y.o. female in NAD, elderly MSK: marked deformity of L foot with valgus shift of great toe, absence of second toe, varus shift of 3rd and 4th toes, several scars over dorsum. Callus on plantar surface of L 2nd to 3rd metarsal area without fluctuance or drainage or surrounding erythema.     Assessment & Plan:     Victoria Moran is a 80 y.o. female here for   Pain in joint, ankle and foot As cushioned metatarsal pad helped 80%, will continue with this mechanism No further infection noted over callus New thicker cushioned pad added to new shoes Patient to call in 1 wk and let us know if this helped If it did, order cushioned special insert    Virginia Crews, MD MPH PGY-2,  [facility] 09/10/2015  12:04 PM    She needs a special cushioned insole because post surgically 2nd MT is protruded through fat pad and will create callus and ulcer. Greater thickness of padded U pad today to unload pressure   Ultimately custom orthotics.  Ila Mcgill, MD

## 2015-10-21 ENCOUNTER — Encounter: Payer: Medicare Other | Admitting: Sports Medicine

## 2015-11-03 ENCOUNTER — Ambulatory Visit (INDEPENDENT_AMBULATORY_CARE_PROVIDER_SITE_OTHER): Payer: Medicare Other | Admitting: Sports Medicine

## 2015-11-03 ENCOUNTER — Encounter: Payer: Self-pay | Admitting: Sports Medicine

## 2015-11-03 DIAGNOSIS — M25572 Pain in left ankle and joints of left foot: Secondary | ICD-10-CM

## 2015-11-04 NOTE — Assessment & Plan Note (Signed)
She needs to continue in a custom orthotic  I hope the current pair we'll give her 3-5 years of use  Return if she is getting pain

## 2015-11-04 NOTE — Progress Notes (Signed)
Cc: left foot pain  Patient has a surgically altered left foot. This includes removal of the second toe. She was seen earlier and treated for an ulcer at the base of her second MTP joint Results for orders placed or performed in visit on 08/25/15  Wound culture  Result Value Ref Range   Gram Stain No WBC Seen    Gram Stain No Squamous Epithelial Cells Seen    Gram Stain Abundant Gram Positive Cocci In Pairs    Organism ID, Bacteria NORMAL SKIN FLORA    ulceration is associated with pressure from a second metatarsal head dropped through the metatarsal plate  I placed her in an over-the-counter orthotic with a special pad cannot to float the metatarsal heads of 2 and 3  The skills or significant pain relief  For that reason she is returning for completion of orthotics.   Examination Patient is in no acute distress today BP (!) 148/46   Ht 5\' 3"  (1.6 m)   Wt 141 lb (64 kg)   BMI 24.98 kg/m  See prior note for full description of foot evaluation  Patient was fitted for a : standard, cushioned, semi-rigid orthotic. The orthotic was heated and afterward the patient stood on the orthotic blank positioned on the orthotic stand. The patient was positioned in subtalar neutral position and 10 degrees of ankle dorsiflexion in a weight bearing stance. After completion of molding, a stable base was applied to the orthotic blank. The blank was ground to a stable position for weight bearing. Size: 7 red EVA/ super cell protection for forefoot Base: Blue EVA Posting: U Pad to float MT 2/3 Additional orthotic padding: none  I spent 45 minutes face-to-face to evaluate, but appear orthotics and counsel this patient in regard to preventing future foot ulcers.  Gait prior to preparation of custom orthotic but still somewhat painful. After completion orthotic she was able to walk with much less pain.

## 2016-03-25 DIAGNOSIS — J309 Allergic rhinitis, unspecified: Secondary | ICD-10-CM | POA: Diagnosis not present

## 2016-03-25 DIAGNOSIS — R252 Cramp and spasm: Secondary | ICD-10-CM | POA: Diagnosis not present

## 2016-03-25 DIAGNOSIS — I10 Essential (primary) hypertension: Secondary | ICD-10-CM | POA: Diagnosis not present

## 2016-03-25 DIAGNOSIS — M810 Age-related osteoporosis without current pathological fracture: Secondary | ICD-10-CM | POA: Diagnosis not present

## 2016-03-25 DIAGNOSIS — M159 Polyosteoarthritis, unspecified: Secondary | ICD-10-CM | POA: Diagnosis not present

## 2016-03-25 DIAGNOSIS — E7800 Pure hypercholesterolemia, unspecified: Secondary | ICD-10-CM | POA: Diagnosis not present

## 2016-03-25 DIAGNOSIS — K219 Gastro-esophageal reflux disease without esophagitis: Secondary | ICD-10-CM | POA: Diagnosis not present

## 2016-03-25 DIAGNOSIS — G4700 Insomnia, unspecified: Secondary | ICD-10-CM | POA: Diagnosis not present

## 2016-03-25 DIAGNOSIS — L299 Pruritus, unspecified: Secondary | ICD-10-CM | POA: Diagnosis not present

## 2016-03-25 DIAGNOSIS — B0229 Other postherpetic nervous system involvement: Secondary | ICD-10-CM | POA: Diagnosis not present

## 2016-03-25 DIAGNOSIS — N182 Chronic kidney disease, stage 2 (mild): Secondary | ICD-10-CM | POA: Diagnosis not present

## 2016-04-03 DIAGNOSIS — J0100 Acute maxillary sinusitis, unspecified: Secondary | ICD-10-CM | POA: Diagnosis not present

## 2016-04-05 DIAGNOSIS — K219 Gastro-esophageal reflux disease without esophagitis: Secondary | ICD-10-CM | POA: Diagnosis not present

## 2016-04-05 DIAGNOSIS — N182 Chronic kidney disease, stage 2 (mild): Secondary | ICD-10-CM | POA: Diagnosis not present

## 2016-04-05 DIAGNOSIS — L299 Pruritus, unspecified: Secondary | ICD-10-CM | POA: Diagnosis not present

## 2016-04-05 DIAGNOSIS — J309 Allergic rhinitis, unspecified: Secondary | ICD-10-CM | POA: Diagnosis not present

## 2016-04-05 DIAGNOSIS — R252 Cramp and spasm: Secondary | ICD-10-CM | POA: Diagnosis not present

## 2016-04-05 DIAGNOSIS — J0100 Acute maxillary sinusitis, unspecified: Secondary | ICD-10-CM | POA: Diagnosis not present

## 2016-04-05 DIAGNOSIS — M159 Polyosteoarthritis, unspecified: Secondary | ICD-10-CM | POA: Diagnosis not present

## 2016-04-05 DIAGNOSIS — M810 Age-related osteoporosis without current pathological fracture: Secondary | ICD-10-CM | POA: Diagnosis not present

## 2016-04-05 DIAGNOSIS — G4700 Insomnia, unspecified: Secondary | ICD-10-CM | POA: Diagnosis not present

## 2016-04-05 DIAGNOSIS — E7800 Pure hypercholesterolemia, unspecified: Secondary | ICD-10-CM | POA: Diagnosis not present

## 2016-04-05 DIAGNOSIS — B0229 Other postherpetic nervous system involvement: Secondary | ICD-10-CM | POA: Diagnosis not present

## 2016-04-05 DIAGNOSIS — I10 Essential (primary) hypertension: Secondary | ICD-10-CM | POA: Diagnosis not present

## 2016-04-11 DIAGNOSIS — H353131 Nonexudative age-related macular degeneration, bilateral, early dry stage: Secondary | ICD-10-CM | POA: Diagnosis not present

## 2016-04-11 DIAGNOSIS — H353211 Exudative age-related macular degeneration, right eye, with active choroidal neovascularization: Secondary | ICD-10-CM | POA: Diagnosis not present

## 2016-05-23 DIAGNOSIS — J309 Allergic rhinitis, unspecified: Secondary | ICD-10-CM | POA: Diagnosis not present

## 2016-05-23 DIAGNOSIS — R252 Cramp and spasm: Secondary | ICD-10-CM | POA: Diagnosis not present

## 2016-05-23 DIAGNOSIS — G4700 Insomnia, unspecified: Secondary | ICD-10-CM | POA: Diagnosis not present

## 2016-05-23 DIAGNOSIS — B0229 Other postherpetic nervous system involvement: Secondary | ICD-10-CM | POA: Diagnosis not present

## 2016-05-23 DIAGNOSIS — M159 Polyosteoarthritis, unspecified: Secondary | ICD-10-CM | POA: Diagnosis not present

## 2016-05-23 DIAGNOSIS — K219 Gastro-esophageal reflux disease without esophagitis: Secondary | ICD-10-CM | POA: Diagnosis not present

## 2016-05-23 DIAGNOSIS — L299 Pruritus, unspecified: Secondary | ICD-10-CM | POA: Diagnosis not present

## 2016-05-23 DIAGNOSIS — M810 Age-related osteoporosis without current pathological fracture: Secondary | ICD-10-CM | POA: Diagnosis not present

## 2016-05-23 DIAGNOSIS — E7800 Pure hypercholesterolemia, unspecified: Secondary | ICD-10-CM | POA: Diagnosis not present

## 2016-05-23 DIAGNOSIS — N182 Chronic kidney disease, stage 2 (mild): Secondary | ICD-10-CM | POA: Diagnosis not present

## 2016-05-23 DIAGNOSIS — I10 Essential (primary) hypertension: Secondary | ICD-10-CM | POA: Diagnosis not present

## 2016-05-23 DIAGNOSIS — R600 Localized edema: Secondary | ICD-10-CM | POA: Diagnosis not present

## 2016-06-06 DIAGNOSIS — H353131 Nonexudative age-related macular degeneration, bilateral, early dry stage: Secondary | ICD-10-CM | POA: Diagnosis not present

## 2016-06-06 DIAGNOSIS — H43812 Vitreous degeneration, left eye: Secondary | ICD-10-CM | POA: Diagnosis not present

## 2016-06-06 DIAGNOSIS — H35363 Drusen (degenerative) of macula, bilateral: Secondary | ICD-10-CM | POA: Diagnosis not present

## 2016-06-06 DIAGNOSIS — H353212 Exudative age-related macular degeneration, right eye, with inactive choroidal neovascularization: Secondary | ICD-10-CM | POA: Diagnosis not present

## 2016-06-23 DIAGNOSIS — Z853 Personal history of malignant neoplasm of breast: Secondary | ICD-10-CM | POA: Diagnosis not present

## 2016-06-23 DIAGNOSIS — M8580 Other specified disorders of bone density and structure, unspecified site: Secondary | ICD-10-CM | POA: Diagnosis not present

## 2016-06-23 DIAGNOSIS — D649 Anemia, unspecified: Secondary | ICD-10-CM | POA: Diagnosis not present

## 2016-07-19 ENCOUNTER — Ambulatory Visit (INDEPENDENT_AMBULATORY_CARE_PROVIDER_SITE_OTHER): Payer: PPO | Admitting: Sports Medicine

## 2016-07-19 DIAGNOSIS — M25572 Pain in left ankle and joints of left foot: Secondary | ICD-10-CM

## 2016-07-19 DIAGNOSIS — M2041 Other hammer toe(s) (acquired), right foot: Secondary | ICD-10-CM | POA: Diagnosis not present

## 2016-07-19 MED ORDER — CEPHALEXIN 500 MG PO CAPS: 500.0000 mg | ORAL_CAPSULE | Freq: Three times a day (TID) | ORAL | 0 refills | Status: DC

## 2016-07-19 NOTE — Progress Notes (Signed)
Subjective: CC: f/u L foot pain HPI: Patient is a 81 y.o. female with a past medical history of chronic L foot pain after multiple surgeries  presenting to clinic today for a follow up.  In the past (6/17), a callus on the plantar aspect of the 2nd to 3rd metatarsal area appeared to be infected and resolved with Keflex.   The patient was last seen 11/03/15. She states she was doing much better until a few weeks ago. She stopped wearing her custom orthotics and started wearing shoes she got from [employer] and bedroom shoes. She notes significant pain with weight bearing on that location. She has been doing warm soapy water baths daily. No fevers, erythema, or drainage at the site. No other injuries.   Additionally, she notes significant pain over the dorsal aspect of her 4th toe on the R foot. She stated there was a callus there due to malformation, but she pulled it off. Pain is only with wearing shoes.    She denies a h/o diabetes. Surgeries in past [facility]?, [facility] in [place]   Social History: never smoker   ROS: All other systems reviewed and are negative.  Past Medical History Patient Active Problem List   Diagnosis Date Noted  . Hammer toe of right foot 07/19/2016  . Pain in joint, ankle and foot 08/25/2015  . Gluteal tendonitis 03/18/2015  . Tendinopathy of left gluteus medius 03/17/2015  . Tear of right gluteus minimus tendon 09/27/2013  . Rupture of gluteus minimus tendon 09/27/2013  . Tick bite of back 08/13/2012  . History of breast cancer in female 06/26/2012  . Cancer of upper-outer quadrant of LEFT female breast 06/26/2012    Medications- reviewed and updated Current Outpatient Prescriptions  Medication Sig Dispense Refill  . acetaminophen (TYLENOL) 500 MG tablet Take 1,000 mg by mouth every 6 (six) hours as needed for mild pain.    [name] acyclovir (ZOVIRAX) 800 MG tablet TAKE ONE TABLET BY MOUTH FIVE TIMES DAILY FOR 10 DAYS  0  . amLODipine (NORVASC) 5 MG  tablet Take 5 mg by mouth every morning.     [name] anastrozole (ARIMIDEX) 1 MG tablet Take 1 mg by mouth every morning.     . calcium carbonate (OS-CAL) 600 MG TABS Take 600 mg by mouth daily with breakfast.     . cephALEXin (KEFLEX) 500 MG capsule Take 1 capsule (500 mg total) by mouth 3 (three) times daily. 30 capsule 0  . cholecalciferol (VITAMIN D) 1000 UNITS tablet Take 1,000 Units by mouth daily.    . clonazePAM (KLONOPIN) 1 MG tablet Take 1 mg by mouth every 8 (eight) hours as needed.  0  . clorazepate (TRANXENE) 7.5 MG tablet Take 7.5 mg by mouth at bedtime.     . cyclobenzaprine (FLEXERIL) 10 MG tablet Take 10 mg by mouth at bedtime.     . docusate sodium (COLACE) 100 MG capsule Take 100 mg by mouth daily.     . furosemide (LASIX) 20 MG tablet Take 20 mg by mouth daily.  0  . gabapentin (NEURONTIN) 400 MG capsule Take 400 mg by mouth at bedtime.  5  . GLUCOSAMINE-CHONDROIT-VIT C-MN PO Take by mouth daily. Liquid supplement- patient takes 1 capful daily    . methocarbamol (ROBAXIN) 500 MG tablet Take 1 tablet (500 mg total) by mouth every 6 (six) hours as needed for muscle spasms. 80 tablet 0  . metoprolol (LOPRESSOR) 50 MG tablet Take 75 mg by mouth 2 (two)  times daily.     . Multiple Vitamins-Minerals (MACULAR VITAMIN BENEFIT) TABS Take 2 tablets by mouth 2 (two) times daily.    . nabumetone (RELAFEN) 750 MG tablet   10  . oxyCODONE (OXY IR/ROXICODONE) 5 MG immediate release tablet Take 1-2 tablets (5-10 mg total) by mouth every 3 (three) hours as needed for moderate pain, severe pain or breakthrough pain. 80 tablet 0  . predniSONE (DELTASONE) 10 MG tablet   1  . predniSONE (DELTASONE) 5 MG tablet Take 5 mg by mouth every morning.     . traMADol (ULTRAM) 50 MG tablet Take 1-2 tablets (50-100 mg total) by mouth every 6 (six) hours as needed (mild pain). 80 tablet 1   No current facility-administered medications for this visit.     Objective: Office vital signs reviewed. BP (!) 176/69    Ht 5\' 2"  (1.575 m)   Wt 139 lb (63 kg)   BMI 25.42 kg/m    Physical Examination:  General: Awake, alert, well- nourished, NAD  Marked deformity of L foot with valgus shift of great toe, absence of second toe, varus shift of 3rd and 4th toes, several scars over dorsum. Subluxation of 5th MTP.  Callus on plantar surface of L 2nd to 3rd metarsal area which is soft but no definite fluctuance. No surrounding erythema, warmth or drainage.   Right foot with hammer toe deformities of 2nd through 5th digits. Callus formation over dorsal aspect of 4th toe.   Assessment/Plan: Pain in joint, ankle and foot Most likely due to abnormality in weight distribution and not wearing custom orthotic shoes regularly. Concern for possible infection given the softness of the callus. Patient is not a diabetic but given her history of multiple surgeries and infection in the past, she's high risk. No systemic symptoms currently. - Keflex 500mg  TID x 10 days - continue daily warm/soapy water soaks - return precautions discussed - added additional support to metatarsal U pad - f/u in 2 weeks - ultimately, pt may require evaluation by foot surgery in the future  Hammer toe of right foot Pt presenting with hammer toes on the R with callus formation on the dorsal aspect of the 4th toe.  - metatarsal pad and hammertoe pad placed in shoe.     No orders of the defined types were placed in this encounter.   Meds ordered this encounter  Medications  . cephALEXin (KEFLEX) 500 MG capsule    Sig: Take 1 capsule (500 mg total) by mouth 3 (three) times daily.    Dispense:  30 capsule    Refill:  [facility] PGY-3, [place]

## 2016-07-19 NOTE — Assessment & Plan Note (Addendum)
Most likely due to abnormality in weight distribution and not wearing custom orthotic shoes regularly. Concern for possible infection given the softness of the callus. Patient is not a diabetic but given her history of multiple surgeries and infection in the past, she's high risk. No systemic symptoms currently. - Keflex 500mg  TID x 10 days - continue daily warm/soapy water soaks - return precautions discussed - added additional support to metatarsal U pad - f/u in 2 weeks - ultimately, pt may require evaluation by foot surgery in the future

## 2016-07-19 NOTE — Assessment & Plan Note (Signed)
Pt presenting with hammer toes on the R with callus formation on the dorsal aspect of the 4th toe.  - metatarsal pad and hammertoe pad placed in shoe.

## 2016-07-26 DIAGNOSIS — K219 Gastro-esophageal reflux disease without esophagitis: Secondary | ICD-10-CM | POA: Diagnosis not present

## 2016-07-26 DIAGNOSIS — E7800 Pure hypercholesterolemia, unspecified: Secondary | ICD-10-CM | POA: Diagnosis not present

## 2016-07-26 DIAGNOSIS — L299 Pruritus, unspecified: Secondary | ICD-10-CM | POA: Diagnosis not present

## 2016-07-26 DIAGNOSIS — R252 Cramp and spasm: Secondary | ICD-10-CM | POA: Diagnosis not present

## 2016-07-26 DIAGNOSIS — Z139 Encounter for screening, unspecified: Secondary | ICD-10-CM | POA: Diagnosis not present

## 2016-07-26 DIAGNOSIS — M159 Polyosteoarthritis, unspecified: Secondary | ICD-10-CM | POA: Diagnosis not present

## 2016-07-26 DIAGNOSIS — Z6824 Body mass index (BMI) 24.0-24.9, adult: Secondary | ICD-10-CM | POA: Diagnosis not present

## 2016-07-26 DIAGNOSIS — I10 Essential (primary) hypertension: Secondary | ICD-10-CM | POA: Diagnosis not present

## 2016-07-26 DIAGNOSIS — M810 Age-related osteoporosis without current pathological fracture: Secondary | ICD-10-CM | POA: Diagnosis not present

## 2016-07-26 DIAGNOSIS — G4700 Insomnia, unspecified: Secondary | ICD-10-CM | POA: Diagnosis not present

## 2016-07-28 ENCOUNTER — Ambulatory Visit: Payer: PPO | Admitting: Sports Medicine

## 2016-08-01 DIAGNOSIS — H35363 Drusen (degenerative) of macula, bilateral: Secondary | ICD-10-CM | POA: Diagnosis not present

## 2016-08-01 DIAGNOSIS — H43822 Vitreomacular adhesion, left eye: Secondary | ICD-10-CM | POA: Diagnosis not present

## 2016-08-01 DIAGNOSIS — H353132 Nonexudative age-related macular degeneration, bilateral, intermediate dry stage: Secondary | ICD-10-CM | POA: Diagnosis not present

## 2016-08-01 DIAGNOSIS — H43812 Vitreous degeneration, left eye: Secondary | ICD-10-CM | POA: Diagnosis not present

## 2016-08-01 DIAGNOSIS — H353212 Exudative age-related macular degeneration, right eye, with inactive choroidal neovascularization: Secondary | ICD-10-CM | POA: Diagnosis not present

## 2016-08-04 ENCOUNTER — Ambulatory Visit (INDEPENDENT_AMBULATORY_CARE_PROVIDER_SITE_OTHER): Payer: PPO | Admitting: Sports Medicine

## 2016-08-04 DIAGNOSIS — L84 Corns and callosities: Secondary | ICD-10-CM | POA: Insufficient documentation

## 2016-08-04 DIAGNOSIS — M25572 Pain in left ankle and joints of left foot: Secondary | ICD-10-CM

## 2016-08-04 NOTE — Assessment & Plan Note (Signed)
Stop antibiotics Cont to soften callus with ointmetn  Today we cut a U pad to float the callus  At conclusion of this she was able to walk without the antalgic limp and felt much less pain  Try this for 6 weeks  Buy soft upper shoes and build support into these

## 2016-08-04 NOTE — Assessment & Plan Note (Signed)
Infected callus on last visit has now cleared obvious signs of infection Still pain with pressure

## 2016-08-04 NOTE — Progress Notes (Signed)
F/U RT foot callus  Patient with chronic problems in left foot Large callus was infected probably on last visit Under 2nd MT head on plantar surface After removal of 2nd toe this has migrated plantarward and through the MT plate  Soaked this every day Used antibiotics Redness resolved  ROS No numbness in foot Soft shoes do not cause pain over hammertoes  PE Older F in NAD BP (!) 175/55  ( plans reck of BP)  Foot  Large firm callus under 2nd MT head No redness or soft tissue swelling now Pain has resolved with pressure  Foot support does not float the callus adequately Shoe is good as it does not put pressure directly over hammertoes Other exam - see prior notes is unchanged  Antalgic gait on left foot strike

## 2016-09-09 DIAGNOSIS — K219 Gastro-esophageal reflux disease without esophagitis: Secondary | ICD-10-CM | POA: Diagnosis not present

## 2016-09-09 DIAGNOSIS — R252 Cramp and spasm: Secondary | ICD-10-CM | POA: Diagnosis not present

## 2016-09-09 DIAGNOSIS — R600 Localized edema: Secondary | ICD-10-CM | POA: Diagnosis not present

## 2016-09-09 DIAGNOSIS — B0229 Other postherpetic nervous system involvement: Secondary | ICD-10-CM | POA: Diagnosis not present

## 2016-09-09 DIAGNOSIS — L299 Pruritus, unspecified: Secondary | ICD-10-CM | POA: Diagnosis not present

## 2016-09-09 DIAGNOSIS — M810 Age-related osteoporosis without current pathological fracture: Secondary | ICD-10-CM | POA: Diagnosis not present

## 2016-09-09 DIAGNOSIS — I10 Essential (primary) hypertension: Secondary | ICD-10-CM | POA: Diagnosis not present

## 2016-09-09 DIAGNOSIS — G4700 Insomnia, unspecified: Secondary | ICD-10-CM | POA: Diagnosis not present

## 2016-09-09 DIAGNOSIS — R51 Headache: Secondary | ICD-10-CM | POA: Diagnosis not present

## 2016-09-09 DIAGNOSIS — E7800 Pure hypercholesterolemia, unspecified: Secondary | ICD-10-CM | POA: Diagnosis not present

## 2016-09-09 DIAGNOSIS — M159 Polyosteoarthritis, unspecified: Secondary | ICD-10-CM | POA: Diagnosis not present

## 2016-09-09 DIAGNOSIS — N182 Chronic kidney disease, stage 2 (mild): Secondary | ICD-10-CM | POA: Diagnosis not present

## 2016-09-13 ENCOUNTER — Ambulatory Visit: Payer: PPO | Admitting: Sports Medicine

## 2016-09-17 DIAGNOSIS — L299 Pruritus, unspecified: Secondary | ICD-10-CM | POA: Diagnosis not present

## 2016-09-17 DIAGNOSIS — M159 Polyosteoarthritis, unspecified: Secondary | ICD-10-CM | POA: Diagnosis not present

## 2016-09-17 DIAGNOSIS — M810 Age-related osteoporosis without current pathological fracture: Secondary | ICD-10-CM | POA: Diagnosis not present

## 2016-09-17 DIAGNOSIS — R252 Cramp and spasm: Secondary | ICD-10-CM | POA: Diagnosis not present

## 2016-09-17 DIAGNOSIS — Z6824 Body mass index (BMI) 24.0-24.9, adult: Secondary | ICD-10-CM | POA: Diagnosis not present

## 2016-09-17 DIAGNOSIS — B0229 Other postherpetic nervous system involvement: Secondary | ICD-10-CM | POA: Diagnosis not present

## 2016-09-17 DIAGNOSIS — E7800 Pure hypercholesterolemia, unspecified: Secondary | ICD-10-CM | POA: Diagnosis not present

## 2016-09-17 DIAGNOSIS — G4700 Insomnia, unspecified: Secondary | ICD-10-CM | POA: Diagnosis not present

## 2016-09-17 DIAGNOSIS — I10 Essential (primary) hypertension: Secondary | ICD-10-CM | POA: Diagnosis not present

## 2016-09-17 DIAGNOSIS — K219 Gastro-esophageal reflux disease without esophagitis: Secondary | ICD-10-CM | POA: Diagnosis not present

## 2016-09-20 ENCOUNTER — Ambulatory Visit (INDEPENDENT_AMBULATORY_CARE_PROVIDER_SITE_OTHER): Payer: PPO | Admitting: Sports Medicine

## 2016-09-20 DIAGNOSIS — L84 Corns and callosities: Secondary | ICD-10-CM

## 2016-09-20 DIAGNOSIS — M2041 Other hammer toe(s) (acquired), right foot: Secondary | ICD-10-CM | POA: Diagnosis not present

## 2016-09-20 NOTE — Assessment & Plan Note (Signed)
Today we tried a small metatarsal pad on the right to see if we could lessen displacement of the hammertoes  She will try this for the next month

## 2016-09-20 NOTE — Progress Notes (Signed)
CC; Left foot ulcer  Patient has chronic problems with the left foot She has breakdown of the entire transverse arch The left second MTP joint has broken through the capsule and cause her to have a thick callus Over last visit she had some early infection around this We treated her with antibiotics They have been using warm water soaks and applying antibacterial ointment daily foot has improved a great deal  Right foot gets a dorsal ulcer from a hammertoe Hammertoe pad has helped She needs to wear a compression sleeve on that leg and difficult to keep a hammertoe pad in place  Review of systems No recent fever or chills Chronic lower leg swelling that does respond to compression Feels more stable walking with a walker  Physical examination Pleasant female in no acute distress BP (!) 152/49   Left foot Thick callus over the second MTP plantar surface The second toe has been amputated There is no fluctuance around the callus No redness No real tenderness to palpation  Lateral forefoot breakdown is noted  Right foot There are hammertoes of 2 and 3 There is some callusing over the PIP joint of second toe

## 2016-09-20 NOTE — Assessment & Plan Note (Addendum)
Infections seems to have resolved  I cut her 4 U pads to place in all shoes  Continue to protect the callus find shoes that do not place pressure on the forefoot

## 2016-09-21 DIAGNOSIS — E7800 Pure hypercholesterolemia, unspecified: Secondary | ICD-10-CM | POA: Diagnosis not present

## 2016-09-21 DIAGNOSIS — M810 Age-related osteoporosis without current pathological fracture: Secondary | ICD-10-CM | POA: Diagnosis not present

## 2016-09-21 DIAGNOSIS — G4700 Insomnia, unspecified: Secondary | ICD-10-CM | POA: Diagnosis not present

## 2016-09-21 DIAGNOSIS — L299 Pruritus, unspecified: Secondary | ICD-10-CM | POA: Diagnosis not present

## 2016-09-21 DIAGNOSIS — K219 Gastro-esophageal reflux disease without esophagitis: Secondary | ICD-10-CM | POA: Diagnosis not present

## 2016-09-21 DIAGNOSIS — R600 Localized edema: Secondary | ICD-10-CM | POA: Diagnosis not present

## 2016-09-21 DIAGNOSIS — N182 Chronic kidney disease, stage 2 (mild): Secondary | ICD-10-CM | POA: Diagnosis not present

## 2016-09-21 DIAGNOSIS — I10 Essential (primary) hypertension: Secondary | ICD-10-CM | POA: Diagnosis not present

## 2016-09-21 DIAGNOSIS — B0229 Other postherpetic nervous system involvement: Secondary | ICD-10-CM | POA: Diagnosis not present

## 2016-09-21 DIAGNOSIS — M159 Polyosteoarthritis, unspecified: Secondary | ICD-10-CM | POA: Diagnosis not present

## 2016-09-21 DIAGNOSIS — R252 Cramp and spasm: Secondary | ICD-10-CM | POA: Diagnosis not present

## 2016-09-21 DIAGNOSIS — J309 Allergic rhinitis, unspecified: Secondary | ICD-10-CM | POA: Diagnosis not present

## 2016-10-05 DIAGNOSIS — R600 Localized edema: Secondary | ICD-10-CM | POA: Diagnosis not present

## 2016-10-05 DIAGNOSIS — I10 Essential (primary) hypertension: Secondary | ICD-10-CM | POA: Diagnosis not present

## 2016-10-05 DIAGNOSIS — M810 Age-related osteoporosis without current pathological fracture: Secondary | ICD-10-CM | POA: Diagnosis not present

## 2016-10-05 DIAGNOSIS — E7800 Pure hypercholesterolemia, unspecified: Secondary | ICD-10-CM | POA: Diagnosis not present

## 2016-10-05 DIAGNOSIS — K219 Gastro-esophageal reflux disease without esophagitis: Secondary | ICD-10-CM | POA: Diagnosis not present

## 2016-10-05 DIAGNOSIS — M159 Polyosteoarthritis, unspecified: Secondary | ICD-10-CM | POA: Diagnosis not present

## 2016-10-05 DIAGNOSIS — R252 Cramp and spasm: Secondary | ICD-10-CM | POA: Diagnosis not present

## 2016-10-05 DIAGNOSIS — B0229 Other postherpetic nervous system involvement: Secondary | ICD-10-CM | POA: Diagnosis not present

## 2016-10-05 DIAGNOSIS — N182 Chronic kidney disease, stage 2 (mild): Secondary | ICD-10-CM | POA: Diagnosis not present

## 2016-10-05 DIAGNOSIS — L299 Pruritus, unspecified: Secondary | ICD-10-CM | POA: Diagnosis not present

## 2016-10-05 DIAGNOSIS — J309 Allergic rhinitis, unspecified: Secondary | ICD-10-CM | POA: Diagnosis not present

## 2016-10-05 DIAGNOSIS — G4700 Insomnia, unspecified: Secondary | ICD-10-CM | POA: Diagnosis not present

## 2016-10-12 DIAGNOSIS — G4700 Insomnia, unspecified: Secondary | ICD-10-CM | POA: Diagnosis not present

## 2016-10-12 DIAGNOSIS — R600 Localized edema: Secondary | ICD-10-CM | POA: Diagnosis not present

## 2016-10-12 DIAGNOSIS — M810 Age-related osteoporosis without current pathological fracture: Secondary | ICD-10-CM | POA: Diagnosis not present

## 2016-10-12 DIAGNOSIS — B0229 Other postherpetic nervous system involvement: Secondary | ICD-10-CM | POA: Diagnosis not present

## 2016-10-12 DIAGNOSIS — M159 Polyosteoarthritis, unspecified: Secondary | ICD-10-CM | POA: Diagnosis not present

## 2016-10-12 DIAGNOSIS — L299 Pruritus, unspecified: Secondary | ICD-10-CM | POA: Diagnosis not present

## 2016-10-12 DIAGNOSIS — K219 Gastro-esophageal reflux disease without esophagitis: Secondary | ICD-10-CM | POA: Diagnosis not present

## 2016-10-12 DIAGNOSIS — E7800 Pure hypercholesterolemia, unspecified: Secondary | ICD-10-CM | POA: Diagnosis not present

## 2016-10-12 DIAGNOSIS — N182 Chronic kidney disease, stage 2 (mild): Secondary | ICD-10-CM | POA: Diagnosis not present

## 2016-10-12 DIAGNOSIS — R252 Cramp and spasm: Secondary | ICD-10-CM | POA: Diagnosis not present

## 2016-10-12 DIAGNOSIS — J309 Allergic rhinitis, unspecified: Secondary | ICD-10-CM | POA: Diagnosis not present

## 2016-10-12 DIAGNOSIS — I10 Essential (primary) hypertension: Secondary | ICD-10-CM | POA: Diagnosis not present

## 2016-10-19 DIAGNOSIS — B0229 Other postherpetic nervous system involvement: Secondary | ICD-10-CM | POA: Diagnosis not present

## 2016-10-19 DIAGNOSIS — N182 Chronic kidney disease, stage 2 (mild): Secondary | ICD-10-CM | POA: Diagnosis not present

## 2016-10-19 DIAGNOSIS — L299 Pruritus, unspecified: Secondary | ICD-10-CM | POA: Diagnosis not present

## 2016-10-19 DIAGNOSIS — M159 Polyosteoarthritis, unspecified: Secondary | ICD-10-CM | POA: Diagnosis not present

## 2016-10-19 DIAGNOSIS — R252 Cramp and spasm: Secondary | ICD-10-CM | POA: Diagnosis not present

## 2016-10-19 DIAGNOSIS — R600 Localized edema: Secondary | ICD-10-CM | POA: Diagnosis not present

## 2016-10-19 DIAGNOSIS — M810 Age-related osteoporosis without current pathological fracture: Secondary | ICD-10-CM | POA: Diagnosis not present

## 2016-10-19 DIAGNOSIS — E7800 Pure hypercholesterolemia, unspecified: Secondary | ICD-10-CM | POA: Diagnosis not present

## 2016-10-19 DIAGNOSIS — I10 Essential (primary) hypertension: Secondary | ICD-10-CM | POA: Diagnosis not present

## 2016-10-19 DIAGNOSIS — G4700 Insomnia, unspecified: Secondary | ICD-10-CM | POA: Diagnosis not present

## 2016-10-19 DIAGNOSIS — K219 Gastro-esophageal reflux disease without esophagitis: Secondary | ICD-10-CM | POA: Diagnosis not present

## 2016-10-19 DIAGNOSIS — J309 Allergic rhinitis, unspecified: Secondary | ICD-10-CM | POA: Diagnosis not present

## 2016-10-26 DIAGNOSIS — J309 Allergic rhinitis, unspecified: Secondary | ICD-10-CM | POA: Diagnosis not present

## 2016-10-26 DIAGNOSIS — L299 Pruritus, unspecified: Secondary | ICD-10-CM | POA: Diagnosis not present

## 2016-10-26 DIAGNOSIS — R600 Localized edema: Secondary | ICD-10-CM | POA: Diagnosis not present

## 2016-10-26 DIAGNOSIS — B0229 Other postherpetic nervous system involvement: Secondary | ICD-10-CM | POA: Diagnosis not present

## 2016-10-26 DIAGNOSIS — M810 Age-related osteoporosis without current pathological fracture: Secondary | ICD-10-CM | POA: Diagnosis not present

## 2016-10-26 DIAGNOSIS — K219 Gastro-esophageal reflux disease without esophagitis: Secondary | ICD-10-CM | POA: Diagnosis not present

## 2016-10-26 DIAGNOSIS — G4700 Insomnia, unspecified: Secondary | ICD-10-CM | POA: Diagnosis not present

## 2016-10-26 DIAGNOSIS — E7800 Pure hypercholesterolemia, unspecified: Secondary | ICD-10-CM | POA: Diagnosis not present

## 2016-10-26 DIAGNOSIS — N182 Chronic kidney disease, stage 2 (mild): Secondary | ICD-10-CM | POA: Diagnosis not present

## 2016-10-26 DIAGNOSIS — M159 Polyosteoarthritis, unspecified: Secondary | ICD-10-CM | POA: Diagnosis not present

## 2016-10-26 DIAGNOSIS — I10 Essential (primary) hypertension: Secondary | ICD-10-CM | POA: Diagnosis not present

## 2016-10-26 DIAGNOSIS — R252 Cramp and spasm: Secondary | ICD-10-CM | POA: Diagnosis not present

## 2016-10-27 DIAGNOSIS — S7002XD Contusion of left hip, subsequent encounter: Secondary | ICD-10-CM | POA: Diagnosis not present

## 2016-10-27 DIAGNOSIS — M7740 Metatarsalgia, unspecified foot: Secondary | ICD-10-CM | POA: Diagnosis not present

## 2016-11-02 DIAGNOSIS — L299 Pruritus, unspecified: Secondary | ICD-10-CM | POA: Diagnosis not present

## 2016-11-02 DIAGNOSIS — E7800 Pure hypercholesterolemia, unspecified: Secondary | ICD-10-CM | POA: Diagnosis not present

## 2016-11-02 DIAGNOSIS — I10 Essential (primary) hypertension: Secondary | ICD-10-CM | POA: Diagnosis not present

## 2016-11-02 DIAGNOSIS — B0229 Other postherpetic nervous system involvement: Secondary | ICD-10-CM | POA: Diagnosis not present

## 2016-11-02 DIAGNOSIS — J309 Allergic rhinitis, unspecified: Secondary | ICD-10-CM | POA: Diagnosis not present

## 2016-11-02 DIAGNOSIS — R600 Localized edema: Secondary | ICD-10-CM | POA: Diagnosis not present

## 2016-11-02 DIAGNOSIS — R252 Cramp and spasm: Secondary | ICD-10-CM | POA: Diagnosis not present

## 2016-11-02 DIAGNOSIS — M159 Polyosteoarthritis, unspecified: Secondary | ICD-10-CM | POA: Diagnosis not present

## 2016-11-02 DIAGNOSIS — G4700 Insomnia, unspecified: Secondary | ICD-10-CM | POA: Diagnosis not present

## 2016-11-02 DIAGNOSIS — K219 Gastro-esophageal reflux disease without esophagitis: Secondary | ICD-10-CM | POA: Diagnosis not present

## 2016-11-02 DIAGNOSIS — M810 Age-related osteoporosis without current pathological fracture: Secondary | ICD-10-CM | POA: Diagnosis not present

## 2016-11-02 DIAGNOSIS — N182 Chronic kidney disease, stage 2 (mild): Secondary | ICD-10-CM | POA: Diagnosis not present

## 2016-11-09 DIAGNOSIS — M810 Age-related osteoporosis without current pathological fracture: Secondary | ICD-10-CM | POA: Diagnosis not present

## 2016-11-09 DIAGNOSIS — N182 Chronic kidney disease, stage 2 (mild): Secondary | ICD-10-CM | POA: Diagnosis not present

## 2016-11-09 DIAGNOSIS — M159 Polyosteoarthritis, unspecified: Secondary | ICD-10-CM | POA: Diagnosis not present

## 2016-11-09 DIAGNOSIS — I10 Essential (primary) hypertension: Secondary | ICD-10-CM | POA: Diagnosis not present

## 2016-11-09 DIAGNOSIS — Z9181 History of falling: Secondary | ICD-10-CM | POA: Diagnosis not present

## 2016-11-09 DIAGNOSIS — R252 Cramp and spasm: Secondary | ICD-10-CM | POA: Diagnosis not present

## 2016-11-09 DIAGNOSIS — K219 Gastro-esophageal reflux disease without esophagitis: Secondary | ICD-10-CM | POA: Diagnosis not present

## 2016-11-09 DIAGNOSIS — E7800 Pure hypercholesterolemia, unspecified: Secondary | ICD-10-CM | POA: Diagnosis not present

## 2016-11-09 DIAGNOSIS — G4700 Insomnia, unspecified: Secondary | ICD-10-CM | POA: Diagnosis not present

## 2016-11-09 DIAGNOSIS — L299 Pruritus, unspecified: Secondary | ICD-10-CM | POA: Diagnosis not present

## 2016-11-09 DIAGNOSIS — R600 Localized edema: Secondary | ICD-10-CM | POA: Diagnosis not present

## 2016-11-09 DIAGNOSIS — B0229 Other postherpetic nervous system involvement: Secondary | ICD-10-CM | POA: Diagnosis not present

## 2016-11-16 DIAGNOSIS — R600 Localized edema: Secondary | ICD-10-CM | POA: Diagnosis not present

## 2016-11-16 DIAGNOSIS — G4700 Insomnia, unspecified: Secondary | ICD-10-CM | POA: Diagnosis not present

## 2016-11-16 DIAGNOSIS — B0229 Other postherpetic nervous system involvement: Secondary | ICD-10-CM | POA: Diagnosis not present

## 2016-11-16 DIAGNOSIS — R252 Cramp and spasm: Secondary | ICD-10-CM | POA: Diagnosis not present

## 2016-11-16 DIAGNOSIS — I10 Essential (primary) hypertension: Secondary | ICD-10-CM | POA: Diagnosis not present

## 2016-11-16 DIAGNOSIS — K219 Gastro-esophageal reflux disease without esophagitis: Secondary | ICD-10-CM | POA: Diagnosis not present

## 2016-11-16 DIAGNOSIS — E7800 Pure hypercholesterolemia, unspecified: Secondary | ICD-10-CM | POA: Diagnosis not present

## 2016-11-16 DIAGNOSIS — L299 Pruritus, unspecified: Secondary | ICD-10-CM | POA: Diagnosis not present

## 2016-11-16 DIAGNOSIS — J309 Allergic rhinitis, unspecified: Secondary | ICD-10-CM | POA: Diagnosis not present

## 2016-11-16 DIAGNOSIS — M159 Polyosteoarthritis, unspecified: Secondary | ICD-10-CM | POA: Diagnosis not present

## 2016-11-16 DIAGNOSIS — N182 Chronic kidney disease, stage 2 (mild): Secondary | ICD-10-CM | POA: Diagnosis not present

## 2016-11-16 DIAGNOSIS — M810 Age-related osteoporosis without current pathological fracture: Secondary | ICD-10-CM | POA: Diagnosis not present

## 2016-11-23 DIAGNOSIS — E7800 Pure hypercholesterolemia, unspecified: Secondary | ICD-10-CM | POA: Diagnosis not present

## 2016-11-23 DIAGNOSIS — L299 Pruritus, unspecified: Secondary | ICD-10-CM | POA: Diagnosis not present

## 2016-11-23 DIAGNOSIS — N182 Chronic kidney disease, stage 2 (mild): Secondary | ICD-10-CM | POA: Diagnosis not present

## 2016-11-23 DIAGNOSIS — D649 Anemia, unspecified: Secondary | ICD-10-CM | POA: Diagnosis not present

## 2016-11-23 DIAGNOSIS — G4700 Insomnia, unspecified: Secondary | ICD-10-CM | POA: Diagnosis not present

## 2016-11-23 DIAGNOSIS — B0229 Other postherpetic nervous system involvement: Secondary | ICD-10-CM | POA: Diagnosis not present

## 2016-11-23 DIAGNOSIS — I10 Essential (primary) hypertension: Secondary | ICD-10-CM | POA: Diagnosis not present

## 2016-11-23 DIAGNOSIS — K219 Gastro-esophageal reflux disease without esophagitis: Secondary | ICD-10-CM | POA: Diagnosis not present

## 2016-11-23 DIAGNOSIS — M810 Age-related osteoporosis without current pathological fracture: Secondary | ICD-10-CM | POA: Diagnosis not present

## 2016-11-23 DIAGNOSIS — Z9181 History of falling: Secondary | ICD-10-CM | POA: Diagnosis not present

## 2016-11-23 DIAGNOSIS — R600 Localized edema: Secondary | ICD-10-CM | POA: Diagnosis not present

## 2016-11-23 DIAGNOSIS — M159 Polyosteoarthritis, unspecified: Secondary | ICD-10-CM | POA: Diagnosis not present

## 2016-11-23 DIAGNOSIS — Z139 Encounter for screening, unspecified: Secondary | ICD-10-CM | POA: Diagnosis not present

## 2016-11-25 DIAGNOSIS — R2689 Other abnormalities of gait and mobility: Secondary | ICD-10-CM | POA: Diagnosis not present

## 2016-11-25 DIAGNOSIS — M25552 Pain in left hip: Secondary | ICD-10-CM | POA: Diagnosis not present

## 2016-11-29 DIAGNOSIS — M25552 Pain in left hip: Secondary | ICD-10-CM | POA: Diagnosis not present

## 2016-11-29 DIAGNOSIS — R2689 Other abnormalities of gait and mobility: Secondary | ICD-10-CM | POA: Diagnosis not present

## 2016-12-02 DIAGNOSIS — M25552 Pain in left hip: Secondary | ICD-10-CM | POA: Diagnosis not present

## 2016-12-02 DIAGNOSIS — R2689 Other abnormalities of gait and mobility: Secondary | ICD-10-CM | POA: Diagnosis not present

## 2016-12-05 DIAGNOSIS — R2689 Other abnormalities of gait and mobility: Secondary | ICD-10-CM | POA: Diagnosis not present

## 2016-12-05 DIAGNOSIS — M25552 Pain in left hip: Secondary | ICD-10-CM | POA: Diagnosis not present

## 2016-12-08 DIAGNOSIS — S7002XD Contusion of left hip, subsequent encounter: Secondary | ICD-10-CM | POA: Diagnosis not present

## 2016-12-08 DIAGNOSIS — R2689 Other abnormalities of gait and mobility: Secondary | ICD-10-CM | POA: Diagnosis not present

## 2016-12-08 DIAGNOSIS — M25552 Pain in left hip: Secondary | ICD-10-CM | POA: Diagnosis not present

## 2016-12-12 DIAGNOSIS — R2689 Other abnormalities of gait and mobility: Secondary | ICD-10-CM | POA: Diagnosis not present

## 2016-12-12 DIAGNOSIS — M25552 Pain in left hip: Secondary | ICD-10-CM | POA: Diagnosis not present

## 2016-12-15 DIAGNOSIS — M25552 Pain in left hip: Secondary | ICD-10-CM | POA: Diagnosis not present

## 2016-12-15 DIAGNOSIS — R2689 Other abnormalities of gait and mobility: Secondary | ICD-10-CM | POA: Diagnosis not present

## 2016-12-23 DIAGNOSIS — B0229 Other postherpetic nervous system involvement: Secondary | ICD-10-CM | POA: Diagnosis not present

## 2016-12-23 DIAGNOSIS — E7800 Pure hypercholesterolemia, unspecified: Secondary | ICD-10-CM | POA: Diagnosis not present

## 2016-12-23 DIAGNOSIS — Z1389 Encounter for screening for other disorder: Secondary | ICD-10-CM | POA: Diagnosis not present

## 2016-12-23 DIAGNOSIS — K219 Gastro-esophageal reflux disease without esophagitis: Secondary | ICD-10-CM | POA: Diagnosis not present

## 2016-12-23 DIAGNOSIS — M159 Polyosteoarthritis, unspecified: Secondary | ICD-10-CM | POA: Diagnosis not present

## 2016-12-23 DIAGNOSIS — I749 Embolism and thrombosis of unspecified artery: Secondary | ICD-10-CM | POA: Diagnosis not present

## 2016-12-23 DIAGNOSIS — M810 Age-related osteoporosis without current pathological fracture: Secondary | ICD-10-CM | POA: Diagnosis not present

## 2016-12-23 DIAGNOSIS — R5382 Chronic fatigue, unspecified: Secondary | ICD-10-CM | POA: Diagnosis not present

## 2016-12-23 DIAGNOSIS — I10 Essential (primary) hypertension: Secondary | ICD-10-CM | POA: Diagnosis not present

## 2016-12-23 DIAGNOSIS — N812 Incomplete uterovaginal prolapse: Secondary | ICD-10-CM | POA: Diagnosis not present

## 2016-12-23 DIAGNOSIS — G4700 Insomnia, unspecified: Secondary | ICD-10-CM | POA: Diagnosis not present

## 2016-12-23 DIAGNOSIS — R2681 Unsteadiness on feet: Secondary | ICD-10-CM | POA: Diagnosis not present

## 2017-01-05 DIAGNOSIS — N182 Chronic kidney disease, stage 2 (mild): Secondary | ICD-10-CM | POA: Diagnosis not present

## 2017-01-05 DIAGNOSIS — M25552 Pain in left hip: Secondary | ICD-10-CM | POA: Diagnosis not present

## 2017-01-05 DIAGNOSIS — G4700 Insomnia, unspecified: Secondary | ICD-10-CM | POA: Diagnosis not present

## 2017-01-05 DIAGNOSIS — I10 Essential (primary) hypertension: Secondary | ICD-10-CM | POA: Diagnosis not present

## 2017-01-05 DIAGNOSIS — E871 Hypo-osmolality and hyponatremia: Secondary | ICD-10-CM | POA: Diagnosis not present

## 2017-01-05 DIAGNOSIS — M810 Age-related osteoporosis without current pathological fracture: Secondary | ICD-10-CM | POA: Diagnosis not present

## 2017-01-05 DIAGNOSIS — R600 Localized edema: Secondary | ICD-10-CM | POA: Diagnosis not present

## 2017-01-05 DIAGNOSIS — E7800 Pure hypercholesterolemia, unspecified: Secondary | ICD-10-CM | POA: Diagnosis not present

## 2017-01-05 DIAGNOSIS — K219 Gastro-esophageal reflux disease without esophagitis: Secondary | ICD-10-CM | POA: Diagnosis not present

## 2017-01-05 DIAGNOSIS — L299 Pruritus, unspecified: Secondary | ICD-10-CM | POA: Diagnosis not present

## 2017-01-05 DIAGNOSIS — Z23 Encounter for immunization: Secondary | ICD-10-CM | POA: Diagnosis not present

## 2017-01-05 DIAGNOSIS — M159 Polyosteoarthritis, unspecified: Secondary | ICD-10-CM | POA: Diagnosis not present

## 2017-01-06 DIAGNOSIS — L84 Corns and callosities: Secondary | ICD-10-CM | POA: Diagnosis not present

## 2017-01-06 DIAGNOSIS — M79672 Pain in left foot: Secondary | ICD-10-CM | POA: Diagnosis not present

## 2017-01-06 DIAGNOSIS — G8929 Other chronic pain: Secondary | ICD-10-CM | POA: Diagnosis not present

## 2017-01-16 DIAGNOSIS — H43813 Vitreous degeneration, bilateral: Secondary | ICD-10-CM | POA: Diagnosis not present

## 2017-01-16 DIAGNOSIS — H353212 Exudative age-related macular degeneration, right eye, with inactive choroidal neovascularization: Secondary | ICD-10-CM | POA: Diagnosis not present

## 2017-01-16 DIAGNOSIS — H353132 Nonexudative age-related macular degeneration, bilateral, intermediate dry stage: Secondary | ICD-10-CM | POA: Diagnosis not present

## 2017-01-16 DIAGNOSIS — H35363 Drusen (degenerative) of macula, bilateral: Secondary | ICD-10-CM | POA: Diagnosis not present

## 2017-01-19 DIAGNOSIS — I10 Essential (primary) hypertension: Secondary | ICD-10-CM | POA: Diagnosis not present

## 2017-01-19 DIAGNOSIS — E871 Hypo-osmolality and hyponatremia: Secondary | ICD-10-CM | POA: Diagnosis not present

## 2017-01-19 DIAGNOSIS — R600 Localized edema: Secondary | ICD-10-CM | POA: Diagnosis not present

## 2017-01-19 DIAGNOSIS — K219 Gastro-esophageal reflux disease without esophagitis: Secondary | ICD-10-CM | POA: Diagnosis not present

## 2017-01-19 DIAGNOSIS — B0229 Other postherpetic nervous system involvement: Secondary | ICD-10-CM | POA: Diagnosis not present

## 2017-01-19 DIAGNOSIS — M159 Polyosteoarthritis, unspecified: Secondary | ICD-10-CM | POA: Diagnosis not present

## 2017-01-19 DIAGNOSIS — E7800 Pure hypercholesterolemia, unspecified: Secondary | ICD-10-CM | POA: Diagnosis not present

## 2017-01-19 DIAGNOSIS — I749 Embolism and thrombosis of unspecified artery: Secondary | ICD-10-CM | POA: Diagnosis not present

## 2017-01-19 DIAGNOSIS — N182 Chronic kidney disease, stage 2 (mild): Secondary | ICD-10-CM | POA: Diagnosis not present

## 2017-01-19 DIAGNOSIS — M810 Age-related osteoporosis without current pathological fracture: Secondary | ICD-10-CM | POA: Diagnosis not present

## 2017-01-19 DIAGNOSIS — L299 Pruritus, unspecified: Secondary | ICD-10-CM | POA: Diagnosis not present

## 2017-01-19 DIAGNOSIS — G4700 Insomnia, unspecified: Secondary | ICD-10-CM | POA: Diagnosis not present

## 2017-02-07 DIAGNOSIS — C44329 Squamous cell carcinoma of skin of other parts of face: Secondary | ICD-10-CM | POA: Diagnosis not present

## 2017-02-07 DIAGNOSIS — L578 Other skin changes due to chronic exposure to nonionizing radiation: Secondary | ICD-10-CM | POA: Diagnosis not present

## 2017-02-07 DIAGNOSIS — L821 Other seborrheic keratosis: Secondary | ICD-10-CM | POA: Diagnosis not present

## 2017-02-07 DIAGNOSIS — L820 Inflamed seborrheic keratosis: Secondary | ICD-10-CM | POA: Diagnosis not present

## 2017-02-17 DIAGNOSIS — I10 Essential (primary) hypertension: Secondary | ICD-10-CM | POA: Diagnosis not present

## 2017-02-17 DIAGNOSIS — M159 Polyosteoarthritis, unspecified: Secondary | ICD-10-CM | POA: Diagnosis not present

## 2017-02-17 DIAGNOSIS — E7800 Pure hypercholesterolemia, unspecified: Secondary | ICD-10-CM | POA: Diagnosis not present

## 2017-02-17 DIAGNOSIS — M810 Age-related osteoporosis without current pathological fracture: Secondary | ICD-10-CM | POA: Diagnosis not present

## 2017-02-17 DIAGNOSIS — R2681 Unsteadiness on feet: Secondary | ICD-10-CM | POA: Diagnosis not present

## 2017-02-17 DIAGNOSIS — L299 Pruritus, unspecified: Secondary | ICD-10-CM | POA: Diagnosis not present

## 2017-02-17 DIAGNOSIS — R252 Cramp and spasm: Secondary | ICD-10-CM | POA: Diagnosis not present

## 2017-02-17 DIAGNOSIS — K219 Gastro-esophageal reflux disease without esophagitis: Secondary | ICD-10-CM | POA: Diagnosis not present

## 2017-02-17 DIAGNOSIS — R5382 Chronic fatigue, unspecified: Secondary | ICD-10-CM | POA: Diagnosis not present

## 2017-02-17 DIAGNOSIS — R600 Localized edema: Secondary | ICD-10-CM | POA: Diagnosis not present

## 2017-02-17 DIAGNOSIS — G4700 Insomnia, unspecified: Secondary | ICD-10-CM | POA: Diagnosis not present

## 2017-02-17 DIAGNOSIS — B0229 Other postherpetic nervous system involvement: Secondary | ICD-10-CM | POA: Diagnosis not present

## 2017-02-28 DIAGNOSIS — L300 Nummular dermatitis: Secondary | ICD-10-CM | POA: Diagnosis not present

## 2017-02-28 DIAGNOSIS — L821 Other seborrheic keratosis: Secondary | ICD-10-CM | POA: Diagnosis not present

## 2017-02-28 DIAGNOSIS — L820 Inflamed seborrheic keratosis: Secondary | ICD-10-CM | POA: Diagnosis not present

## 2017-03-21 DIAGNOSIS — M159 Polyosteoarthritis, unspecified: Secondary | ICD-10-CM | POA: Diagnosis not present

## 2017-03-21 DIAGNOSIS — E7800 Pure hypercholesterolemia, unspecified: Secondary | ICD-10-CM | POA: Diagnosis not present

## 2017-03-21 DIAGNOSIS — K219 Gastro-esophageal reflux disease without esophagitis: Secondary | ICD-10-CM | POA: Diagnosis not present

## 2017-03-21 DIAGNOSIS — L299 Pruritus, unspecified: Secondary | ICD-10-CM | POA: Diagnosis not present

## 2017-03-21 DIAGNOSIS — G4700 Insomnia, unspecified: Secondary | ICD-10-CM | POA: Diagnosis not present

## 2017-03-21 DIAGNOSIS — R600 Localized edema: Secondary | ICD-10-CM | POA: Diagnosis not present

## 2017-03-21 DIAGNOSIS — B0229 Other postherpetic nervous system involvement: Secondary | ICD-10-CM | POA: Diagnosis not present

## 2017-03-21 DIAGNOSIS — I10 Essential (primary) hypertension: Secondary | ICD-10-CM | POA: Diagnosis not present

## 2017-03-21 DIAGNOSIS — K580 Irritable bowel syndrome with diarrhea: Secondary | ICD-10-CM | POA: Diagnosis not present

## 2017-03-21 DIAGNOSIS — R252 Cramp and spasm: Secondary | ICD-10-CM | POA: Diagnosis not present

## 2017-03-21 DIAGNOSIS — E871 Hypo-osmolality and hyponatremia: Secondary | ICD-10-CM | POA: Diagnosis not present

## 2017-03-21 DIAGNOSIS — R2681 Unsteadiness on feet: Secondary | ICD-10-CM | POA: Diagnosis not present

## 2017-03-21 DIAGNOSIS — M810 Age-related osteoporosis without current pathological fracture: Secondary | ICD-10-CM | POA: Diagnosis not present

## 2017-04-17 DIAGNOSIS — M810 Age-related osteoporosis without current pathological fracture: Secondary | ICD-10-CM | POA: Diagnosis not present

## 2017-04-17 DIAGNOSIS — N182 Chronic kidney disease, stage 2 (mild): Secondary | ICD-10-CM | POA: Diagnosis not present

## 2017-04-17 DIAGNOSIS — R252 Cramp and spasm: Secondary | ICD-10-CM | POA: Diagnosis not present

## 2017-04-17 DIAGNOSIS — L299 Pruritus, unspecified: Secondary | ICD-10-CM | POA: Diagnosis not present

## 2017-04-17 DIAGNOSIS — J309 Allergic rhinitis, unspecified: Secondary | ICD-10-CM | POA: Diagnosis not present

## 2017-04-17 DIAGNOSIS — K219 Gastro-esophageal reflux disease without esophagitis: Secondary | ICD-10-CM | POA: Diagnosis not present

## 2017-04-17 DIAGNOSIS — E871 Hypo-osmolality and hyponatremia: Secondary | ICD-10-CM | POA: Diagnosis not present

## 2017-04-17 DIAGNOSIS — B0229 Other postherpetic nervous system involvement: Secondary | ICD-10-CM | POA: Diagnosis not present

## 2017-04-17 DIAGNOSIS — R600 Localized edema: Secondary | ICD-10-CM | POA: Diagnosis not present

## 2017-04-17 DIAGNOSIS — G4700 Insomnia, unspecified: Secondary | ICD-10-CM | POA: Diagnosis not present

## 2017-04-17 DIAGNOSIS — E7800 Pure hypercholesterolemia, unspecified: Secondary | ICD-10-CM | POA: Diagnosis not present

## 2017-04-17 DIAGNOSIS — M159 Polyosteoarthritis, unspecified: Secondary | ICD-10-CM | POA: Diagnosis not present

## 2017-05-15 DIAGNOSIS — M159 Polyosteoarthritis, unspecified: Secondary | ICD-10-CM | POA: Diagnosis not present

## 2017-05-15 DIAGNOSIS — E871 Hypo-osmolality and hyponatremia: Secondary | ICD-10-CM | POA: Diagnosis not present

## 2017-05-15 DIAGNOSIS — M810 Age-related osteoporosis without current pathological fracture: Secondary | ICD-10-CM | POA: Diagnosis not present

## 2017-05-15 DIAGNOSIS — R600 Localized edema: Secondary | ICD-10-CM | POA: Diagnosis not present

## 2017-05-15 DIAGNOSIS — B0229 Other postherpetic nervous system involvement: Secondary | ICD-10-CM | POA: Diagnosis not present

## 2017-05-15 DIAGNOSIS — G4700 Insomnia, unspecified: Secondary | ICD-10-CM | POA: Diagnosis not present

## 2017-05-15 DIAGNOSIS — N182 Chronic kidney disease, stage 2 (mild): Secondary | ICD-10-CM | POA: Diagnosis not present

## 2017-05-15 DIAGNOSIS — K219 Gastro-esophageal reflux disease without esophagitis: Secondary | ICD-10-CM | POA: Diagnosis not present

## 2017-05-15 DIAGNOSIS — R252 Cramp and spasm: Secondary | ICD-10-CM | POA: Diagnosis not present

## 2017-05-15 DIAGNOSIS — J309 Allergic rhinitis, unspecified: Secondary | ICD-10-CM | POA: Diagnosis not present

## 2017-05-15 DIAGNOSIS — E7800 Pure hypercholesterolemia, unspecified: Secondary | ICD-10-CM | POA: Diagnosis not present

## 2017-05-15 DIAGNOSIS — L299 Pruritus, unspecified: Secondary | ICD-10-CM | POA: Diagnosis not present

## 2017-06-12 DIAGNOSIS — R600 Localized edema: Secondary | ICD-10-CM | POA: Diagnosis not present

## 2017-06-12 DIAGNOSIS — B0229 Other postherpetic nervous system involvement: Secondary | ICD-10-CM | POA: Diagnosis not present

## 2017-06-12 DIAGNOSIS — K219 Gastro-esophageal reflux disease without esophagitis: Secondary | ICD-10-CM | POA: Diagnosis not present

## 2017-06-12 DIAGNOSIS — J309 Allergic rhinitis, unspecified: Secondary | ICD-10-CM | POA: Diagnosis not present

## 2017-06-12 DIAGNOSIS — L299 Pruritus, unspecified: Secondary | ICD-10-CM | POA: Diagnosis not present

## 2017-06-12 DIAGNOSIS — R252 Cramp and spasm: Secondary | ICD-10-CM | POA: Diagnosis not present

## 2017-06-12 DIAGNOSIS — N182 Chronic kidney disease, stage 2 (mild): Secondary | ICD-10-CM | POA: Diagnosis not present

## 2017-06-12 DIAGNOSIS — E7800 Pure hypercholesterolemia, unspecified: Secondary | ICD-10-CM | POA: Diagnosis not present

## 2017-06-12 DIAGNOSIS — E871 Hypo-osmolality and hyponatremia: Secondary | ICD-10-CM | POA: Diagnosis not present

## 2017-06-12 DIAGNOSIS — G4700 Insomnia, unspecified: Secondary | ICD-10-CM | POA: Diagnosis not present

## 2017-06-12 DIAGNOSIS — M159 Polyosteoarthritis, unspecified: Secondary | ICD-10-CM | POA: Diagnosis not present

## 2017-06-12 DIAGNOSIS — M810 Age-related osteoporosis without current pathological fracture: Secondary | ICD-10-CM | POA: Diagnosis not present

## 2017-06-22 DIAGNOSIS — M8589 Other specified disorders of bone density and structure, multiple sites: Secondary | ICD-10-CM | POA: Diagnosis not present

## 2017-06-22 DIAGNOSIS — Z853 Personal history of malignant neoplasm of breast: Secondary | ICD-10-CM | POA: Diagnosis not present

## 2017-06-22 DIAGNOSIS — Z9013 Acquired absence of bilateral breasts and nipples: Secondary | ICD-10-CM | POA: Diagnosis not present

## 2017-06-22 DIAGNOSIS — M8580 Other specified disorders of bone density and structure, unspecified site: Secondary | ICD-10-CM | POA: Diagnosis not present

## 2017-06-22 DIAGNOSIS — E871 Hypo-osmolality and hyponatremia: Secondary | ICD-10-CM | POA: Diagnosis not present

## 2017-06-22 DIAGNOSIS — R29898 Other symptoms and signs involving the musculoskeletal system: Secondary | ICD-10-CM | POA: Diagnosis not present

## 2017-06-22 DIAGNOSIS — N189 Chronic kidney disease, unspecified: Secondary | ICD-10-CM | POA: Diagnosis not present

## 2017-06-22 DIAGNOSIS — D649 Anemia, unspecified: Secondary | ICD-10-CM | POA: Diagnosis not present

## 2017-07-10 DIAGNOSIS — K219 Gastro-esophageal reflux disease without esophagitis: Secondary | ICD-10-CM | POA: Diagnosis not present

## 2017-07-10 DIAGNOSIS — I10 Essential (primary) hypertension: Secondary | ICD-10-CM | POA: Diagnosis not present

## 2017-07-10 DIAGNOSIS — R252 Cramp and spasm: Secondary | ICD-10-CM | POA: Diagnosis not present

## 2017-07-10 DIAGNOSIS — R600 Localized edema: Secondary | ICD-10-CM | POA: Diagnosis not present

## 2017-07-10 DIAGNOSIS — M159 Polyosteoarthritis, unspecified: Secondary | ICD-10-CM | POA: Diagnosis not present

## 2017-07-10 DIAGNOSIS — L299 Pruritus, unspecified: Secondary | ICD-10-CM | POA: Diagnosis not present

## 2017-07-10 DIAGNOSIS — M810 Age-related osteoporosis without current pathological fracture: Secondary | ICD-10-CM | POA: Diagnosis not present

## 2017-07-10 DIAGNOSIS — G4700 Insomnia, unspecified: Secondary | ICD-10-CM | POA: Diagnosis not present

## 2017-07-10 DIAGNOSIS — J309 Allergic rhinitis, unspecified: Secondary | ICD-10-CM | POA: Diagnosis not present

## 2017-07-10 DIAGNOSIS — E7800 Pure hypercholesterolemia, unspecified: Secondary | ICD-10-CM | POA: Diagnosis not present

## 2017-07-10 DIAGNOSIS — B0229 Other postherpetic nervous system involvement: Secondary | ICD-10-CM | POA: Diagnosis not present

## 2017-07-10 DIAGNOSIS — N182 Chronic kidney disease, stage 2 (mild): Secondary | ICD-10-CM | POA: Diagnosis not present

## 2017-07-19 DIAGNOSIS — M25552 Pain in left hip: Secondary | ICD-10-CM | POA: Diagnosis not present

## 2017-07-19 DIAGNOSIS — M48061 Spinal stenosis, lumbar region without neurogenic claudication: Secondary | ICD-10-CM | POA: Diagnosis not present

## 2017-07-21 DIAGNOSIS — R55 Syncope and collapse: Secondary | ICD-10-CM | POA: Diagnosis not present

## 2017-07-21 DIAGNOSIS — I10 Essential (primary) hypertension: Secondary | ICD-10-CM | POA: Diagnosis not present

## 2017-07-21 DIAGNOSIS — S0990XA Unspecified injury of head, initial encounter: Secondary | ICD-10-CM | POA: Diagnosis not present

## 2017-07-21 DIAGNOSIS — S199XXA Unspecified injury of neck, initial encounter: Secondary | ICD-10-CM | POA: Diagnosis not present

## 2017-07-21 DIAGNOSIS — M1990 Unspecified osteoarthritis, unspecified site: Secondary | ICD-10-CM | POA: Diagnosis not present

## 2017-07-24 DIAGNOSIS — H353132 Nonexudative age-related macular degeneration, bilateral, intermediate dry stage: Secondary | ICD-10-CM | POA: Diagnosis not present

## 2017-07-24 DIAGNOSIS — H43813 Vitreous degeneration, bilateral: Secondary | ICD-10-CM | POA: Diagnosis not present

## 2017-07-24 DIAGNOSIS — H353212 Exudative age-related macular degeneration, right eye, with inactive choroidal neovascularization: Secondary | ICD-10-CM | POA: Diagnosis not present

## 2017-07-24 DIAGNOSIS — H353211 Exudative age-related macular degeneration, right eye, with active choroidal neovascularization: Secondary | ICD-10-CM | POA: Diagnosis not present

## 2017-07-31 DIAGNOSIS — D1730 Benign lipomatous neoplasm of skin and subcutaneous tissue of unspecified sites: Secondary | ICD-10-CM | POA: Diagnosis not present

## 2017-07-31 DIAGNOSIS — D1720 Benign lipomatous neoplasm of skin and subcutaneous tissue of unspecified limb: Secondary | ICD-10-CM | POA: Diagnosis not present

## 2017-07-31 DIAGNOSIS — M48061 Spinal stenosis, lumbar region without neurogenic claudication: Secondary | ICD-10-CM | POA: Diagnosis not present

## 2017-08-08 DIAGNOSIS — Z139 Encounter for screening, unspecified: Secondary | ICD-10-CM | POA: Diagnosis not present

## 2017-08-08 DIAGNOSIS — N182 Chronic kidney disease, stage 2 (mild): Secondary | ICD-10-CM | POA: Diagnosis not present

## 2017-08-08 DIAGNOSIS — Z9181 History of falling: Secondary | ICD-10-CM | POA: Diagnosis not present

## 2017-08-08 DIAGNOSIS — R600 Localized edema: Secondary | ICD-10-CM | POA: Diagnosis not present

## 2017-08-08 DIAGNOSIS — E7800 Pure hypercholesterolemia, unspecified: Secondary | ICD-10-CM | POA: Diagnosis not present

## 2017-08-08 DIAGNOSIS — Z0000 Encounter for general adult medical examination without abnormal findings: Secondary | ICD-10-CM | POA: Diagnosis not present

## 2017-08-08 DIAGNOSIS — M810 Age-related osteoporosis without current pathological fracture: Secondary | ICD-10-CM | POA: Diagnosis not present

## 2017-08-08 DIAGNOSIS — J309 Allergic rhinitis, unspecified: Secondary | ICD-10-CM | POA: Diagnosis not present

## 2017-08-08 DIAGNOSIS — R252 Cramp and spasm: Secondary | ICD-10-CM | POA: Diagnosis not present

## 2017-08-08 DIAGNOSIS — E871 Hypo-osmolality and hyponatremia: Secondary | ICD-10-CM | POA: Diagnosis not present

## 2017-08-08 DIAGNOSIS — G4700 Insomnia, unspecified: Secondary | ICD-10-CM | POA: Diagnosis not present

## 2017-08-08 DIAGNOSIS — E785 Hyperlipidemia, unspecified: Secondary | ICD-10-CM | POA: Diagnosis not present

## 2017-08-08 DIAGNOSIS — B0229 Other postherpetic nervous system involvement: Secondary | ICD-10-CM | POA: Diagnosis not present

## 2017-08-08 DIAGNOSIS — M159 Polyosteoarthritis, unspecified: Secondary | ICD-10-CM | POA: Diagnosis not present

## 2017-08-08 DIAGNOSIS — L299 Pruritus, unspecified: Secondary | ICD-10-CM | POA: Diagnosis not present

## 2017-08-08 DIAGNOSIS — K219 Gastro-esophageal reflux disease without esophagitis: Secondary | ICD-10-CM | POA: Diagnosis not present

## 2017-08-24 DIAGNOSIS — K219 Gastro-esophageal reflux disease without esophagitis: Secondary | ICD-10-CM | POA: Diagnosis not present

## 2017-08-24 DIAGNOSIS — R300 Dysuria: Secondary | ICD-10-CM | POA: Diagnosis not present

## 2017-08-24 DIAGNOSIS — J309 Allergic rhinitis, unspecified: Secondary | ICD-10-CM | POA: Diagnosis not present

## 2017-08-24 DIAGNOSIS — R2681 Unsteadiness on feet: Secondary | ICD-10-CM | POA: Diagnosis not present

## 2017-08-24 DIAGNOSIS — K580 Irritable bowel syndrome with diarrhea: Secondary | ICD-10-CM | POA: Diagnosis not present

## 2017-08-24 DIAGNOSIS — I10 Essential (primary) hypertension: Secondary | ICD-10-CM | POA: Diagnosis not present

## 2017-08-24 DIAGNOSIS — R600 Localized edema: Secondary | ICD-10-CM | POA: Diagnosis not present

## 2017-08-24 DIAGNOSIS — N182 Chronic kidney disease, stage 2 (mild): Secondary | ICD-10-CM | POA: Diagnosis not present

## 2017-08-24 DIAGNOSIS — L299 Pruritus, unspecified: Secondary | ICD-10-CM | POA: Diagnosis not present

## 2017-08-24 DIAGNOSIS — B0229 Other postherpetic nervous system involvement: Secondary | ICD-10-CM | POA: Diagnosis not present

## 2017-08-24 DIAGNOSIS — M810 Age-related osteoporosis without current pathological fracture: Secondary | ICD-10-CM | POA: Diagnosis not present

## 2017-08-24 DIAGNOSIS — E871 Hypo-osmolality and hyponatremia: Secondary | ICD-10-CM | POA: Diagnosis not present

## 2017-08-25 DIAGNOSIS — M79652 Pain in left thigh: Secondary | ICD-10-CM | POA: Diagnosis not present

## 2017-09-05 DIAGNOSIS — G4700 Insomnia, unspecified: Secondary | ICD-10-CM | POA: Diagnosis not present

## 2017-09-05 DIAGNOSIS — J309 Allergic rhinitis, unspecified: Secondary | ICD-10-CM | POA: Diagnosis not present

## 2017-09-05 DIAGNOSIS — M159 Polyosteoarthritis, unspecified: Secondary | ICD-10-CM | POA: Diagnosis not present

## 2017-09-05 DIAGNOSIS — E871 Hypo-osmolality and hyponatremia: Secondary | ICD-10-CM | POA: Diagnosis not present

## 2017-09-05 DIAGNOSIS — R600 Localized edema: Secondary | ICD-10-CM | POA: Diagnosis not present

## 2017-09-05 DIAGNOSIS — B0229 Other postherpetic nervous system involvement: Secondary | ICD-10-CM | POA: Diagnosis not present

## 2017-09-05 DIAGNOSIS — M810 Age-related osteoporosis without current pathological fracture: Secondary | ICD-10-CM | POA: Diagnosis not present

## 2017-09-05 DIAGNOSIS — R252 Cramp and spasm: Secondary | ICD-10-CM | POA: Diagnosis not present

## 2017-09-05 DIAGNOSIS — R3989 Other symptoms and signs involving the genitourinary system: Secondary | ICD-10-CM | POA: Diagnosis not present

## 2017-09-05 DIAGNOSIS — N182 Chronic kidney disease, stage 2 (mild): Secondary | ICD-10-CM | POA: Diagnosis not present

## 2017-09-05 DIAGNOSIS — L299 Pruritus, unspecified: Secondary | ICD-10-CM | POA: Diagnosis not present

## 2017-09-05 DIAGNOSIS — K219 Gastro-esophageal reflux disease without esophagitis: Secondary | ICD-10-CM | POA: Diagnosis not present

## 2017-09-07 DIAGNOSIS — M5136 Other intervertebral disc degeneration, lumbar region: Secondary | ICD-10-CM | POA: Diagnosis not present

## 2017-09-19 DIAGNOSIS — R2681 Unsteadiness on feet: Secondary | ICD-10-CM | POA: Diagnosis not present

## 2017-09-19 DIAGNOSIS — N182 Chronic kidney disease, stage 2 (mild): Secondary | ICD-10-CM | POA: Diagnosis not present

## 2017-09-19 DIAGNOSIS — R600 Localized edema: Secondary | ICD-10-CM | POA: Diagnosis not present

## 2017-09-19 DIAGNOSIS — K580 Irritable bowel syndrome with diarrhea: Secondary | ICD-10-CM | POA: Diagnosis not present

## 2017-09-19 DIAGNOSIS — L299 Pruritus, unspecified: Secondary | ICD-10-CM | POA: Diagnosis not present

## 2017-09-19 DIAGNOSIS — E871 Hypo-osmolality and hyponatremia: Secondary | ICD-10-CM | POA: Diagnosis not present

## 2017-09-19 DIAGNOSIS — B0229 Other postherpetic nervous system involvement: Secondary | ICD-10-CM | POA: Diagnosis not present

## 2017-09-19 DIAGNOSIS — K219 Gastro-esophageal reflux disease without esophagitis: Secondary | ICD-10-CM | POA: Diagnosis not present

## 2017-09-19 DIAGNOSIS — J309 Allergic rhinitis, unspecified: Secondary | ICD-10-CM | POA: Diagnosis not present

## 2017-09-19 DIAGNOSIS — C50919 Malignant neoplasm of unspecified site of unspecified female breast: Secondary | ICD-10-CM | POA: Diagnosis not present

## 2017-09-19 DIAGNOSIS — I10 Essential (primary) hypertension: Secondary | ICD-10-CM | POA: Diagnosis not present

## 2017-09-19 DIAGNOSIS — M810 Age-related osteoporosis without current pathological fracture: Secondary | ICD-10-CM | POA: Diagnosis not present

## 2017-09-22 DIAGNOSIS — M5136 Other intervertebral disc degeneration, lumbar region: Secondary | ICD-10-CM | POA: Diagnosis not present

## 2017-10-03 DIAGNOSIS — I10 Essential (primary) hypertension: Secondary | ICD-10-CM | POA: Diagnosis not present

## 2017-10-03 DIAGNOSIS — M810 Age-related osteoporosis without current pathological fracture: Secondary | ICD-10-CM | POA: Diagnosis not present

## 2017-10-03 DIAGNOSIS — R600 Localized edema: Secondary | ICD-10-CM | POA: Diagnosis not present

## 2017-10-03 DIAGNOSIS — E871 Hypo-osmolality and hyponatremia: Secondary | ICD-10-CM | POA: Diagnosis not present

## 2017-10-03 DIAGNOSIS — N182 Chronic kidney disease, stage 2 (mild): Secondary | ICD-10-CM | POA: Diagnosis not present

## 2017-10-03 DIAGNOSIS — E7800 Pure hypercholesterolemia, unspecified: Secondary | ICD-10-CM | POA: Diagnosis not present

## 2017-10-03 DIAGNOSIS — B0229 Other postherpetic nervous system involvement: Secondary | ICD-10-CM | POA: Diagnosis not present

## 2017-10-03 DIAGNOSIS — R2681 Unsteadiness on feet: Secondary | ICD-10-CM | POA: Diagnosis not present

## 2017-10-03 DIAGNOSIS — J309 Allergic rhinitis, unspecified: Secondary | ICD-10-CM | POA: Diagnosis not present

## 2017-10-03 DIAGNOSIS — K219 Gastro-esophageal reflux disease without esophagitis: Secondary | ICD-10-CM | POA: Diagnosis not present

## 2017-10-03 DIAGNOSIS — L299 Pruritus, unspecified: Secondary | ICD-10-CM | POA: Diagnosis not present

## 2017-10-03 DIAGNOSIS — K580 Irritable bowel syndrome with diarrhea: Secondary | ICD-10-CM | POA: Diagnosis not present

## 2017-10-17 DIAGNOSIS — L299 Pruritus, unspecified: Secondary | ICD-10-CM | POA: Diagnosis not present

## 2017-10-17 DIAGNOSIS — M810 Age-related osteoporosis without current pathological fracture: Secondary | ICD-10-CM | POA: Diagnosis not present

## 2017-10-17 DIAGNOSIS — B0229 Other postherpetic nervous system involvement: Secondary | ICD-10-CM | POA: Diagnosis not present

## 2017-10-17 DIAGNOSIS — J309 Allergic rhinitis, unspecified: Secondary | ICD-10-CM | POA: Diagnosis not present

## 2017-10-17 DIAGNOSIS — E871 Hypo-osmolality and hyponatremia: Secondary | ICD-10-CM | POA: Diagnosis not present

## 2017-10-17 DIAGNOSIS — E7800 Pure hypercholesterolemia, unspecified: Secondary | ICD-10-CM | POA: Diagnosis not present

## 2017-10-17 DIAGNOSIS — R600 Localized edema: Secondary | ICD-10-CM | POA: Diagnosis not present

## 2017-10-17 DIAGNOSIS — K580 Irritable bowel syndrome with diarrhea: Secondary | ICD-10-CM | POA: Diagnosis not present

## 2017-10-17 DIAGNOSIS — N182 Chronic kidney disease, stage 2 (mild): Secondary | ICD-10-CM | POA: Diagnosis not present

## 2017-10-17 DIAGNOSIS — K219 Gastro-esophageal reflux disease without esophagitis: Secondary | ICD-10-CM | POA: Diagnosis not present

## 2017-10-17 DIAGNOSIS — I10 Essential (primary) hypertension: Secondary | ICD-10-CM | POA: Diagnosis not present

## 2017-10-17 DIAGNOSIS — R2681 Unsteadiness on feet: Secondary | ICD-10-CM | POA: Diagnosis not present

## 2017-10-18 DIAGNOSIS — H43813 Vitreous degeneration, bilateral: Secondary | ICD-10-CM | POA: Diagnosis not present

## 2017-10-18 DIAGNOSIS — H353211 Exudative age-related macular degeneration, right eye, with active choroidal neovascularization: Secondary | ICD-10-CM | POA: Diagnosis not present

## 2017-10-18 DIAGNOSIS — H353212 Exudative age-related macular degeneration, right eye, with inactive choroidal neovascularization: Secondary | ICD-10-CM | POA: Diagnosis not present

## 2017-10-18 DIAGNOSIS — H353132 Nonexudative age-related macular degeneration, bilateral, intermediate dry stage: Secondary | ICD-10-CM | POA: Diagnosis not present

## 2017-11-03 DIAGNOSIS — L299 Pruritus, unspecified: Secondary | ICD-10-CM | POA: Diagnosis not present

## 2017-11-03 DIAGNOSIS — K580 Irritable bowel syndrome with diarrhea: Secondary | ICD-10-CM | POA: Diagnosis not present

## 2017-11-03 DIAGNOSIS — I10 Essential (primary) hypertension: Secondary | ICD-10-CM | POA: Diagnosis not present

## 2017-11-03 DIAGNOSIS — J309 Allergic rhinitis, unspecified: Secondary | ICD-10-CM | POA: Diagnosis not present

## 2017-11-03 DIAGNOSIS — R2681 Unsteadiness on feet: Secondary | ICD-10-CM | POA: Diagnosis not present

## 2017-11-03 DIAGNOSIS — N182 Chronic kidney disease, stage 2 (mild): Secondary | ICD-10-CM | POA: Diagnosis not present

## 2017-11-03 DIAGNOSIS — E871 Hypo-osmolality and hyponatremia: Secondary | ICD-10-CM | POA: Diagnosis not present

## 2017-11-03 DIAGNOSIS — B0229 Other postherpetic nervous system involvement: Secondary | ICD-10-CM | POA: Diagnosis not present

## 2017-11-03 DIAGNOSIS — R600 Localized edema: Secondary | ICD-10-CM | POA: Diagnosis not present

## 2017-11-03 DIAGNOSIS — E7800 Pure hypercholesterolemia, unspecified: Secondary | ICD-10-CM | POA: Diagnosis not present

## 2017-11-03 DIAGNOSIS — K219 Gastro-esophageal reflux disease without esophagitis: Secondary | ICD-10-CM | POA: Diagnosis not present

## 2017-11-03 DIAGNOSIS — M810 Age-related osteoporosis without current pathological fracture: Secondary | ICD-10-CM | POA: Diagnosis not present

## 2017-11-03 DIAGNOSIS — I749 Embolism and thrombosis of unspecified artery: Secondary | ICD-10-CM | POA: Diagnosis not present

## 2017-11-29 DIAGNOSIS — H353211 Exudative age-related macular degeneration, right eye, with active choroidal neovascularization: Secondary | ICD-10-CM | POA: Diagnosis not present

## 2017-12-01 DIAGNOSIS — R2681 Unsteadiness on feet: Secondary | ICD-10-CM | POA: Diagnosis not present

## 2017-12-01 DIAGNOSIS — J309 Allergic rhinitis, unspecified: Secondary | ICD-10-CM | POA: Diagnosis not present

## 2017-12-01 DIAGNOSIS — R600 Localized edema: Secondary | ICD-10-CM | POA: Diagnosis not present

## 2017-12-01 DIAGNOSIS — I749 Embolism and thrombosis of unspecified artery: Secondary | ICD-10-CM | POA: Diagnosis not present

## 2017-12-01 DIAGNOSIS — M810 Age-related osteoporosis without current pathological fracture: Secondary | ICD-10-CM | POA: Diagnosis not present

## 2017-12-01 DIAGNOSIS — L299 Pruritus, unspecified: Secondary | ICD-10-CM | POA: Diagnosis not present

## 2017-12-01 DIAGNOSIS — Z1339 Encounter for screening examination for other mental health and behavioral disorders: Secondary | ICD-10-CM | POA: Diagnosis not present

## 2017-12-01 DIAGNOSIS — E871 Hypo-osmolality and hyponatremia: Secondary | ICD-10-CM | POA: Diagnosis not present

## 2017-12-01 DIAGNOSIS — N182 Chronic kidney disease, stage 2 (mild): Secondary | ICD-10-CM | POA: Diagnosis not present

## 2017-12-01 DIAGNOSIS — K219 Gastro-esophageal reflux disease without esophagitis: Secondary | ICD-10-CM | POA: Diagnosis not present

## 2017-12-01 DIAGNOSIS — B0229 Other postherpetic nervous system involvement: Secondary | ICD-10-CM | POA: Diagnosis not present

## 2017-12-19 DIAGNOSIS — H353132 Nonexudative age-related macular degeneration, bilateral, intermediate dry stage: Secondary | ICD-10-CM | POA: Diagnosis not present

## 2017-12-19 DIAGNOSIS — H43813 Vitreous degeneration, bilateral: Secondary | ICD-10-CM | POA: Diagnosis not present

## 2017-12-19 DIAGNOSIS — H353212 Exudative age-related macular degeneration, right eye, with inactive choroidal neovascularization: Secondary | ICD-10-CM | POA: Diagnosis not present

## 2017-12-19 DIAGNOSIS — H353211 Exudative age-related macular degeneration, right eye, with active choroidal neovascularization: Secondary | ICD-10-CM | POA: Diagnosis not present

## 2017-12-29 DIAGNOSIS — N183 Chronic kidney disease, stage 3 (moderate): Secondary | ICD-10-CM | POA: Diagnosis not present

## 2017-12-29 DIAGNOSIS — L299 Pruritus, unspecified: Secondary | ICD-10-CM | POA: Diagnosis not present

## 2017-12-29 DIAGNOSIS — R2681 Unsteadiness on feet: Secondary | ICD-10-CM | POA: Diagnosis not present

## 2017-12-29 DIAGNOSIS — J309 Allergic rhinitis, unspecified: Secondary | ICD-10-CM | POA: Diagnosis not present

## 2017-12-29 DIAGNOSIS — M810 Age-related osteoporosis without current pathological fracture: Secondary | ICD-10-CM | POA: Diagnosis not present

## 2017-12-29 DIAGNOSIS — E871 Hypo-osmolality and hyponatremia: Secondary | ICD-10-CM | POA: Diagnosis not present

## 2017-12-29 DIAGNOSIS — R600 Localized edema: Secondary | ICD-10-CM | POA: Diagnosis not present

## 2017-12-29 DIAGNOSIS — I749 Embolism and thrombosis of unspecified artery: Secondary | ICD-10-CM | POA: Diagnosis not present

## 2017-12-29 DIAGNOSIS — Z23 Encounter for immunization: Secondary | ICD-10-CM | POA: Diagnosis not present

## 2017-12-29 DIAGNOSIS — B0229 Other postherpetic nervous system involvement: Secondary | ICD-10-CM | POA: Diagnosis not present

## 2017-12-29 DIAGNOSIS — Z1339 Encounter for screening examination for other mental health and behavioral disorders: Secondary | ICD-10-CM | POA: Diagnosis not present

## 2017-12-29 DIAGNOSIS — K219 Gastro-esophageal reflux disease without esophagitis: Secondary | ICD-10-CM | POA: Diagnosis not present

## 2018-01-02 DIAGNOSIS — H353211 Exudative age-related macular degeneration, right eye, with active choroidal neovascularization: Secondary | ICD-10-CM | POA: Diagnosis not present

## 2018-01-02 DIAGNOSIS — H353212 Exudative age-related macular degeneration, right eye, with inactive choroidal neovascularization: Secondary | ICD-10-CM | POA: Diagnosis not present

## 2018-01-02 DIAGNOSIS — H43813 Vitreous degeneration, bilateral: Secondary | ICD-10-CM | POA: Diagnosis not present

## 2018-01-02 DIAGNOSIS — H353132 Nonexudative age-related macular degeneration, bilateral, intermediate dry stage: Secondary | ICD-10-CM | POA: Diagnosis not present

## 2018-01-26 DIAGNOSIS — N183 Chronic kidney disease, stage 3 (moderate): Secondary | ICD-10-CM | POA: Diagnosis not present

## 2018-01-26 DIAGNOSIS — K580 Irritable bowel syndrome with diarrhea: Secondary | ICD-10-CM | POA: Diagnosis not present

## 2018-01-26 DIAGNOSIS — L299 Pruritus, unspecified: Secondary | ICD-10-CM | POA: Diagnosis not present

## 2018-01-26 DIAGNOSIS — R600 Localized edema: Secondary | ICD-10-CM | POA: Diagnosis not present

## 2018-01-26 DIAGNOSIS — R252 Cramp and spasm: Secondary | ICD-10-CM | POA: Diagnosis not present

## 2018-01-26 DIAGNOSIS — E871 Hypo-osmolality and hyponatremia: Secondary | ICD-10-CM | POA: Diagnosis not present

## 2018-01-26 DIAGNOSIS — G4700 Insomnia, unspecified: Secondary | ICD-10-CM | POA: Diagnosis not present

## 2018-01-26 DIAGNOSIS — R2681 Unsteadiness on feet: Secondary | ICD-10-CM | POA: Diagnosis not present

## 2018-01-26 DIAGNOSIS — K219 Gastro-esophageal reflux disease without esophagitis: Secondary | ICD-10-CM | POA: Diagnosis not present

## 2018-01-26 DIAGNOSIS — B0229 Other postherpetic nervous system involvement: Secondary | ICD-10-CM | POA: Diagnosis not present

## 2018-01-26 DIAGNOSIS — M810 Age-related osteoporosis without current pathological fracture: Secondary | ICD-10-CM | POA: Diagnosis not present

## 2018-01-26 DIAGNOSIS — J309 Allergic rhinitis, unspecified: Secondary | ICD-10-CM | POA: Diagnosis not present

## 2018-02-07 DIAGNOSIS — H353211 Exudative age-related macular degeneration, right eye, with active choroidal neovascularization: Secondary | ICD-10-CM | POA: Diagnosis not present

## 2018-02-28 DIAGNOSIS — J309 Allergic rhinitis, unspecified: Secondary | ICD-10-CM | POA: Diagnosis not present

## 2018-02-28 DIAGNOSIS — R2681 Unsteadiness on feet: Secondary | ICD-10-CM | POA: Diagnosis not present

## 2018-02-28 DIAGNOSIS — R252 Cramp and spasm: Secondary | ICD-10-CM | POA: Diagnosis not present

## 2018-02-28 DIAGNOSIS — M810 Age-related osteoporosis without current pathological fracture: Secondary | ICD-10-CM | POA: Diagnosis not present

## 2018-02-28 DIAGNOSIS — B0229 Other postherpetic nervous system involvement: Secondary | ICD-10-CM | POA: Diagnosis not present

## 2018-02-28 DIAGNOSIS — R600 Localized edema: Secondary | ICD-10-CM | POA: Diagnosis not present

## 2018-02-28 DIAGNOSIS — K219 Gastro-esophageal reflux disease without esophagitis: Secondary | ICD-10-CM | POA: Diagnosis not present

## 2018-02-28 DIAGNOSIS — K580 Irritable bowel syndrome with diarrhea: Secondary | ICD-10-CM | POA: Diagnosis not present

## 2018-02-28 DIAGNOSIS — E871 Hypo-osmolality and hyponatremia: Secondary | ICD-10-CM | POA: Diagnosis not present

## 2018-02-28 DIAGNOSIS — N183 Chronic kidney disease, stage 3 (moderate): Secondary | ICD-10-CM | POA: Diagnosis not present

## 2018-02-28 DIAGNOSIS — L299 Pruritus, unspecified: Secondary | ICD-10-CM | POA: Diagnosis not present

## 2018-02-28 DIAGNOSIS — G4700 Insomnia, unspecified: Secondary | ICD-10-CM | POA: Diagnosis not present

## 2018-03-21 DIAGNOSIS — H353212 Exudative age-related macular degeneration, right eye, with inactive choroidal neovascularization: Secondary | ICD-10-CM | POA: Diagnosis not present

## 2018-03-21 DIAGNOSIS — H353132 Nonexudative age-related macular degeneration, bilateral, intermediate dry stage: Secondary | ICD-10-CM | POA: Diagnosis not present

## 2018-03-21 DIAGNOSIS — H43813 Vitreous degeneration, bilateral: Secondary | ICD-10-CM | POA: Diagnosis not present

## 2018-03-21 DIAGNOSIS — H353211 Exudative age-related macular degeneration, right eye, with active choroidal neovascularization: Secondary | ICD-10-CM | POA: Diagnosis not present

## 2018-03-30 DIAGNOSIS — E871 Hypo-osmolality and hyponatremia: Secondary | ICD-10-CM | POA: Diagnosis not present

## 2018-03-30 DIAGNOSIS — R252 Cramp and spasm: Secondary | ICD-10-CM | POA: Diagnosis not present

## 2018-03-30 DIAGNOSIS — N183 Chronic kidney disease, stage 3 (moderate): Secondary | ICD-10-CM | POA: Diagnosis not present

## 2018-03-30 DIAGNOSIS — R2681 Unsteadiness on feet: Secondary | ICD-10-CM | POA: Diagnosis not present

## 2018-03-30 DIAGNOSIS — I10 Essential (primary) hypertension: Secondary | ICD-10-CM | POA: Diagnosis not present

## 2018-03-30 DIAGNOSIS — M810 Age-related osteoporosis without current pathological fracture: Secondary | ICD-10-CM | POA: Diagnosis not present

## 2018-03-30 DIAGNOSIS — L299 Pruritus, unspecified: Secondary | ICD-10-CM | POA: Diagnosis not present

## 2018-03-30 DIAGNOSIS — K580 Irritable bowel syndrome with diarrhea: Secondary | ICD-10-CM | POA: Diagnosis not present

## 2018-03-30 DIAGNOSIS — G4700 Insomnia, unspecified: Secondary | ICD-10-CM | POA: Diagnosis not present

## 2018-03-30 DIAGNOSIS — K219 Gastro-esophageal reflux disease without esophagitis: Secondary | ICD-10-CM | POA: Diagnosis not present

## 2018-03-30 DIAGNOSIS — E7800 Pure hypercholesterolemia, unspecified: Secondary | ICD-10-CM | POA: Diagnosis not present

## 2018-03-30 DIAGNOSIS — R600 Localized edema: Secondary | ICD-10-CM | POA: Diagnosis not present

## 2018-03-30 DIAGNOSIS — B0229 Other postherpetic nervous system involvement: Secondary | ICD-10-CM | POA: Diagnosis not present

## 2018-03-30 DIAGNOSIS — J309 Allergic rhinitis, unspecified: Secondary | ICD-10-CM | POA: Diagnosis not present

## 2018-04-11 DIAGNOSIS — R600 Localized edema: Secondary | ICD-10-CM | POA: Diagnosis not present

## 2018-04-11 DIAGNOSIS — M810 Age-related osteoporosis without current pathological fracture: Secondary | ICD-10-CM | POA: Diagnosis not present

## 2018-04-11 DIAGNOSIS — B0229 Other postherpetic nervous system involvement: Secondary | ICD-10-CM | POA: Diagnosis not present

## 2018-04-11 DIAGNOSIS — L299 Pruritus, unspecified: Secondary | ICD-10-CM | POA: Diagnosis not present

## 2018-04-11 DIAGNOSIS — N183 Chronic kidney disease, stage 3 (moderate): Secondary | ICD-10-CM | POA: Diagnosis not present

## 2018-04-11 DIAGNOSIS — K580 Irritable bowel syndrome with diarrhea: Secondary | ICD-10-CM | POA: Diagnosis not present

## 2018-04-11 DIAGNOSIS — E871 Hypo-osmolality and hyponatremia: Secondary | ICD-10-CM | POA: Diagnosis not present

## 2018-04-11 DIAGNOSIS — R2681 Unsteadiness on feet: Secondary | ICD-10-CM | POA: Diagnosis not present

## 2018-04-11 DIAGNOSIS — J309 Allergic rhinitis, unspecified: Secondary | ICD-10-CM | POA: Diagnosis not present

## 2018-04-11 DIAGNOSIS — I749 Embolism and thrombosis of unspecified artery: Secondary | ICD-10-CM | POA: Diagnosis not present

## 2018-04-11 DIAGNOSIS — R110 Nausea: Secondary | ICD-10-CM | POA: Diagnosis not present

## 2018-04-11 DIAGNOSIS — K219 Gastro-esophageal reflux disease without esophagitis: Secondary | ICD-10-CM | POA: Diagnosis not present

## 2018-04-27 DIAGNOSIS — R600 Localized edema: Secondary | ICD-10-CM | POA: Diagnosis not present

## 2018-04-27 DIAGNOSIS — I749 Embolism and thrombosis of unspecified artery: Secondary | ICD-10-CM | POA: Diagnosis not present

## 2018-04-27 DIAGNOSIS — B0229 Other postherpetic nervous system involvement: Secondary | ICD-10-CM | POA: Diagnosis not present

## 2018-04-27 DIAGNOSIS — K219 Gastro-esophageal reflux disease without esophagitis: Secondary | ICD-10-CM | POA: Diagnosis not present

## 2018-04-27 DIAGNOSIS — N183 Chronic kidney disease, stage 3 (moderate): Secondary | ICD-10-CM | POA: Diagnosis not present

## 2018-04-27 DIAGNOSIS — J309 Allergic rhinitis, unspecified: Secondary | ICD-10-CM | POA: Diagnosis not present

## 2018-04-27 DIAGNOSIS — K580 Irritable bowel syndrome with diarrhea: Secondary | ICD-10-CM | POA: Diagnosis not present

## 2018-04-27 DIAGNOSIS — R2681 Unsteadiness on feet: Secondary | ICD-10-CM | POA: Diagnosis not present

## 2018-04-27 DIAGNOSIS — L299 Pruritus, unspecified: Secondary | ICD-10-CM | POA: Diagnosis not present

## 2018-04-27 DIAGNOSIS — E871 Hypo-osmolality and hyponatremia: Secondary | ICD-10-CM | POA: Diagnosis not present

## 2018-04-27 DIAGNOSIS — M810 Age-related osteoporosis without current pathological fracture: Secondary | ICD-10-CM | POA: Diagnosis not present

## 2018-05-02 DIAGNOSIS — H353211 Exudative age-related macular degeneration, right eye, with active choroidal neovascularization: Secondary | ICD-10-CM | POA: Diagnosis not present

## 2018-05-02 DIAGNOSIS — H353132 Nonexudative age-related macular degeneration, bilateral, intermediate dry stage: Secondary | ICD-10-CM | POA: Diagnosis not present

## 2018-05-02 DIAGNOSIS — H353212 Exudative age-related macular degeneration, right eye, with inactive choroidal neovascularization: Secondary | ICD-10-CM | POA: Diagnosis not present

## 2018-05-02 DIAGNOSIS — H43813 Vitreous degeneration, bilateral: Secondary | ICD-10-CM | POA: Diagnosis not present

## 2018-05-25 DIAGNOSIS — R2681 Unsteadiness on feet: Secondary | ICD-10-CM | POA: Diagnosis not present

## 2018-05-25 DIAGNOSIS — E871 Hypo-osmolality and hyponatremia: Secondary | ICD-10-CM | POA: Diagnosis not present

## 2018-05-25 DIAGNOSIS — M810 Age-related osteoporosis without current pathological fracture: Secondary | ICD-10-CM | POA: Diagnosis not present

## 2018-05-25 DIAGNOSIS — M25552 Pain in left hip: Secondary | ICD-10-CM | POA: Diagnosis not present

## 2018-05-25 DIAGNOSIS — B0229 Other postherpetic nervous system involvement: Secondary | ICD-10-CM | POA: Diagnosis not present

## 2018-05-25 DIAGNOSIS — K580 Irritable bowel syndrome with diarrhea: Secondary | ICD-10-CM | POA: Diagnosis not present

## 2018-05-25 DIAGNOSIS — G4700 Insomnia, unspecified: Secondary | ICD-10-CM | POA: Diagnosis not present

## 2018-05-25 DIAGNOSIS — R600 Localized edema: Secondary | ICD-10-CM | POA: Diagnosis not present

## 2018-05-25 DIAGNOSIS — R5382 Chronic fatigue, unspecified: Secondary | ICD-10-CM | POA: Diagnosis not present

## 2018-05-25 DIAGNOSIS — N183 Chronic kidney disease, stage 3 (moderate): Secondary | ICD-10-CM | POA: Diagnosis not present

## 2018-05-25 DIAGNOSIS — K219 Gastro-esophageal reflux disease without esophagitis: Secondary | ICD-10-CM | POA: Diagnosis not present

## 2018-05-25 DIAGNOSIS — L299 Pruritus, unspecified: Secondary | ICD-10-CM | POA: Diagnosis not present

## 2018-05-25 DIAGNOSIS — J309 Allergic rhinitis, unspecified: Secondary | ICD-10-CM | POA: Diagnosis not present

## 2018-06-05 DIAGNOSIS — D649 Anemia, unspecified: Secondary | ICD-10-CM | POA: Diagnosis not present

## 2018-06-05 DIAGNOSIS — I10 Essential (primary) hypertension: Secondary | ICD-10-CM | POA: Diagnosis not present

## 2018-06-05 DIAGNOSIS — B0229 Other postherpetic nervous system involvement: Secondary | ICD-10-CM | POA: Diagnosis not present

## 2018-06-05 DIAGNOSIS — K580 Irritable bowel syndrome with diarrhea: Secondary | ICD-10-CM | POA: Diagnosis not present

## 2018-06-05 DIAGNOSIS — R600 Localized edema: Secondary | ICD-10-CM | POA: Diagnosis not present

## 2018-06-05 DIAGNOSIS — K219 Gastro-esophageal reflux disease without esophagitis: Secondary | ICD-10-CM | POA: Diagnosis not present

## 2018-06-05 DIAGNOSIS — E871 Hypo-osmolality and hyponatremia: Secondary | ICD-10-CM | POA: Diagnosis not present

## 2018-06-05 DIAGNOSIS — L299 Pruritus, unspecified: Secondary | ICD-10-CM | POA: Diagnosis not present

## 2018-06-05 DIAGNOSIS — N184 Chronic kidney disease, stage 4 (severe): Secondary | ICD-10-CM | POA: Diagnosis not present

## 2018-06-05 DIAGNOSIS — J309 Allergic rhinitis, unspecified: Secondary | ICD-10-CM | POA: Diagnosis not present

## 2018-06-05 DIAGNOSIS — M810 Age-related osteoporosis without current pathological fracture: Secondary | ICD-10-CM | POA: Diagnosis not present

## 2018-06-05 DIAGNOSIS — R2681 Unsteadiness on feet: Secondary | ICD-10-CM | POA: Diagnosis not present

## 2018-06-19 DIAGNOSIS — F411 Generalized anxiety disorder: Secondary | ICD-10-CM | POA: Diagnosis not present

## 2018-06-19 DIAGNOSIS — E871 Hypo-osmolality and hyponatremia: Secondary | ICD-10-CM | POA: Diagnosis not present

## 2018-06-19 DIAGNOSIS — J309 Allergic rhinitis, unspecified: Secondary | ICD-10-CM | POA: Diagnosis not present

## 2018-06-19 DIAGNOSIS — M810 Age-related osteoporosis without current pathological fracture: Secondary | ICD-10-CM | POA: Diagnosis not present

## 2018-06-19 DIAGNOSIS — J439 Emphysema, unspecified: Secondary | ICD-10-CM | POA: Diagnosis not present

## 2018-06-19 DIAGNOSIS — L299 Pruritus, unspecified: Secondary | ICD-10-CM | POA: Diagnosis not present

## 2018-06-19 DIAGNOSIS — I509 Heart failure, unspecified: Secondary | ICD-10-CM | POA: Diagnosis not present

## 2018-06-19 DIAGNOSIS — B0229 Other postherpetic nervous system involvement: Secondary | ICD-10-CM | POA: Diagnosis not present

## 2018-06-19 DIAGNOSIS — K5900 Constipation, unspecified: Secondary | ICD-10-CM | POA: Diagnosis not present

## 2018-06-19 DIAGNOSIS — K219 Gastro-esophageal reflux disease without esophagitis: Secondary | ICD-10-CM | POA: Diagnosis not present

## 2018-06-19 DIAGNOSIS — R2681 Unsteadiness on feet: Secondary | ICD-10-CM | POA: Diagnosis not present

## 2018-06-19 DIAGNOSIS — K580 Irritable bowel syndrome with diarrhea: Secondary | ICD-10-CM | POA: Diagnosis not present

## 2018-06-19 DIAGNOSIS — Z6825 Body mass index (BMI) 25.0-25.9, adult: Secondary | ICD-10-CM | POA: Diagnosis not present

## 2018-06-19 DIAGNOSIS — I10 Essential (primary) hypertension: Secondary | ICD-10-CM | POA: Diagnosis not present

## 2018-06-19 DIAGNOSIS — R600 Localized edema: Secondary | ICD-10-CM | POA: Diagnosis not present

## 2018-06-20 DIAGNOSIS — H353132 Nonexudative age-related macular degeneration, bilateral, intermediate dry stage: Secondary | ICD-10-CM | POA: Diagnosis not present

## 2018-06-20 DIAGNOSIS — C44722 Squamous cell carcinoma of skin of right lower limb, including hip: Secondary | ICD-10-CM | POA: Diagnosis not present

## 2018-06-20 DIAGNOSIS — H353211 Exudative age-related macular degeneration, right eye, with active choroidal neovascularization: Secondary | ICD-10-CM | POA: Diagnosis not present

## 2018-06-20 DIAGNOSIS — D0471 Carcinoma in situ of skin of right lower limb, including hip: Secondary | ICD-10-CM | POA: Diagnosis not present

## 2018-06-20 DIAGNOSIS — Z23 Encounter for immunization: Secondary | ICD-10-CM | POA: Diagnosis not present

## 2018-06-20 DIAGNOSIS — G4733 Obstructive sleep apnea (adult) (pediatric): Secondary | ICD-10-CM | POA: Diagnosis not present

## 2018-06-20 DIAGNOSIS — H43813 Vitreous degeneration, bilateral: Secondary | ICD-10-CM | POA: Diagnosis not present

## 2018-06-21 DIAGNOSIS — M25562 Pain in left knee: Secondary | ICD-10-CM | POA: Diagnosis not present

## 2018-06-21 DIAGNOSIS — R600 Localized edema: Secondary | ICD-10-CM | POA: Diagnosis not present

## 2018-06-21 DIAGNOSIS — K219 Gastro-esophageal reflux disease without esophagitis: Secondary | ICD-10-CM | POA: Diagnosis not present

## 2018-06-21 DIAGNOSIS — B0229 Other postherpetic nervous system involvement: Secondary | ICD-10-CM | POA: Diagnosis not present

## 2018-06-21 DIAGNOSIS — M810 Age-related osteoporosis without current pathological fracture: Secondary | ICD-10-CM | POA: Diagnosis not present

## 2018-06-21 DIAGNOSIS — R2681 Unsteadiness on feet: Secondary | ICD-10-CM | POA: Diagnosis not present

## 2018-06-21 DIAGNOSIS — E871 Hypo-osmolality and hyponatremia: Secondary | ICD-10-CM | POA: Diagnosis not present

## 2018-06-21 DIAGNOSIS — E7800 Pure hypercholesterolemia, unspecified: Secondary | ICD-10-CM | POA: Diagnosis not present

## 2018-06-21 DIAGNOSIS — L299 Pruritus, unspecified: Secondary | ICD-10-CM | POA: Diagnosis not present

## 2018-06-21 DIAGNOSIS — K580 Irritable bowel syndrome with diarrhea: Secondary | ICD-10-CM | POA: Diagnosis not present

## 2018-06-21 DIAGNOSIS — G500 Trigeminal neuralgia: Secondary | ICD-10-CM | POA: Diagnosis not present

## 2018-06-21 DIAGNOSIS — I10 Essential (primary) hypertension: Secondary | ICD-10-CM | POA: Diagnosis not present

## 2018-06-21 DIAGNOSIS — J309 Allergic rhinitis, unspecified: Secondary | ICD-10-CM | POA: Diagnosis not present

## 2018-06-29 DIAGNOSIS — Z6825 Body mass index (BMI) 25.0-25.9, adult: Secondary | ICD-10-CM | POA: Diagnosis not present

## 2018-06-29 DIAGNOSIS — R600 Localized edema: Secondary | ICD-10-CM | POA: Diagnosis not present

## 2018-06-29 DIAGNOSIS — B0229 Other postherpetic nervous system involvement: Secondary | ICD-10-CM | POA: Diagnosis not present

## 2018-06-29 DIAGNOSIS — M810 Age-related osteoporosis without current pathological fracture: Secondary | ICD-10-CM | POA: Diagnosis not present

## 2018-06-29 DIAGNOSIS — K219 Gastro-esophageal reflux disease without esophagitis: Secondary | ICD-10-CM | POA: Diagnosis not present

## 2018-06-29 DIAGNOSIS — J309 Allergic rhinitis, unspecified: Secondary | ICD-10-CM | POA: Diagnosis not present

## 2018-06-29 DIAGNOSIS — M25562 Pain in left knee: Secondary | ICD-10-CM | POA: Diagnosis not present

## 2018-06-29 DIAGNOSIS — R2681 Unsteadiness on feet: Secondary | ICD-10-CM | POA: Diagnosis not present

## 2018-06-29 DIAGNOSIS — L299 Pruritus, unspecified: Secondary | ICD-10-CM | POA: Diagnosis not present

## 2018-07-20 DIAGNOSIS — K219 Gastro-esophageal reflux disease without esophagitis: Secondary | ICD-10-CM | POA: Diagnosis not present

## 2018-07-20 DIAGNOSIS — E871 Hypo-osmolality and hyponatremia: Secondary | ICD-10-CM | POA: Diagnosis not present

## 2018-07-20 DIAGNOSIS — E7800 Pure hypercholesterolemia, unspecified: Secondary | ICD-10-CM | POA: Diagnosis not present

## 2018-07-20 DIAGNOSIS — K580 Irritable bowel syndrome with diarrhea: Secondary | ICD-10-CM | POA: Diagnosis not present

## 2018-07-20 DIAGNOSIS — B0229 Other postherpetic nervous system involvement: Secondary | ICD-10-CM | POA: Diagnosis not present

## 2018-07-20 DIAGNOSIS — J309 Allergic rhinitis, unspecified: Secondary | ICD-10-CM | POA: Diagnosis not present

## 2018-07-20 DIAGNOSIS — L299 Pruritus, unspecified: Secondary | ICD-10-CM | POA: Diagnosis not present

## 2018-07-20 DIAGNOSIS — R2681 Unsteadiness on feet: Secondary | ICD-10-CM | POA: Diagnosis not present

## 2018-07-20 DIAGNOSIS — I749 Embolism and thrombosis of unspecified artery: Secondary | ICD-10-CM | POA: Diagnosis not present

## 2018-07-20 DIAGNOSIS — M810 Age-related osteoporosis without current pathological fracture: Secondary | ICD-10-CM | POA: Diagnosis not present

## 2018-07-20 DIAGNOSIS — R600 Localized edema: Secondary | ICD-10-CM | POA: Diagnosis not present

## 2018-07-20 DIAGNOSIS — I10 Essential (primary) hypertension: Secondary | ICD-10-CM | POA: Diagnosis not present

## 2018-07-26 DIAGNOSIS — R600 Localized edema: Secondary | ICD-10-CM | POA: Diagnosis not present

## 2018-07-26 DIAGNOSIS — I129 Hypertensive chronic kidney disease with stage 1 through stage 4 chronic kidney disease, or unspecified chronic kidney disease: Secondary | ICD-10-CM | POA: Diagnosis not present

## 2018-07-26 DIAGNOSIS — D631 Anemia in chronic kidney disease: Secondary | ICD-10-CM | POA: Diagnosis not present

## 2018-07-26 DIAGNOSIS — N184 Chronic kidney disease, stage 4 (severe): Secondary | ICD-10-CM | POA: Diagnosis not present

## 2018-07-26 DIAGNOSIS — N189 Chronic kidney disease, unspecified: Secondary | ICD-10-CM | POA: Diagnosis not present

## 2018-07-26 DIAGNOSIS — E559 Vitamin D deficiency, unspecified: Secondary | ICD-10-CM | POA: Diagnosis not present

## 2018-08-02 DIAGNOSIS — H43813 Vitreous degeneration, bilateral: Secondary | ICD-10-CM | POA: Diagnosis not present

## 2018-08-02 DIAGNOSIS — H353132 Nonexudative age-related macular degeneration, bilateral, intermediate dry stage: Secondary | ICD-10-CM | POA: Diagnosis not present

## 2018-08-02 DIAGNOSIS — H353211 Exudative age-related macular degeneration, right eye, with active choroidal neovascularization: Secondary | ICD-10-CM | POA: Diagnosis not present

## 2018-08-14 DIAGNOSIS — N184 Chronic kidney disease, stage 4 (severe): Secondary | ICD-10-CM | POA: Diagnosis not present

## 2018-08-17 DIAGNOSIS — L299 Pruritus, unspecified: Secondary | ICD-10-CM | POA: Diagnosis not present

## 2018-08-17 DIAGNOSIS — I10 Essential (primary) hypertension: Secondary | ICD-10-CM | POA: Diagnosis not present

## 2018-08-17 DIAGNOSIS — J309 Allergic rhinitis, unspecified: Secondary | ICD-10-CM | POA: Diagnosis not present

## 2018-08-17 DIAGNOSIS — Z6825 Body mass index (BMI) 25.0-25.9, adult: Secondary | ICD-10-CM | POA: Diagnosis not present

## 2018-08-17 DIAGNOSIS — R600 Localized edema: Secondary | ICD-10-CM | POA: Diagnosis not present

## 2018-08-17 DIAGNOSIS — K219 Gastro-esophageal reflux disease without esophagitis: Secondary | ICD-10-CM | POA: Diagnosis not present

## 2018-08-17 DIAGNOSIS — E871 Hypo-osmolality and hyponatremia: Secondary | ICD-10-CM | POA: Diagnosis not present

## 2018-08-17 DIAGNOSIS — E663 Overweight: Secondary | ICD-10-CM | POA: Diagnosis not present

## 2018-08-17 DIAGNOSIS — R2681 Unsteadiness on feet: Secondary | ICD-10-CM | POA: Diagnosis not present

## 2018-08-17 DIAGNOSIS — M810 Age-related osteoporosis without current pathological fracture: Secondary | ICD-10-CM | POA: Diagnosis not present

## 2018-08-17 DIAGNOSIS — B0229 Other postherpetic nervous system involvement: Secondary | ICD-10-CM | POA: Diagnosis not present

## 2018-08-17 DIAGNOSIS — K580 Irritable bowel syndrome with diarrhea: Secondary | ICD-10-CM | POA: Diagnosis not present

## 2018-08-21 DIAGNOSIS — N184 Chronic kidney disease, stage 4 (severe): Secondary | ICD-10-CM | POA: Diagnosis not present

## 2018-08-30 DIAGNOSIS — M810 Age-related osteoporosis without current pathological fracture: Secondary | ICD-10-CM | POA: Diagnosis not present

## 2018-08-30 DIAGNOSIS — R2681 Unsteadiness on feet: Secondary | ICD-10-CM | POA: Diagnosis not present

## 2018-08-30 DIAGNOSIS — E7800 Pure hypercholesterolemia, unspecified: Secondary | ICD-10-CM | POA: Diagnosis not present

## 2018-08-30 DIAGNOSIS — L299 Pruritus, unspecified: Secondary | ICD-10-CM | POA: Diagnosis not present

## 2018-08-30 DIAGNOSIS — K580 Irritable bowel syndrome with diarrhea: Secondary | ICD-10-CM | POA: Diagnosis not present

## 2018-08-30 DIAGNOSIS — B0229 Other postherpetic nervous system involvement: Secondary | ICD-10-CM | POA: Diagnosis not present

## 2018-08-30 DIAGNOSIS — E871 Hypo-osmolality and hyponatremia: Secondary | ICD-10-CM | POA: Diagnosis not present

## 2018-08-30 DIAGNOSIS — M159 Polyosteoarthritis, unspecified: Secondary | ICD-10-CM | POA: Diagnosis not present

## 2018-08-30 DIAGNOSIS — I10 Essential (primary) hypertension: Secondary | ICD-10-CM | POA: Diagnosis not present

## 2018-08-30 DIAGNOSIS — R600 Localized edema: Secondary | ICD-10-CM | POA: Diagnosis not present

## 2018-08-30 DIAGNOSIS — J309 Allergic rhinitis, unspecified: Secondary | ICD-10-CM | POA: Diagnosis not present

## 2018-08-30 DIAGNOSIS — K219 Gastro-esophageal reflux disease without esophagitis: Secondary | ICD-10-CM | POA: Diagnosis not present

## 2018-09-06 DIAGNOSIS — L299 Pruritus, unspecified: Secondary | ICD-10-CM | POA: Diagnosis not present

## 2018-09-06 DIAGNOSIS — E871 Hypo-osmolality and hyponatremia: Secondary | ICD-10-CM | POA: Diagnosis not present

## 2018-09-06 DIAGNOSIS — Z6825 Body mass index (BMI) 25.0-25.9, adult: Secondary | ICD-10-CM | POA: Diagnosis not present

## 2018-09-06 DIAGNOSIS — K219 Gastro-esophageal reflux disease without esophagitis: Secondary | ICD-10-CM | POA: Diagnosis not present

## 2018-09-06 DIAGNOSIS — R600 Localized edema: Secondary | ICD-10-CM | POA: Diagnosis not present

## 2018-09-06 DIAGNOSIS — J309 Allergic rhinitis, unspecified: Secondary | ICD-10-CM | POA: Diagnosis not present

## 2018-09-06 DIAGNOSIS — H353211 Exudative age-related macular degeneration, right eye, with active choroidal neovascularization: Secondary | ICD-10-CM | POA: Diagnosis not present

## 2018-09-06 DIAGNOSIS — M810 Age-related osteoporosis without current pathological fracture: Secondary | ICD-10-CM | POA: Diagnosis not present

## 2018-09-06 DIAGNOSIS — E663 Overweight: Secondary | ICD-10-CM | POA: Diagnosis not present

## 2018-09-06 DIAGNOSIS — J0100 Acute maxillary sinusitis, unspecified: Secondary | ICD-10-CM | POA: Diagnosis not present

## 2018-09-06 DIAGNOSIS — R2681 Unsteadiness on feet: Secondary | ICD-10-CM | POA: Diagnosis not present

## 2018-09-06 DIAGNOSIS — K580 Irritable bowel syndrome with diarrhea: Secondary | ICD-10-CM | POA: Diagnosis not present

## 2018-09-06 DIAGNOSIS — B0229 Other postherpetic nervous system involvement: Secondary | ICD-10-CM | POA: Diagnosis not present

## 2018-09-18 DIAGNOSIS — K219 Gastro-esophageal reflux disease without esophagitis: Secondary | ICD-10-CM | POA: Diagnosis not present

## 2018-09-18 DIAGNOSIS — B0229 Other postherpetic nervous system involvement: Secondary | ICD-10-CM | POA: Diagnosis not present

## 2018-09-18 DIAGNOSIS — K580 Irritable bowel syndrome with diarrhea: Secondary | ICD-10-CM | POA: Diagnosis not present

## 2018-09-18 DIAGNOSIS — R2681 Unsteadiness on feet: Secondary | ICD-10-CM | POA: Diagnosis not present

## 2018-09-18 DIAGNOSIS — R29898 Other symptoms and signs involving the musculoskeletal system: Secondary | ICD-10-CM | POA: Diagnosis not present

## 2018-09-18 DIAGNOSIS — E785 Hyperlipidemia, unspecified: Secondary | ICD-10-CM | POA: Diagnosis not present

## 2018-09-18 DIAGNOSIS — Z139 Encounter for screening, unspecified: Secondary | ICD-10-CM | POA: Diagnosis not present

## 2018-09-18 DIAGNOSIS — Z0000 Encounter for general adult medical examination without abnormal findings: Secondary | ICD-10-CM | POA: Diagnosis not present

## 2018-09-18 DIAGNOSIS — J309 Allergic rhinitis, unspecified: Secondary | ICD-10-CM | POA: Diagnosis not present

## 2018-09-18 DIAGNOSIS — Z1331 Encounter for screening for depression: Secondary | ICD-10-CM | POA: Diagnosis not present

## 2018-09-18 DIAGNOSIS — R300 Dysuria: Secondary | ICD-10-CM | POA: Diagnosis not present

## 2018-09-18 DIAGNOSIS — E7800 Pure hypercholesterolemia, unspecified: Secondary | ICD-10-CM | POA: Diagnosis not present

## 2018-09-18 DIAGNOSIS — L299 Pruritus, unspecified: Secondary | ICD-10-CM | POA: Diagnosis not present

## 2018-09-18 DIAGNOSIS — Z9181 History of falling: Secondary | ICD-10-CM | POA: Diagnosis not present

## 2018-09-18 DIAGNOSIS — I10 Essential (primary) hypertension: Secondary | ICD-10-CM | POA: Diagnosis not present

## 2018-09-18 DIAGNOSIS — M810 Age-related osteoporosis without current pathological fracture: Secondary | ICD-10-CM | POA: Diagnosis not present

## 2018-09-18 DIAGNOSIS — E871 Hypo-osmolality and hyponatremia: Secondary | ICD-10-CM | POA: Diagnosis not present

## 2018-09-20 DIAGNOSIS — M6281 Muscle weakness (generalized): Secondary | ICD-10-CM | POA: Diagnosis not present

## 2018-09-20 DIAGNOSIS — E871 Hypo-osmolality and hyponatremia: Secondary | ICD-10-CM | POA: Diagnosis not present

## 2018-09-20 DIAGNOSIS — G4700 Insomnia, unspecified: Secondary | ICD-10-CM | POA: Diagnosis not present

## 2018-09-20 DIAGNOSIS — Z791 Long term (current) use of non-steroidal anti-inflammatories (NSAID): Secondary | ICD-10-CM | POA: Diagnosis not present

## 2018-09-20 DIAGNOSIS — K589 Irritable bowel syndrome without diarrhea: Secondary | ICD-10-CM | POA: Diagnosis not present

## 2018-09-20 DIAGNOSIS — R2681 Unsteadiness on feet: Secondary | ICD-10-CM | POA: Diagnosis not present

## 2018-09-20 DIAGNOSIS — Z7952 Long term (current) use of systemic steroids: Secondary | ICD-10-CM | POA: Diagnosis not present

## 2018-09-20 DIAGNOSIS — M159 Polyosteoarthritis, unspecified: Secondary | ICD-10-CM | POA: Diagnosis not present

## 2018-09-20 DIAGNOSIS — K5900 Constipation, unspecified: Secondary | ICD-10-CM | POA: Diagnosis not present

## 2018-09-20 DIAGNOSIS — Z853 Personal history of malignant neoplasm of breast: Secondary | ICD-10-CM | POA: Diagnosis not present

## 2018-09-20 DIAGNOSIS — R29898 Other symptoms and signs involving the musculoskeletal system: Secondary | ICD-10-CM | POA: Diagnosis not present

## 2018-09-20 DIAGNOSIS — B0229 Other postherpetic nervous system involvement: Secondary | ICD-10-CM | POA: Diagnosis not present

## 2018-09-20 DIAGNOSIS — J309 Allergic rhinitis, unspecified: Secondary | ICD-10-CM | POA: Diagnosis not present

## 2018-09-20 DIAGNOSIS — R252 Cramp and spasm: Secondary | ICD-10-CM | POA: Diagnosis not present

## 2018-09-20 DIAGNOSIS — E785 Hyperlipidemia, unspecified: Secondary | ICD-10-CM | POA: Diagnosis not present

## 2018-09-20 DIAGNOSIS — E7800 Pure hypercholesterolemia, unspecified: Secondary | ICD-10-CM | POA: Diagnosis not present

## 2018-09-20 DIAGNOSIS — Z6825 Body mass index (BMI) 25.0-25.9, adult: Secondary | ICD-10-CM | POA: Diagnosis not present

## 2018-09-20 DIAGNOSIS — E663 Overweight: Secondary | ICD-10-CM | POA: Diagnosis not present

## 2018-09-20 DIAGNOSIS — N184 Chronic kidney disease, stage 4 (severe): Secondary | ICD-10-CM | POA: Diagnosis not present

## 2018-09-20 DIAGNOSIS — Z9181 History of falling: Secondary | ICD-10-CM | POA: Diagnosis not present

## 2018-09-20 DIAGNOSIS — Z79899 Other long term (current) drug therapy: Secondary | ICD-10-CM | POA: Diagnosis not present

## 2018-09-20 DIAGNOSIS — K219 Gastro-esophageal reflux disease without esophagitis: Secondary | ICD-10-CM | POA: Diagnosis not present

## 2018-09-20 DIAGNOSIS — D649 Anemia, unspecified: Secondary | ICD-10-CM | POA: Diagnosis not present

## 2018-09-20 DIAGNOSIS — I129 Hypertensive chronic kidney disease with stage 1 through stage 4 chronic kidney disease, or unspecified chronic kidney disease: Secondary | ICD-10-CM | POA: Diagnosis not present

## 2018-09-20 DIAGNOSIS — M810 Age-related osteoporosis without current pathological fracture: Secondary | ICD-10-CM | POA: Diagnosis not present

## 2018-10-02 DIAGNOSIS — R2681 Unsteadiness on feet: Secondary | ICD-10-CM | POA: Diagnosis not present

## 2018-10-02 DIAGNOSIS — E7800 Pure hypercholesterolemia, unspecified: Secondary | ICD-10-CM | POA: Diagnosis not present

## 2018-10-02 DIAGNOSIS — L299 Pruritus, unspecified: Secondary | ICD-10-CM | POA: Diagnosis not present

## 2018-10-02 DIAGNOSIS — R29898 Other symptoms and signs involving the musculoskeletal system: Secondary | ICD-10-CM | POA: Diagnosis not present

## 2018-10-02 DIAGNOSIS — B0229 Other postherpetic nervous system involvement: Secondary | ICD-10-CM | POA: Diagnosis not present

## 2018-10-02 DIAGNOSIS — E871 Hypo-osmolality and hyponatremia: Secondary | ICD-10-CM | POA: Diagnosis not present

## 2018-10-02 DIAGNOSIS — K219 Gastro-esophageal reflux disease without esophagitis: Secondary | ICD-10-CM | POA: Diagnosis not present

## 2018-10-02 DIAGNOSIS — M810 Age-related osteoporosis without current pathological fracture: Secondary | ICD-10-CM | POA: Diagnosis not present

## 2018-10-02 DIAGNOSIS — K580 Irritable bowel syndrome with diarrhea: Secondary | ICD-10-CM | POA: Diagnosis not present

## 2018-10-02 DIAGNOSIS — I10 Essential (primary) hypertension: Secondary | ICD-10-CM | POA: Diagnosis not present

## 2018-10-02 DIAGNOSIS — J309 Allergic rhinitis, unspecified: Secondary | ICD-10-CM | POA: Diagnosis not present

## 2018-10-02 DIAGNOSIS — R600 Localized edema: Secondary | ICD-10-CM | POA: Diagnosis not present

## 2018-10-10 DIAGNOSIS — H353132 Nonexudative age-related macular degeneration, bilateral, intermediate dry stage: Secondary | ICD-10-CM | POA: Diagnosis not present

## 2018-10-10 DIAGNOSIS — H43813 Vitreous degeneration, bilateral: Secondary | ICD-10-CM | POA: Diagnosis not present

## 2018-10-10 DIAGNOSIS — H353211 Exudative age-related macular degeneration, right eye, with active choroidal neovascularization: Secondary | ICD-10-CM | POA: Diagnosis not present

## 2018-10-20 DIAGNOSIS — D649 Anemia, unspecified: Secondary | ICD-10-CM | POA: Diagnosis not present

## 2018-10-20 DIAGNOSIS — M810 Age-related osteoporosis without current pathological fracture: Secondary | ICD-10-CM | POA: Diagnosis not present

## 2018-10-20 DIAGNOSIS — K219 Gastro-esophageal reflux disease without esophagitis: Secondary | ICD-10-CM | POA: Diagnosis not present

## 2018-10-20 DIAGNOSIS — I129 Hypertensive chronic kidney disease with stage 1 through stage 4 chronic kidney disease, or unspecified chronic kidney disease: Secondary | ICD-10-CM | POA: Diagnosis not present

## 2018-10-20 DIAGNOSIS — K589 Irritable bowel syndrome without diarrhea: Secondary | ICD-10-CM | POA: Diagnosis not present

## 2018-10-20 DIAGNOSIS — R29898 Other symptoms and signs involving the musculoskeletal system: Secondary | ICD-10-CM | POA: Diagnosis not present

## 2018-10-20 DIAGNOSIS — R252 Cramp and spasm: Secondary | ICD-10-CM | POA: Diagnosis not present

## 2018-10-20 DIAGNOSIS — E7800 Pure hypercholesterolemia, unspecified: Secondary | ICD-10-CM | POA: Diagnosis not present

## 2018-10-20 DIAGNOSIS — R2681 Unsteadiness on feet: Secondary | ICD-10-CM | POA: Diagnosis not present

## 2018-10-20 DIAGNOSIS — K5900 Constipation, unspecified: Secondary | ICD-10-CM | POA: Diagnosis not present

## 2018-10-20 DIAGNOSIS — G4700 Insomnia, unspecified: Secondary | ICD-10-CM | POA: Diagnosis not present

## 2018-10-20 DIAGNOSIS — E663 Overweight: Secondary | ICD-10-CM | POA: Diagnosis not present

## 2018-10-20 DIAGNOSIS — Z9181 History of falling: Secondary | ICD-10-CM | POA: Diagnosis not present

## 2018-10-20 DIAGNOSIS — M159 Polyosteoarthritis, unspecified: Secondary | ICD-10-CM | POA: Diagnosis not present

## 2018-10-20 DIAGNOSIS — Z853 Personal history of malignant neoplasm of breast: Secondary | ICD-10-CM | POA: Diagnosis not present

## 2018-10-20 DIAGNOSIS — Z7952 Long term (current) use of systemic steroids: Secondary | ICD-10-CM | POA: Diagnosis not present

## 2018-10-20 DIAGNOSIS — M6281 Muscle weakness (generalized): Secondary | ICD-10-CM | POA: Diagnosis not present

## 2018-10-20 DIAGNOSIS — Z79899 Other long term (current) drug therapy: Secondary | ICD-10-CM | POA: Diagnosis not present

## 2018-10-20 DIAGNOSIS — B0229 Other postherpetic nervous system involvement: Secondary | ICD-10-CM | POA: Diagnosis not present

## 2018-10-20 DIAGNOSIS — Z6825 Body mass index (BMI) 25.0-25.9, adult: Secondary | ICD-10-CM | POA: Diagnosis not present

## 2018-10-20 DIAGNOSIS — N184 Chronic kidney disease, stage 4 (severe): Secondary | ICD-10-CM | POA: Diagnosis not present

## 2018-10-20 DIAGNOSIS — E871 Hypo-osmolality and hyponatremia: Secondary | ICD-10-CM | POA: Diagnosis not present

## 2018-10-20 DIAGNOSIS — J309 Allergic rhinitis, unspecified: Secondary | ICD-10-CM | POA: Diagnosis not present

## 2018-10-20 DIAGNOSIS — E785 Hyperlipidemia, unspecified: Secondary | ICD-10-CM | POA: Diagnosis not present

## 2018-10-20 DIAGNOSIS — Z791 Long term (current) use of non-steroidal anti-inflammatories (NSAID): Secondary | ICD-10-CM | POA: Diagnosis not present

## 2018-11-01 ENCOUNTER — Encounter

## 2018-11-01 ENCOUNTER — Encounter: Payer: Self-pay | Admitting: Neurology

## 2018-11-01 ENCOUNTER — Ambulatory Visit: Payer: PPO | Admitting: Neurology

## 2018-11-01 ENCOUNTER — Other Ambulatory Visit: Payer: Self-pay

## 2018-11-01 ENCOUNTER — Telehealth: Payer: Self-pay | Admitting: Neurology

## 2018-11-01 VITALS — BP 149/73 | HR 58 | Temp 98.1°F

## 2018-11-01 DIAGNOSIS — R269 Unspecified abnormalities of gait and mobility: Secondary | ICD-10-CM

## 2018-11-01 DIAGNOSIS — G3281 Cerebellar ataxia in diseases classified elsewhere: Secondary | ICD-10-CM | POA: Diagnosis not present

## 2018-11-01 DIAGNOSIS — R531 Weakness: Secondary | ICD-10-CM

## 2018-11-01 NOTE — Telephone Encounter (Signed)
health team order sent to GI. No auth they will reach out to the patient to schedule.  

## 2018-11-01 NOTE — Progress Notes (Signed)
PATIENT: Victoria Moran DOB: [birth year]  Chief Complaint  Patient presents with  . Extremity Weakness    She is here with her husband, Victoria Moran.  Reports gradual onset of muscle weakness in her legs.  Symptoms are causing her difficulty when rising from a seated position and walking.  She has also developed weakness in her arms.   Victoria Moran PCP    Victoria Nakai, MD     [place] is a 83 year old female, seen in request by her primary care doctor Victoria Moran for evaluation of extremity weakness, she is accompanied by her husband Victoria Moran, and daughter at today's clinical visit  I have reviewed and summarized the referring note from the referring physician.  She had past medical history of breast cancer, status post bilateral mastectomy, hypertension, anemia, chronic kidney disease, arthritis, chronic joint pain, has been taking prednisone 5 mg daily for few years  She suffered intractable right hip bursitis with gluteal tendon tear, bursectomy was gluteal tendon repair right side in July 2015, similar procedure for left side in January 2017, postoperatively, she has gradual onset gait abnormality, has been rely on her walker.  She suffered a fall in March 2020, getting out of the bathroom, fell face down, with black eyes, bruise in her ribs, needing help getting up, ever since that fall, she was noted to have increased gait abnormality, she complains of increased bilateral upper extremity weakness, previous left shoulder rotator cuff disease, now increased limited range of motion of left shoulder, also noticed increased bilateral lower extremity weakness, she denies bowel and bladder incontinence, she denies double vision, bulbar weakness, she does complains of diffuse body achy pain.  REVIEW OF SYSTEMS: Full 14 system review of systems performed and notable only for as above All other review of systems were negative.  ALLERGIES: Allergies  Allergen Reactions  . Cephalosporins  Diarrhea  . Codeine Nausea Only    HOME MEDICATIONS: Current Outpatient Medications  Medication Sig Dispense Refill  . Bilberry 1000 MG CAPS Take 1 capsule by mouth daily.    . Calcium Carbonate-Vitamin D (CALTRATE 600+D PO) Take 1 tablet by mouth daily.    Victoria Moran acyclovir (ZOVIRAX) 800 MG tablet TAKE ONE TABLET BY MOUTH FIVE TIMES DAILY FOR 10 DAYS  0  . amLODipine (NORVASC) 5 MG tablet Take 5 mg by mouth every morning.     Victoria Moran anastrozole (ARIMIDEX) 1 MG tablet Take 1 mg by mouth every morning.     . cholecalciferol (VITAMIN D) 1000 UNITS tablet Take 1,000 Units by mouth daily.    . clonazePAM (KLONOPIN) 1 MG tablet Take 1 mg by mouth every 8 (eight) hours as needed.  0  . clorazepate (TRANXENE) 7.5 MG tablet Take 7.5 mg by mouth at bedtime.     . cyclobenzaprine (FLEXERIL) 10 MG tablet Take 10 mg by mouth at bedtime.     . docusate sodium (COLACE) 100 MG capsule Take 100 mg by mouth daily.     . furosemide (LASIX) 20 MG tablet Take 20 mg by mouth daily.  0  . gabapentin (NEURONTIN) 400 MG capsule Take 400 mg by mouth at bedtime.  5  . metoprolol (LOPRESSOR) 50 MG tablet Take 75 mg by mouth 2 (two) times daily.     . Multiple Vitamins-Minerals (MACULAR VITAMIN BENEFIT) TABS Take 4 tablets by mouth daily.     . nabumetone (RELAFEN) 750 MG tablet Take 750 mg by mouth 2 (two) times daily as needed.  10  . predniSONE (DELTASONE) 5 MG tablet Take 5 mg by mouth every morning.      No current facility-administered medications for this visit.     PAST MEDICAL HISTORY: Past Medical History:  Diagnosis Date  . Anemia   . Anxiety   . Arthritis   . Cancer ([place])    hx bilateral breast cancer  . Constipation   . Cystic mastopathy   . Edema of extremities   . Esophageal reflux   . 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  Hypercholesteremia   . Hypertension   . Hypertension   . Hyponatremia   . IBS (irritable bowel syndrome)   . Insomnia   . Insomnia   . Lumbago   . Macular degeneration of both eyes   . Muscle cramping   . Osteoarthritis   . Osteopenia   . Postherpetic neuralgia   . Pruritic disorder   . Stage 4 chronic kidney disease ([place])   . Swelling of both ankles   . Tendinopathy of left gluteus medius    hip  . Weakness     PAST SURGICAL HISTORY: Past Surgical History:  Procedure Laterality Date  . CATARACT EXTRACTION W/ INTRAOCULAR LENS  IMPLANT, BILATERAL    . DECOMPRESSIVE LUMBAR LAMINECTOMY LEVEL 2  03/08/2012   Procedure: DECOMPRESSIVE LUMBAR LAMINECTOMY LEVEL 2;  Surgeon: Melina Schools, MD;  Location: [facility];  Service: Orthopedics;  Laterality: N/A;  Decompression L3-L5   . ELBOW SURGERY Left 2003  . EXCISION/RELEASE BURSA HIP Left 03/18/2015   Procedure: LEFT HIP BURSECTOMY WITH GLUTEAL TENDON REPAIR;  Surgeon: Gaynelle Arabian, MD;  Location: [facility];  Service: Orthopedics;  Laterality: Left;  . KNEE ARTHROSCOPY Left   . LEFT FOOT SURGERY  01-09-2008   left great toe and left second toe  . MASTECTOMY W/ SENTINEL NODE BIOPSY Left 07/04/2012   Procedure: MASTECTOMY WITH SENTINEL LYMPH NODE BIOPSY;  Surgeon: Stark Klein, MD;  Location: [facility];  Service: General;  Laterality: Left;  . OPEN SURGICAL REPAIR OF GLUTEAL TENDON Right 09/27/2013   Procedure: RIGHT HIP BURSECTOMY/GLUTEAL TENDON REPAIR;  Surgeon: Gearlean Alf, MD;  Location: [facility];  Service: Orthopedics;  Laterality: Right;  . ROTATOR CUFF REPAIR Right ?2005  . TOTAL MASTECTOMY Right 08-08-2007   w/  SLN bx    FAMILY HISTORY: Family History  Problem Relation Age of Onset  . Heart attack Mother   . Hypertension Mother   . Congestive Heart Failure Father   . Pneumonia Father     SOCIAL HISTORY: Social History   Socioeconomic History  . Marital status: Married    Spouse name: Not on file  . Number  of children: 1  . Years of education: 87  . Highest education level: High school graduate  Occupational History  . Occupation: Retired  Scientific laboratory technician  . Financial resource strain: Not on file  . Food insecurity    Worry: Not on file    Inability: Not on file  . Transportation needs    Medical: Not on file    Non-medical: Not on file  Tobacco Use  . Smoking status: Never Smoker  . Smokeless tobacco: Never Used  Substance and Sexual Activity  . Alcohol use: No  . Drug use: No  .  Sexual activity: Not on file  Lifestyle  . Physical activity    Days per week: Not on file    Minutes per session: Not on file  . Stress: Not on file  Relationships  . Social Herbalist on phone: Not on file    Gets together: Not on file    Attends religious service: Not on file    Active member of club or organization: Not on file    Attends meetings of clubs or organizations: Not on file    Relationship status: Not on file  . Intimate partner violence    Fear of current or ex partner: Not on file    Emotionally abused: Not on file    Physically abused: Not on file    Forced sexual activity: Not on file  Other Topics Concern  . Not on file  Social History Narrative   Lives at home with her husband.   Right-handed.   No daily caffeine use.     PHYSICAL EXAM   Vitals:   11/01/18 1048  BP: (!) 149/73  Pulse: (!) 58  Temp: 98.1 F (36.7 C)    Not recorded      There is no height or weight on file to calculate BMI.  PHYSICAL EXAMNIATION:  Gen: NAD, conversant, well nourised, obese, well groomed                     Cardiovascular: Regular rate rhythm, no peripheral edema, warm, nontender. Eyes: Conjunctivae clear without exudates or hemorrhage Neck: Supple, no carotid bruits. Pulmonary: Clear to auscultation bilaterally   NEUROLOGICAL EXAM:  MENTAL STATUS: Speech:    Speech is normal; fluent and spontaneous with normal comprehension.  Cognition:     Orientation to  time, place and person     Normal recent and remote memory     Normal Attention span and concentration     Normal Language, naming, repeating,spontaneous speech     Fund of knowledge   CRANIAL NERVES: CN II: Visual fields are full to confrontation.   Pupils are round equal and briskly reactive to light. CN III, IV, VI: extraocular movement are normal. No ptosis. CN V: Facial sensation is intact to pinprick in all 3 divisions bilaterally. Corneal responses are intact.  CN VII: Face is symmetric with normal eye closure and smile. CN VIII: Hearing is normal to rubbing fingers CN IX, X: Palate elevates symmetrically. Phonation is normal. CN XI: Head turning and shoulder shrug are intact CN XII: Tongue is midline with normal movements and no atrophy.  MOTOR: Limited range of motion of left shoulder,  Mild bilateral shoulder abduction and external rotation weakness, no significant hand grip weakness, mild to moderate bilateral hip flexion, ankle dorsiflexion weakness  REFLEXES: Reflexes are 2+ and symmetric at the biceps, triceps, knees, and absent at ankles. Plantar responses are flexor.  SENSORY: Mildly decreased light touch, vibratory sensation at toes.  COORDINATION: Rapid alternating movements and fine finger movements are intact. There is no dysmetria on finger-to-nose and heel-knee-shin.    GAIT/STANCE: She needs assistance to get up from seated position, difficulty initiate gait,    DIAGNOSTIC DATA (LABS, IMAGING, TESTING) - I reviewed patient records, labs, notes, testing and imaging myself where available.   ASSESSMENT AND PLAN  CARALEE MOREA is a 83 y.o. female   Gait abnormalities Worsening weakness of upper and lower extremities following fall in March 2020.  Differentiation diagnosis include inflammatory myopathy, cervical spondylotic myelopathy,  lumbar stenosis  Proceed with MRI of cervical, lumbar spine  Laboratory evaluations.  EMG/NCS  Continue Home Rehab.    Marcial Pacas, M.D. Ph.D.  [facility] [address] [place], [place] [address] Ph: [phone] Fax: [phone]  CC: Victoria Nakai, MD

## 2018-11-04 LAB — CBC WITH DIFFERENTIAL
Basophils Absolute: 0.1 10*3/uL (ref 0.0–0.2)
Basos: 1 %
EOS (ABSOLUTE): 0.4 10*3/uL (ref 0.0–0.4)
Eos: 3 %
Hematocrit: 34.1 % (ref 34.0–46.6)
Hemoglobin: 11.4 g/dL (ref 11.1–15.9)
Immature Grans (Abs): 0.1 10*3/uL (ref 0.0–0.1)
Immature Granulocytes: 1 %
Lymphocytes Absolute: 2.4 10*3/uL (ref 0.7–3.1)
Lymphs: 23 %
MCH: 32.9 pg (ref 26.6–33.0)
MCHC: 33.4 g/dL (ref 31.5–35.7)
MCV: 99 fL — ABNORMAL HIGH (ref 79–97)
Monocytes Absolute: 1 10*3/uL — ABNORMAL HIGH (ref 0.1–0.9)
Monocytes: 9 %
Neutrophils Absolute: 6.5 10*3/uL (ref 1.4–7.0)
Neutrophils: 63 %
RBC: 3.46 x10E6/uL — ABNORMAL LOW (ref 3.77–5.28)
RDW: 12.1 % (ref 11.7–15.4)
WBC: 10.4 10*3/uL (ref 3.4–10.8)

## 2018-11-04 LAB — COMPREHENSIVE METABOLIC PANEL
ALT: 28 IU/L (ref 0–32)
AST: 31 IU/L (ref 0–40)
Albumin/Globulin Ratio: 1.7 (ref 1.2–2.2)
Albumin: 4.2 g/dL (ref 3.6–4.6)
Alkaline Phosphatase: 55 IU/L (ref 39–117)
BUN/Creatinine Ratio: 19 (ref 12–28)
BUN: 31 mg/dL — ABNORMAL HIGH (ref 8–27)
Bilirubin Total: 0.2 mg/dL (ref 0.0–1.2)
CO2: 24 mmol/L (ref 20–29)
Calcium: 9.5 mg/dL (ref 8.7–10.3)
Chloride: 101 mmol/L (ref 96–106)
Creatinine, Ser: 1.67 mg/dL — ABNORMAL HIGH (ref 0.57–1.00)
GFR calc Af Amer: 33 mL/min/{1.73_m2} — ABNORMAL LOW (ref >59)
GFR calc non Af Amer: 28 mL/min/{1.73_m2} — ABNORMAL LOW (ref >59)
Globulin, Total: 2.5 g/dL (ref 1.5–4.5)
Glucose: 94 mg/dL (ref 65–99)
Potassium: 4.2 mmol/L (ref 3.5–5.2)
Sodium: 142 mmol/L (ref 134–144)
Total Protein: 6.7 g/dL (ref 6.0–8.5)

## 2018-11-04 LAB — ACETYLCHOLINE RECEPTOR, BINDING: AChR Binding Ab, Serum: 0.03 nmol/L (ref 0.00–0.24)

## 2018-11-04 LAB — CK: Total CK: 46 U/L (ref 26–161)

## 2018-11-04 LAB — FOLATE: Folate: >20 ng/mL (ref >3.0)

## 2018-11-04 LAB — ANA W/REFLEX IF POSITIVE: Anti Nuclear Antibody (ANA): NEGATIVE

## 2018-11-04 LAB — SEDIMENTATION RATE: Sed Rate: 52 mm/hr — ABNORMAL HIGH (ref 0–40)

## 2018-11-04 LAB — HIV ANTIBODY (ROUTINE TESTING W REFLEX): HIV Screen 4th Generation wRfx: NONREACTIVE

## 2018-11-04 LAB — COPPER, SERUM: Copper: 118 ug/dL (ref 72–166)

## 2018-11-04 LAB — TSH: TSH: 1.41 u[IU]/mL (ref 0.450–4.500)

## 2018-11-04 LAB — FERRITIN: Ferritin: 85 ng/mL (ref 15–150)

## 2018-11-04 LAB — RPR: RPR Ser Ql: NONREACTIVE

## 2018-11-04 LAB — VITAMIN D 25 HYDROXY (VIT D DEFICIENCY, FRACTURES): Vit D, 25-Hydroxy: 34.3 ng/mL (ref 30.0–100.0)

## 2018-11-04 LAB — VITAMIN B12: Vitamin B-12: >2000 pg/mL — ABNORMAL HIGH (ref 232–1245)

## 2018-11-04 LAB — C-REACTIVE PROTEIN: CRP: 1 mg/L (ref 0–10)

## 2018-11-05 ENCOUNTER — Telehealth: Payer: Self-pay | Admitting: Neurology

## 2018-11-05 NOTE — Telephone Encounter (Signed)
I spoke to the patient and she is aware of her lab results.  She has pending appt with [facility] on 11/28/2018.

## 2018-11-05 NOTE — Telephone Encounter (Signed)
Please call patient, there was elevated ESR 52, CMP showed creatinine of 1.67, which is higher than previous evaluation in 2015, with decreased GFR of 28,  Rest of the laboratory evaluation showed no significant abnormality,  Please check to see if she has seen by her primary care or kidney specialist recently,

## 2018-11-13 DIAGNOSIS — L299 Pruritus, unspecified: Secondary | ICD-10-CM | POA: Diagnosis not present

## 2018-11-13 DIAGNOSIS — R29898 Other symptoms and signs involving the musculoskeletal system: Secondary | ICD-10-CM | POA: Diagnosis not present

## 2018-11-13 DIAGNOSIS — M810 Age-related osteoporosis without current pathological fracture: Secondary | ICD-10-CM | POA: Diagnosis not present

## 2018-11-13 DIAGNOSIS — E7800 Pure hypercholesterolemia, unspecified: Secondary | ICD-10-CM | POA: Diagnosis not present

## 2018-11-13 DIAGNOSIS — K219 Gastro-esophageal reflux disease without esophagitis: Secondary | ICD-10-CM | POA: Diagnosis not present

## 2018-11-13 DIAGNOSIS — R600 Localized edema: Secondary | ICD-10-CM | POA: Diagnosis not present

## 2018-11-13 DIAGNOSIS — J309 Allergic rhinitis, unspecified: Secondary | ICD-10-CM | POA: Diagnosis not present

## 2018-11-13 DIAGNOSIS — I10 Essential (primary) hypertension: Secondary | ICD-10-CM | POA: Diagnosis not present

## 2018-11-13 DIAGNOSIS — E871 Hypo-osmolality and hyponatremia: Secondary | ICD-10-CM | POA: Diagnosis not present

## 2018-11-13 DIAGNOSIS — K580 Irritable bowel syndrome with diarrhea: Secondary | ICD-10-CM | POA: Diagnosis not present

## 2018-11-13 DIAGNOSIS — B0229 Other postherpetic nervous system involvement: Secondary | ICD-10-CM | POA: Diagnosis not present

## 2018-11-13 DIAGNOSIS — R2681 Unsteadiness on feet: Secondary | ICD-10-CM | POA: Diagnosis not present

## 2018-11-14 DIAGNOSIS — H353132 Nonexudative age-related macular degeneration, bilateral, intermediate dry stage: Secondary | ICD-10-CM | POA: Diagnosis not present

## 2018-11-14 DIAGNOSIS — H353211 Exudative age-related macular degeneration, right eye, with active choroidal neovascularization: Secondary | ICD-10-CM | POA: Diagnosis not present

## 2018-11-14 DIAGNOSIS — H43813 Vitreous degeneration, bilateral: Secondary | ICD-10-CM | POA: Diagnosis not present

## 2018-11-26 DIAGNOSIS — I129 Hypertensive chronic kidney disease with stage 1 through stage 4 chronic kidney disease, or unspecified chronic kidney disease: Secondary | ICD-10-CM | POA: Diagnosis not present

## 2018-11-26 DIAGNOSIS — N184 Chronic kidney disease, stage 4 (severe): Secondary | ICD-10-CM | POA: Diagnosis not present

## 2018-11-26 DIAGNOSIS — Z23 Encounter for immunization: Secondary | ICD-10-CM | POA: Diagnosis not present

## 2018-11-26 DIAGNOSIS — R600 Localized edema: Secondary | ICD-10-CM | POA: Diagnosis not present

## 2018-11-26 DIAGNOSIS — N189 Chronic kidney disease, unspecified: Secondary | ICD-10-CM | POA: Diagnosis not present

## 2018-11-26 DIAGNOSIS — E559 Vitamin D deficiency, unspecified: Secondary | ICD-10-CM | POA: Diagnosis not present

## 2018-11-26 DIAGNOSIS — D631 Anemia in chronic kidney disease: Secondary | ICD-10-CM | POA: Diagnosis not present

## 2018-11-27 DIAGNOSIS — J309 Allergic rhinitis, unspecified: Secondary | ICD-10-CM | POA: Diagnosis not present

## 2018-11-27 DIAGNOSIS — E871 Hypo-osmolality and hyponatremia: Secondary | ICD-10-CM | POA: Diagnosis not present

## 2018-11-27 DIAGNOSIS — R600 Localized edema: Secondary | ICD-10-CM | POA: Diagnosis not present

## 2018-11-27 DIAGNOSIS — M810 Age-related osteoporosis without current pathological fracture: Secondary | ICD-10-CM | POA: Diagnosis not present

## 2018-11-27 DIAGNOSIS — K580 Irritable bowel syndrome with diarrhea: Secondary | ICD-10-CM | POA: Diagnosis not present

## 2018-11-27 DIAGNOSIS — R2681 Unsteadiness on feet: Secondary | ICD-10-CM | POA: Diagnosis not present

## 2018-11-27 DIAGNOSIS — L299 Pruritus, unspecified: Secondary | ICD-10-CM | POA: Diagnosis not present

## 2018-11-27 DIAGNOSIS — R29898 Other symptoms and signs involving the musculoskeletal system: Secondary | ICD-10-CM | POA: Diagnosis not present

## 2018-11-27 DIAGNOSIS — K219 Gastro-esophageal reflux disease without esophagitis: Secondary | ICD-10-CM | POA: Diagnosis not present

## 2018-11-27 DIAGNOSIS — Z6825 Body mass index (BMI) 25.0-25.9, adult: Secondary | ICD-10-CM | POA: Diagnosis not present

## 2018-11-27 DIAGNOSIS — E663 Overweight: Secondary | ICD-10-CM | POA: Diagnosis not present

## 2018-11-27 DIAGNOSIS — B0229 Other postherpetic nervous system involvement: Secondary | ICD-10-CM | POA: Diagnosis not present

## 2018-11-28 DIAGNOSIS — H353211 Exudative age-related macular degeneration, right eye, with active choroidal neovascularization: Secondary | ICD-10-CM | POA: Diagnosis not present

## 2018-11-29 ENCOUNTER — Ambulatory Visit
Admission: RE | Admit: 2018-11-29 | Discharge: 2018-11-29 | Disposition: A | Discharge status: Home / Self Care | Payer: PPO | Source: Ambulatory Visit | Attending: Neurology | Admitting: Neurology

## 2018-11-29 DIAGNOSIS — M48061 Spinal stenosis, lumbar region without neurogenic claudication: Secondary | ICD-10-CM | POA: Diagnosis not present

## 2018-11-29 DIAGNOSIS — G3281 Cerebellar ataxia in diseases classified elsewhere: Secondary | ICD-10-CM | POA: Diagnosis not present

## 2018-12-03 ENCOUNTER — Telehealth: Payer: Self-pay | Admitting: Neurology

## 2018-12-03 NOTE — Telephone Encounter (Signed)
I spoke to the patient and provided her with the results below.  She accepted an appt to further review with Dr. Krista Blue on 12/10/2018.

## 2018-12-03 NOTE — Telephone Encounter (Signed)
Please call patient, MRI of cervical spine showed multilevel degenerative changes, moderate spinal stenosis at C5 and 6, no evidence of spinal cord signal change  MRI of the lumbar spine showed retrolisthesis of L2-L3, probable left L3 nerve roots compression, evidence of bilateral laminectomy at L3-4, L4-5,  Mild to moderate spinal stenosis at L5-S1, severe right foraminal narrowing, potential right S1 nerve root compression  Scoliosis convex to the left upper lumbar region, convex to the right in the lower lumbar spine She has EMG nerve conduction study appointment scheduled on January 07, 2019, if she has gradual worsening upper and lower extremity weakness, gait abnormality, urinary incontinence, please bring her in as soon as possible to review the film, potentially refer her to neurosurgeon  IMPRESSION: This MRI of the cervical spine without contrast shows moderate to severe multilevel degenerative changes.  Most significant findings are:: 1.   The spinal cord appears normal. 2.   At C3-C4, there is mild spinal stenosis and arthrodesis.  There is bilateral foraminal narrowing that could affect the C4 nerve roots. 3.   At C4-C5, there is mild spinal stenosis but no nerve root compression. 4.   At C5-C6, there is moderate spinal stenosis but no definite nerve root compression. 5.   At C6-C7, there is mild spinal stenosis but no nerve root compression. IMPRESSION: This MRI of the lumbar spine shows the following: 1.   At L2-L3, there is retrolisthesis and other degenerative changes causing moderately severe left lateral recess stenosis with probable left L3 nerve root compression. 2.   At L3-L4, there have been bilateral laminectomies.  There is mild anterolisthesis and other degenerative changes but there does not appear to be nerve root compression or spinal stenosis. 3.   At L4-L5, there have been bilateral laminectomies.  There is anterolisthesis and other degenerative changes causing  severe left foraminal narrowing and bilateral moderately severe lateral recess stenosis.  There is potential for left L4 and bilateral L5 nerve root compression. 4.   At L5-S1, there is mild to moderate spinal stenosis due to degenerative changes that also cord is moderately severe right foraminal narrowing with potential for right S1 nerve root compression. 5.   Scoliosis convex to the left in the upper lumbar spine and convex to the right in the lower lumbar spine.

## 2018-12-03 NOTE — Telephone Encounter (Signed)
Open in error

## 2018-12-10 ENCOUNTER — Encounter: Payer: Self-pay | Admitting: Neurology

## 2018-12-10 ENCOUNTER — Ambulatory Visit: Payer: PPO | Admitting: Neurology

## 2018-12-10 ENCOUNTER — Other Ambulatory Visit: Payer: Self-pay

## 2018-12-10 VITALS — BP 141/72 | HR 57 | Temp 97.7°F

## 2018-12-10 DIAGNOSIS — R269 Unspecified abnormalities of gait and mobility: Secondary | ICD-10-CM

## 2018-12-10 DIAGNOSIS — M5416 Radiculopathy, lumbar region: Secondary | ICD-10-CM | POA: Diagnosis not present

## 2018-12-10 DIAGNOSIS — R531 Weakness: Secondary | ICD-10-CM | POA: Diagnosis not present

## 2018-12-10 NOTE — Telephone Encounter (Signed)
Pt is requesting her MRI results be mailed out to her home, and any other Medical Records that she has on file

## 2018-12-10 NOTE — Progress Notes (Signed)
PATIENT: Victoria Moran DOB: [birth year]  Chief Complaint  Patient presents with  . Extremity Weakness    She is here with her husband, [name] and daughter, Lynelle Smoke.  They would like to review her lumbar and cervical MRI findings.     HISTORICAL  Victoria Moran is a 83 year old female, seen in request by her primary care doctor [name] for evaluation of extremity weakness, she is accompanied by her husband [name], and daughter at today's clinical visit  I have reviewed and summarized the referring note from the referring physician.  She had past medical history of breast cancer, status post bilateral mastectomy, hypertension, anemia, chronic kidney disease, arthritis, chronic joint pain, has been taking prednisone 5 mg daily for few years  She suffered intractable right hip bursitis with gluteal tendon tear, bursectomy was gluteal tendon repair right side in July 2015, similar procedure for left side in January 2017, postoperatively, she has gradual onset gait abnormality, has been rely on her walker.  She suffered a fall in March 2020, getting out of the bathroom, fell face down, with black eyes, bruise in her ribs, needi help getting up, ever since that fall, she was noted to have increased gait abnormality, she complains of increased bilateral upper extremity weakness, previous left shoulder rotator cuff disease, now increased limited range of motion of left shoulder, also noticed increased bilateral lower extremity weakness, she denies bowel and bladder incontinence, she denies double vision, bulbar weakness, she does complains of diffuse body achy pain.  UPDATE Sept 30 2020: She came in with her husband and daughter Lynelle Smoke at today's clinical visit, continues to have significant gait abnormality.  We personally reviewed MRI in September 2020.  Multilevel degenerative changes, moderate canal stenosis at L5-S1, variable degree of foraminal narrowing, especially at L2-3, with potential  encroachment upon multiple descending left-sided nerve roots.  MRI of cervical spine, multiple degenerative changes, moderate spinal canal stenosis at C5-6, variable degree of foraminal narrowing, no evidence of nerve root compression  Laboratory evaluation August 2020, negative ANA, ESR was mildly elevated 52, creatinine 1.67, GFR of 28, normal or negative ferritin, vitamin D, acetylcholine receptor antibody, CPK, TSH, C-reactive protein, folic acid, HIV, RPR, D92, CBC, hemoglobin of 11.4,  REVIEW OF SYSTEMS: Full 14 system review of systems performed and notable only for as above All other review of systems were negative.  ALLERGIES: Allergies  Allergen Reactions  . Cephalosporins Diarrhea  . Codeine Nausea Only    HOME MEDICATIONS: Current Outpatient Medications  Medication Sig Dispense Refill  . amLODipine (NORVASC) 10 MG tablet Take 10 mg by mouth daily as needed.    . calcitRIOL (ROCALTROL) 0.25 MCG capsule Take 0.25 mcg by mouth daily.    . Calcium Carbonate-Vit D-Min (CALCIUM 600+D PLUS MINERALS PO) Take 1 tablet by mouth daily.    . clonazePAM (KLONOPIN) 1 MG tablet Take 1 mg by mouth every 8 (eight) hours as needed.  0  . clorazepate (TRANXENE) 7.5 MG tablet Take 7.5 mg by mouth at bedtime as needed for sleep.     . ferrous sulfate 325 (65 FE) MG EC tablet Take 325 mg by mouth daily.    . fluticasone (FLONASE) 50 MCG/ACT nasal spray Place 1 spray into both nostrils daily.    . furosemide (LASIX) 20 MG tablet Take 20 mg by mouth.    [name] lisinopril (ZESTRIL) 40 MG tablet Take 20 mg by mouth daily.    . LUTEIN PO Take 40 mg by mouth  daily.    . metoprolol tartrate (LOPRESSOR) 100 MG tablet Take 100 mg by mouth 2 (two) times daily.    . Multiple Vitamins-Minerals (MACULAR VITAMIN BENEFIT) TABS Take 4 tablets by mouth daily.     . nabumetone (RELAFEN) 750 MG tablet Take 750 mg by mouth 2 (two) times daily as needed.   10  . predniSONE (DELTASONE) 5 MG tablet Take 5 mg by mouth every  morning.     . Red Yeast Rice 600 MG TABS Take 1 tablet by mouth daily.    . traMADol (ULTRAM) 50 MG tablet Take 50 mg by mouth every 8 (eight) hours as needed.    . vitamin B-12 (CYANOCOBALAMIN) 1000 MCG tablet Take 1,000 mcg by mouth daily.    . vitamin E 1000 UNIT capsule Take 1,000 Units by mouth daily.     No current facility-administered medications for this visit.     PAST MEDICAL HISTORY: Past Medical History:  Diagnosis Date  . Anemia   . Anxiety   . Arthritis   . Cancer ([place])    hx bilateral breast cancer  . Constipation   . Cystic mastopathy   . Edema of extremities   . Esophageal reflux   . 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 Hypercholesteremia   . Hypertension   . Hypertension   . Hyponatremia   . IBS (irritable bowel syndrome)   . Insomnia   . Insomnia   . Lumbago   . Macular degeneration of both eyes   . Muscle cramping   . Osteoarthritis   . Osteopenia   . Postherpetic neuralgia   . Pruritic disorder   . Stage 4 chronic kidney disease ([place])   . Swelling of both ankles   . Tendinopathy of left gluteus medius    hip  . Weakness     PAST SURGICAL HISTORY: Past Surgical History:  Procedure Laterality Date  . CATARACT EXTRACTION W/ INTRAOCULAR LENS  IMPLANT, BILATERAL    . DECOMPRESSIVE LUMBAR LAMINECTOMY LEVEL 2  03/08/2012   Procedure: DECOMPRESSIVE LUMBAR LAMINECTOMY LEVEL 2;  Surgeon: Melina Schools, MD;  Location: [facility];  Service: Orthopedics;  Laterality: N/A;  Decompression L3-L5   . ELBOW SURGERY Left 2003  . EXCISION/RELEASE BURSA HIP Left 03/18/2015   Procedure: LEFT HIP BURSECTOMY WITH GLUTEAL TENDON REPAIR;  Surgeon: Gaynelle Arabian, MD;  Location: [facility];  Service: Orthopedics;  Laterality: Left;  . KNEE ARTHROSCOPY Left   . LEFT FOOT SURGERY  01-09-2008   left great  toe and left second toe  . MASTECTOMY W/ SENTINEL NODE BIOPSY Left 07/04/2012   Procedure: MASTECTOMY WITH SENTINEL LYMPH NODE BIOPSY;  Surgeon: Stark Klein, MD;  Location: [facility];  Service: General;  Laterality: Left;  . OPEN SURGICAL REPAIR OF GLUTEAL TENDON Right 09/27/2013   Procedure: RIGHT HIP BURSECTOMY/GLUTEAL TENDON REPAIR;  Surgeon: Gearlean Alf, MD;  Location: [facility];  Service: Orthopedics;  Laterality: Right;  . ROTATOR CUFF REPAIR Right ?2005  . TOTAL MASTECTOMY Right 08-08-2007   w/  SLN bx    FAMILY HISTORY: Family History  Problem Relation Age of Onset  . Heart attack Mother   . Hypertension Mother   . Congestive Heart Failure Father   . Pneumonia Father  SOCIAL HISTORY: Social History   Socioeconomic History  . Marital status: Married    Spouse name: Not on file  . Number of children: 1  . Years of education: 58  . Highest education level: High school graduate  Occupational History  . Occupation: Retired  Scientific laboratory technician  . Financial resource strain: Not on file  . Food insecurity    Worry: Not on file    Inability: Not on file  . Transportation needs    Medical: Not on file    Non-medical: Not on file  Tobacco Use  . Smoking status: Never Smoker  . Smokeless tobacco: Never Used  Substance and Sexual Activity  . Alcohol use: No  . Drug use: No  . Sexual activity: Not on file  Lifestyle  . Physical activity    Days per week: Not on file    Minutes per session: Not on file  . Stress: Not on file  Relationships  . Social Herbalist on phone: Not on file    Gets together: Not on file    Attends religious service: Not on file    Active member of club or organization: Not on file    Attends meetings of clubs or organizations: Not on file    Relationship status: Not on file  . Intimate partner violence    Fear of current or ex partner: Not on file    Emotionally abused: Not on file    Physically abused: Not on file     Forced sexual activity: Not on file  Other Topics Concern  . Not on file  Social History Narrative   Lives at home with her husband.   Right-handed.   No daily caffeine use.     PHYSICAL EXAM   Vitals:   12/10/18 1023  BP: (!) 141/72  Pulse: (!) 57  Temp: 97.7 F (36.5 C)    Not recorded      There is no height or weight on file to calculate BMI.  PHYSICAL EXAMNIATION:  Gen: NAD, conversant, well nourised,well groomed                     Cardiovascular: Regular rate rhythm, no peripheral edema, warm, nontender. Eyes: Conjunctivae clear without exudates or hemorrhage Neck: Supple, no carotid bruits. Pulmonary: Clear to auscultation bilaterally   NEUROLOGICAL EXAM:  MENTAL STATUS: Speech:    Speech is normal; fluent and spontaneous with normal comprehension.  Cognition:     Orientation to time, place and person     Normal recent and remote memory     Normal Attention span and concentration     Normal Language, naming, repeating,spontaneous speech     Fund of knowledge   CRANIAL NERVES: CN II: Visual fields are full to confrontation.   Pupils are round equal and briskly reactive to light. CN III, IV, VI: extraocular movement are normal. No ptosis. CN V: Facial sensation is intact to pinprick in all 3 divisions bilaterally. Corneal responses are intact.  CN VII: Face is symmetric with normal eye closure and smile. CN VIII: Hearing is normal to casual conversation CN IX, X: Palate elevates symmetrically. Phonation is normal. CN XI: Head turning and shoulder shrug are intact CN XII: Tongue is midline with normal movements and no atrophy.  MOTOR: Limited range of motion of left shoulder,  Mild bilateral shoulder abduction and external rotation weakness, no significant hand grip weakness, mild to moderate bilateral hip flexion, ankle dorsiflexion weakness  REFLEXES: Reflexes are 2+ and symmetric at the biceps, triceps, knees, and absent at ankles. Plantar  responses are flexor.  SENSORY: Mildly decreased light touch, vibratory sensation at toes.  COORDINATION: Rapid alternating movements and fine finger movements are intact. There is no dysmetria on finger-to-nose and heel-knee-shin.    GAIT/STANCE: She needs assistance to get up from seated position, difficulty initiate gait,    DIAGNOSTIC DATA (LABS, IMAGING, TESTING) - I reviewed patient records, labs, notes, testing and imaging myself where available.   ASSESSMENT AND PLAN  Victoria Moran is a 83 y.o. female   Gait abnormalities Worsening weakness of upper and lower extremities following fall in March 2020  Her gait abnormality are multifactorial, this including cervical myelopathy, lumbar stenosis, lumbar radiculopathy, multiple joints pain, deconditioning,  EMG/NCS  Continue Home Rehab.   Marcial Pacas, M.D. Ph.D.  [facility] [address] [place], [place] [address] Ph: [phone] Fax: [phone]  CC: [name], MD

## 2018-12-11 DIAGNOSIS — I10 Essential (primary) hypertension: Secondary | ICD-10-CM | POA: Diagnosis not present

## 2018-12-11 DIAGNOSIS — E871 Hypo-osmolality and hyponatremia: Secondary | ICD-10-CM | POA: Diagnosis not present

## 2018-12-11 DIAGNOSIS — R2681 Unsteadiness on feet: Secondary | ICD-10-CM | POA: Diagnosis not present

## 2018-12-11 DIAGNOSIS — R29898 Other symptoms and signs involving the musculoskeletal system: Secondary | ICD-10-CM | POA: Diagnosis not present

## 2018-12-11 DIAGNOSIS — M810 Age-related osteoporosis without current pathological fracture: Secondary | ICD-10-CM | POA: Diagnosis not present

## 2018-12-11 DIAGNOSIS — B0229 Other postherpetic nervous system involvement: Secondary | ICD-10-CM | POA: Diagnosis not present

## 2018-12-11 DIAGNOSIS — R600 Localized edema: Secondary | ICD-10-CM | POA: Diagnosis not present

## 2018-12-11 DIAGNOSIS — K580 Irritable bowel syndrome with diarrhea: Secondary | ICD-10-CM | POA: Diagnosis not present

## 2018-12-11 DIAGNOSIS — J309 Allergic rhinitis, unspecified: Secondary | ICD-10-CM | POA: Diagnosis not present

## 2018-12-11 DIAGNOSIS — K219 Gastro-esophageal reflux disease without esophagitis: Secondary | ICD-10-CM | POA: Diagnosis not present

## 2018-12-11 DIAGNOSIS — L299 Pruritus, unspecified: Secondary | ICD-10-CM | POA: Diagnosis not present

## 2018-12-19 DIAGNOSIS — H353211 Exudative age-related macular degeneration, right eye, with active choroidal neovascularization: Secondary | ICD-10-CM | POA: Diagnosis not present

## 2019-01-07 ENCOUNTER — Ambulatory Visit (INDEPENDENT_AMBULATORY_CARE_PROVIDER_SITE_OTHER): Payer: PPO | Admitting: Neurology

## 2019-01-07 ENCOUNTER — Other Ambulatory Visit: Payer: Self-pay

## 2019-01-07 DIAGNOSIS — R531 Weakness: Secondary | ICD-10-CM | POA: Diagnosis not present

## 2019-01-07 DIAGNOSIS — Z0289 Encounter for other administrative examinations: Secondary | ICD-10-CM

## 2019-01-07 DIAGNOSIS — G3281 Cerebellar ataxia in diseases classified elsewhere: Secondary | ICD-10-CM | POA: Diagnosis not present

## 2019-01-07 DIAGNOSIS — G6289 Other specified polyneuropathies: Secondary | ICD-10-CM

## 2019-01-07 DIAGNOSIS — R269 Unspecified abnormalities of gait and mobility: Secondary | ICD-10-CM | POA: Diagnosis not present

## 2019-01-07 DIAGNOSIS — M5416 Radiculopathy, lumbar region: Secondary | ICD-10-CM

## 2019-01-07 DIAGNOSIS — G629 Polyneuropathy, unspecified: Secondary | ICD-10-CM | POA: Insufficient documentation

## 2019-01-07 NOTE — Procedures (Signed)
Full Name: Victoria Moran Gender: Female MRN #: [id number] Date of Birth: 08-04-1935    Visit Date: 01/07/2019 09:08 Age: 83 Years 2 Months Old Examining Physician: Marcial Pacas, MD  Referring Physician: Marcial Pacas, MD History:  83 year old female, presented with significant gait abnormality.  Summary of the tests: Nerve conduction study: Bilateral sural, superficial peroneal sensory responses were absent.  Right ulnar, radial sensory responses showed significantly decreased snap amplitude, with mildly prolonged peak latency.  Bilateral peroneal motor responses were absent.  Bilateral tibial motor responses showed moderately prolonged distal latency, with significantly decreased CMAP amplitude.  Right ulnar motor responses showed mildly prolonged distal latency, with mildly decreased CMAP amplitude.  Electromyography: Selected needle examinations was performed at bilateral lower extremity muscles and bilateral lumbosacral paraspinal muscles.  There was evidence of chronic neuropathic changes involving bilateral L4, L5 myotomes.  There was no spontaneous activity at bilateral lumbosacral paraspinal muscles.  Conclusion: This is an abnormal study.  There is electrodiagnostic evidence of length dependent sensorimotor polyneuropathy.  There is also evidence of chronic bilateral lumbosacral radiculopathy, mainly involving bilateral L4 5 myotomes.    ------------------------------- Marcial Pacas, M.D. PhD  [facility] [place], [place] [address] Tel: [phone] Fax: [phone]        [facility]    Nerve / Sites Muscle Latency Ref. Amplitude Ref. Rel Amp Segments Distance Velocity Ref. Area    ms ms mV mV %  cm m/s m/s mVms  R Ulnar - ADM     Wrist ADM 4.1 ?3.3 5.5 ?6.0 100 Wrist - ADM 7   21.4     B.Elbow ADM 7.9  3.0  54.6 B.Elbow - Wrist 18 47 ?49 16.4     A.Elbow ADM 10.4  3.0  99.9 A.Elbow - B.Elbow 10 39 ?49 16.4         A.Elbow - Wrist      R  Peroneal - EDB     Ankle EDB NR ?6.5 NR ?2.0 NR Ankle - EDB 9   NR     Fib head EDB NR  NR  NR Fib head - Ankle 24 NR ?44 NR     Pop fossa EDB NR  NR  NR Pop fossa - Fib head 10 NR ?44 NR         Pop fossa - Ankle      L Peroneal - EDB     Ankle EDB NR ?6.5 NR ?2.0 NR Ankle - EDB 9   NR     Fib head EDB NR  NR  NR Fib head - Ankle 25 NR ?44 NR     Pop fossa EDB NR  NR  NR Pop fossa - Fib head 10 NR ?44 NR         Pop fossa - Ankle      R Tibial - AH     Ankle AH 8.4 ?5.8 0.8 ?4.0 100 Ankle - AH 9   1.9     Pop fossa AH 21.0  0.9  107 Pop fossa - Ankle 35 28 ?41 1.5  L Tibial - AH     Ankle AH 6.8 ?5.8 0.5 ?4.0 100 Ankle - AH 9   3.7     Pop fossa AH 19.6  0.3  51.1 Pop fossa - Ankle 34 27 ?41 1.3               SNC    Nerve / Sites Rec. Site  Peak Lat Ref.  Amp Ref. Segments Distance    ms ms V V  cm  R Radial - Anatomical snuff box (Forearm)     Forearm Wrist 3.2 ?2.9 7 ?15 Forearm - Wrist 10  R Sural - Ankle (Calf)     Calf Ankle NR ?4.4 NR ?6 Calf - Ankle 14  L Sural - Ankle (Calf)     Calf Ankle NR ?4.4 NR ?6 Calf - Ankle 14  R Superficial peroneal - Ankle     Lat leg Ankle NR ?4.4 NR ?6 Lat leg - Ankle 14  L Superficial peroneal - Ankle     Lat leg Ankle NR ?4.4 NR ?6 Lat leg - Ankle 14  R Ulnar - Orthodromic, (Dig V, Mid palm)     Dig V Wrist 4.5 ?3.1 4 ?5 Dig V - Wrist 34                 F  Wave    Nerve F Lat Ref.   ms ms  R Tibial - AH 65.9 ?56.0  L Tibial - AH 65.8 ?56.0  R Ulnar - ADM 32.3 ?32.0           EMG       EMG Summary Table    Spontaneous MUAP Recruitment  Muscle IA Fib PSW Fasc Other Amp Dur. Poly Pattern  R. Tibialis anterior Normal None None None _______ Normal Normal Normal Reduced  R. Tibialis posterior Normal None None None _______ Normal Normal Normal Reduced  R. Gastrocnemius (Medial head) Normal None None None _______ Normal Normal Normal Reduced  R. Vastus lateralis Normal None None None _______ Normal Normal Normal Reduced  L. Tibialis  anterior Normal None None None _______ Normal Normal Normal Reduced  L. Tibialis posterior Normal None None None _______ Normal Normal Normal Reduced  L. Peroneus longus Normal None None None _______ Normal Normal Normal Reduced  L. Gastrocnemius (Medial head) Normal None None None _______ Normal Normal Normal Reduced  L. Vastus lateralis Normal None None None _______ Normal Normal Normal Reduced  L. Lumbar paraspinals (mid) Normal None None None _______ Normal Normal Normal Normal  L. Lumbar paraspinals (low) Normal None None None _______ Normal Normal Normal Normal  R. Lumbar paraspinals (mid) Normal None None None _______ Normal Normal Normal Normal  R. Lumbar paraspinals (low) Normal None None None _______ Normal Normal Normal Normal

## 2019-01-08 DIAGNOSIS — J309 Allergic rhinitis, unspecified: Secondary | ICD-10-CM | POA: Diagnosis not present

## 2019-01-08 DIAGNOSIS — R29898 Other symptoms and signs involving the musculoskeletal system: Secondary | ICD-10-CM | POA: Diagnosis not present

## 2019-01-08 DIAGNOSIS — E663 Overweight: Secondary | ICD-10-CM | POA: Diagnosis not present

## 2019-01-08 DIAGNOSIS — R2681 Unsteadiness on feet: Secondary | ICD-10-CM | POA: Diagnosis not present

## 2019-01-08 DIAGNOSIS — R600 Localized edema: Secondary | ICD-10-CM | POA: Diagnosis not present

## 2019-01-08 DIAGNOSIS — K219 Gastro-esophageal reflux disease without esophagitis: Secondary | ICD-10-CM | POA: Diagnosis not present

## 2019-01-08 DIAGNOSIS — M810 Age-related osteoporosis without current pathological fracture: Secondary | ICD-10-CM | POA: Diagnosis not present

## 2019-01-08 DIAGNOSIS — L299 Pruritus, unspecified: Secondary | ICD-10-CM | POA: Diagnosis not present

## 2019-01-08 DIAGNOSIS — E871 Hypo-osmolality and hyponatremia: Secondary | ICD-10-CM | POA: Diagnosis not present

## 2019-01-08 DIAGNOSIS — K580 Irritable bowel syndrome with diarrhea: Secondary | ICD-10-CM | POA: Diagnosis not present

## 2019-01-08 DIAGNOSIS — B0229 Other postherpetic nervous system involvement: Secondary | ICD-10-CM | POA: Diagnosis not present

## 2019-01-08 DIAGNOSIS — I10 Essential (primary) hypertension: Secondary | ICD-10-CM | POA: Diagnosis not present

## 2019-01-08 LAB — B. BURGDORFI ANTIBODIES: Lyme IgG/IgM Ab: <0.91 {ISR} (ref 0.00–0.90)

## 2019-01-08 NOTE — Progress Notes (Signed)
See EMG nerve conduction study on the procedure

## 2019-01-30 DIAGNOSIS — H353211 Exudative age-related macular degeneration, right eye, with active choroidal neovascularization: Secondary | ICD-10-CM | POA: Diagnosis not present

## 2019-02-05 DIAGNOSIS — E7800 Pure hypercholesterolemia, unspecified: Secondary | ICD-10-CM | POA: Diagnosis not present

## 2019-02-05 DIAGNOSIS — L299 Pruritus, unspecified: Secondary | ICD-10-CM | POA: Diagnosis not present

## 2019-02-05 DIAGNOSIS — J309 Allergic rhinitis, unspecified: Secondary | ICD-10-CM | POA: Diagnosis not present

## 2019-02-05 DIAGNOSIS — R2681 Unsteadiness on feet: Secondary | ICD-10-CM | POA: Diagnosis not present

## 2019-02-05 DIAGNOSIS — R600 Localized edema: Secondary | ICD-10-CM | POA: Diagnosis not present

## 2019-02-05 DIAGNOSIS — M810 Age-related osteoporosis without current pathological fracture: Secondary | ICD-10-CM | POA: Diagnosis not present

## 2019-02-05 DIAGNOSIS — I10 Essential (primary) hypertension: Secondary | ICD-10-CM | POA: Diagnosis not present

## 2019-02-05 DIAGNOSIS — R29898 Other symptoms and signs involving the musculoskeletal system: Secondary | ICD-10-CM | POA: Diagnosis not present

## 2019-02-05 DIAGNOSIS — K219 Gastro-esophageal reflux disease without esophagitis: Secondary | ICD-10-CM | POA: Diagnosis not present

## 2019-02-05 DIAGNOSIS — B0229 Other postherpetic nervous system involvement: Secondary | ICD-10-CM | POA: Diagnosis not present

## 2019-02-05 DIAGNOSIS — K580 Irritable bowel syndrome with diarrhea: Secondary | ICD-10-CM | POA: Diagnosis not present

## 2019-02-05 DIAGNOSIS — E871 Hypo-osmolality and hyponatremia: Secondary | ICD-10-CM | POA: Diagnosis not present

## 2019-02-21 DIAGNOSIS — J309 Allergic rhinitis, unspecified: Secondary | ICD-10-CM | POA: Diagnosis not present

## 2019-02-21 DIAGNOSIS — R29898 Other symptoms and signs involving the musculoskeletal system: Secondary | ICD-10-CM | POA: Diagnosis not present

## 2019-02-21 DIAGNOSIS — R2681 Unsteadiness on feet: Secondary | ICD-10-CM | POA: Diagnosis not present

## 2019-02-21 DIAGNOSIS — E871 Hypo-osmolality and hyponatremia: Secondary | ICD-10-CM | POA: Diagnosis not present

## 2019-02-21 DIAGNOSIS — I10 Essential (primary) hypertension: Secondary | ICD-10-CM | POA: Diagnosis not present

## 2019-02-21 DIAGNOSIS — E663 Overweight: Secondary | ICD-10-CM | POA: Diagnosis not present

## 2019-02-21 DIAGNOSIS — R1031 Right lower quadrant pain: Secondary | ICD-10-CM | POA: Diagnosis not present

## 2019-02-21 DIAGNOSIS — B0229 Other postherpetic nervous system involvement: Secondary | ICD-10-CM | POA: Diagnosis not present

## 2019-02-21 DIAGNOSIS — K580 Irritable bowel syndrome with diarrhea: Secondary | ICD-10-CM | POA: Diagnosis not present

## 2019-02-21 DIAGNOSIS — R600 Localized edema: Secondary | ICD-10-CM | POA: Diagnosis not present

## 2019-02-21 DIAGNOSIS — L299 Pruritus, unspecified: Secondary | ICD-10-CM | POA: Diagnosis not present

## 2019-02-21 DIAGNOSIS — M810 Age-related osteoporosis without current pathological fracture: Secondary | ICD-10-CM | POA: Diagnosis not present

## 2019-02-22 DIAGNOSIS — E0590 Thyrotoxicosis, unspecified without thyrotoxic crisis or storm: Secondary | ICD-10-CM | POA: Diagnosis not present

## 2019-02-22 DIAGNOSIS — R112 Nausea with vomiting, unspecified: Secondary | ICD-10-CM | POA: Diagnosis not present

## 2019-02-22 DIAGNOSIS — N3000 Acute cystitis without hematuria: Secondary | ICD-10-CM | POA: Diagnosis not present

## 2019-02-22 DIAGNOSIS — M160 Bilateral primary osteoarthritis of hip: Secondary | ICD-10-CM | POA: Diagnosis not present

## 2019-02-22 DIAGNOSIS — R109 Unspecified abdominal pain: Secondary | ICD-10-CM | POA: Diagnosis not present

## 2019-02-22 DIAGNOSIS — M1611 Unilateral primary osteoarthritis, right hip: Secondary | ICD-10-CM | POA: Diagnosis not present

## 2019-02-24 DIAGNOSIS — N179 Acute kidney failure, unspecified: Secondary | ICD-10-CM

## 2019-02-24 DIAGNOSIS — R2681 Unsteadiness on feet: Secondary | ICD-10-CM | POA: Diagnosis not present

## 2019-02-24 DIAGNOSIS — R339 Retention of urine, unspecified: Secondary | ICD-10-CM

## 2019-02-24 DIAGNOSIS — I10 Essential (primary) hypertension: Secondary | ICD-10-CM

## 2019-02-24 DIAGNOSIS — F329 Major depressive disorder, single episode, unspecified: Secondary | ICD-10-CM

## 2019-02-25 DIAGNOSIS — R2689 Other abnormalities of gait and mobility: Secondary | ICD-10-CM | POA: Diagnosis not present

## 2019-02-25 DIAGNOSIS — M160 Bilateral primary osteoarthritis of hip: Secondary | ICD-10-CM | POA: Diagnosis not present

## 2019-02-25 DIAGNOSIS — Z885 Allergy status to narcotic agent status: Secondary | ICD-10-CM | POA: Diagnosis not present

## 2019-02-25 DIAGNOSIS — N390 Urinary tract infection, site not specified: Secondary | ICD-10-CM | POA: Diagnosis not present

## 2019-02-25 DIAGNOSIS — K219 Gastro-esophageal reflux disease without esophagitis: Secondary | ICD-10-CM | POA: Diagnosis not present

## 2019-02-25 DIAGNOSIS — I129 Hypertensive chronic kidney disease with stage 1 through stage 4 chronic kidney disease, or unspecified chronic kidney disease: Secondary | ICD-10-CM | POA: Diagnosis not present

## 2019-02-25 DIAGNOSIS — R339 Retention of urine, unspecified: Secondary | ICD-10-CM | POA: Diagnosis not present

## 2019-02-25 DIAGNOSIS — Z853 Personal history of malignant neoplasm of breast: Secondary | ICD-10-CM | POA: Diagnosis not present

## 2019-02-25 DIAGNOSIS — R2681 Unsteadiness on feet: Secondary | ICD-10-CM | POA: Diagnosis not present

## 2019-02-25 DIAGNOSIS — E0590 Thyrotoxicosis, unspecified without thyrotoxic crisis or storm: Secondary | ICD-10-CM | POA: Diagnosis not present

## 2019-02-25 DIAGNOSIS — N189 Chronic kidney disease, unspecified: Secondary | ICD-10-CM | POA: Diagnosis not present

## 2019-02-25 DIAGNOSIS — Z79899 Other long term (current) drug therapy: Secondary | ICD-10-CM | POA: Diagnosis not present

## 2019-02-25 DIAGNOSIS — Z7952 Long term (current) use of systemic steroids: Secondary | ICD-10-CM | POA: Diagnosis not present

## 2019-02-25 DIAGNOSIS — T50905A Adverse effect of unspecified drugs, medicaments and biological substances, initial encounter: Secondary | ICD-10-CM | POA: Diagnosis not present

## 2019-02-25 DIAGNOSIS — I10 Essential (primary) hypertension: Secondary | ICD-10-CM | POA: Diagnosis not present

## 2019-02-25 DIAGNOSIS — Z66 Do not resuscitate: Secondary | ICD-10-CM | POA: Diagnosis not present

## 2019-02-25 DIAGNOSIS — F329 Major depressive disorder, single episode, unspecified: Secondary | ICD-10-CM | POA: Diagnosis not present

## 2019-02-25 DIAGNOSIS — Z792 Long term (current) use of antibiotics: Secondary | ICD-10-CM | POA: Diagnosis not present

## 2019-02-25 DIAGNOSIS — N179 Acute kidney failure, unspecified: Secondary | ICD-10-CM | POA: Diagnosis not present

## 2019-02-25 DIAGNOSIS — Z87440 Personal history of urinary (tract) infections: Secondary | ICD-10-CM | POA: Diagnosis not present

## 2019-02-28 DIAGNOSIS — H3530 Unspecified macular degeneration: Secondary | ICD-10-CM | POA: Diagnosis not present

## 2019-02-28 DIAGNOSIS — Z853 Personal history of malignant neoplasm of breast: Secondary | ICD-10-CM | POA: Diagnosis not present

## 2019-02-28 DIAGNOSIS — M160 Bilateral primary osteoarthritis of hip: Secondary | ICD-10-CM | POA: Diagnosis not present

## 2019-02-28 DIAGNOSIS — Z9181 History of falling: Secondary | ICD-10-CM | POA: Diagnosis not present

## 2019-02-28 DIAGNOSIS — E785 Hyperlipidemia, unspecified: Secondary | ICD-10-CM | POA: Diagnosis not present

## 2019-02-28 DIAGNOSIS — Z87440 Personal history of urinary (tract) infections: Secondary | ICD-10-CM | POA: Diagnosis not present

## 2019-02-28 DIAGNOSIS — E871 Hypo-osmolality and hyponatremia: Secondary | ICD-10-CM | POA: Diagnosis not present

## 2019-02-28 DIAGNOSIS — I129 Hypertensive chronic kidney disease with stage 1 through stage 4 chronic kidney disease, or unspecified chronic kidney disease: Secondary | ICD-10-CM | POA: Diagnosis not present

## 2019-02-28 DIAGNOSIS — E0590 Thyrotoxicosis, unspecified without thyrotoxic crisis or storm: Secondary | ICD-10-CM | POA: Diagnosis not present

## 2019-02-28 DIAGNOSIS — K219 Gastro-esophageal reflux disease without esophagitis: Secondary | ICD-10-CM | POA: Diagnosis not present

## 2019-02-28 DIAGNOSIS — R296 Repeated falls: Secondary | ICD-10-CM | POA: Diagnosis not present

## 2019-02-28 DIAGNOSIS — N184 Chronic kidney disease, stage 4 (severe): Secondary | ICD-10-CM | POA: Diagnosis not present

## 2019-02-28 DIAGNOSIS — K589 Irritable bowel syndrome without diarrhea: Secondary | ICD-10-CM | POA: Diagnosis not present

## 2019-02-28 DIAGNOSIS — D631 Anemia in chronic kidney disease: Secondary | ICD-10-CM | POA: Diagnosis not present

## 2019-02-28 DIAGNOSIS — M810 Age-related osteoporosis without current pathological fracture: Secondary | ICD-10-CM | POA: Diagnosis not present

## 2019-02-28 DIAGNOSIS — R29898 Other symptoms and signs involving the musculoskeletal system: Secondary | ICD-10-CM | POA: Diagnosis not present

## 2019-02-28 DIAGNOSIS — G4700 Insomnia, unspecified: Secondary | ICD-10-CM | POA: Diagnosis not present

## 2019-02-28 DIAGNOSIS — Z7952 Long term (current) use of systemic steroids: Secondary | ICD-10-CM | POA: Diagnosis not present

## 2019-02-28 DIAGNOSIS — B0229 Other postherpetic nervous system involvement: Secondary | ICD-10-CM | POA: Diagnosis not present

## 2019-02-28 DIAGNOSIS — J309 Allergic rhinitis, unspecified: Secondary | ICD-10-CM | POA: Diagnosis not present

## 2019-03-05 DIAGNOSIS — R1031 Right lower quadrant pain: Secondary | ICD-10-CM | POA: Diagnosis not present

## 2019-03-05 DIAGNOSIS — K219 Gastro-esophageal reflux disease without esophagitis: Secondary | ICD-10-CM | POA: Diagnosis not present

## 2019-03-05 DIAGNOSIS — M810 Age-related osteoporosis without current pathological fracture: Secondary | ICD-10-CM | POA: Diagnosis not present

## 2019-03-05 DIAGNOSIS — R600 Localized edema: Secondary | ICD-10-CM | POA: Diagnosis not present

## 2019-03-05 DIAGNOSIS — I10 Essential (primary) hypertension: Secondary | ICD-10-CM | POA: Diagnosis not present

## 2019-03-05 DIAGNOSIS — R2681 Unsteadiness on feet: Secondary | ICD-10-CM | POA: Diagnosis not present

## 2019-03-05 DIAGNOSIS — J309 Allergic rhinitis, unspecified: Secondary | ICD-10-CM | POA: Diagnosis not present

## 2019-03-05 DIAGNOSIS — B0229 Other postherpetic nervous system involvement: Secondary | ICD-10-CM | POA: Diagnosis not present

## 2019-03-05 DIAGNOSIS — K580 Irritable bowel syndrome with diarrhea: Secondary | ICD-10-CM | POA: Diagnosis not present

## 2019-03-05 DIAGNOSIS — R29898 Other symptoms and signs involving the musculoskeletal system: Secondary | ICD-10-CM | POA: Diagnosis not present

## 2019-03-05 DIAGNOSIS — E871 Hypo-osmolality and hyponatremia: Secondary | ICD-10-CM | POA: Diagnosis not present

## 2019-03-05 DIAGNOSIS — L299 Pruritus, unspecified: Secondary | ICD-10-CM | POA: Diagnosis not present

## 2019-03-12 DIAGNOSIS — R29898 Other symptoms and signs involving the musculoskeletal system: Secondary | ICD-10-CM | POA: Diagnosis not present

## 2019-03-12 DIAGNOSIS — M810 Age-related osteoporosis without current pathological fracture: Secondary | ICD-10-CM | POA: Diagnosis not present

## 2019-03-12 DIAGNOSIS — R1031 Right lower quadrant pain: Secondary | ICD-10-CM | POA: Diagnosis not present

## 2019-03-12 DIAGNOSIS — K219 Gastro-esophageal reflux disease without esophagitis: Secondary | ICD-10-CM | POA: Diagnosis not present

## 2019-03-12 DIAGNOSIS — K580 Irritable bowel syndrome with diarrhea: Secondary | ICD-10-CM | POA: Diagnosis not present

## 2019-03-12 DIAGNOSIS — E871 Hypo-osmolality and hyponatremia: Secondary | ICD-10-CM | POA: Diagnosis not present

## 2019-03-12 DIAGNOSIS — L299 Pruritus, unspecified: Secondary | ICD-10-CM | POA: Diagnosis not present

## 2019-03-12 DIAGNOSIS — R2681 Unsteadiness on feet: Secondary | ICD-10-CM | POA: Diagnosis not present

## 2019-03-12 DIAGNOSIS — J309 Allergic rhinitis, unspecified: Secondary | ICD-10-CM | POA: Diagnosis not present

## 2019-03-12 DIAGNOSIS — E7800 Pure hypercholesterolemia, unspecified: Secondary | ICD-10-CM | POA: Diagnosis not present

## 2019-03-12 DIAGNOSIS — B0229 Other postherpetic nervous system involvement: Secondary | ICD-10-CM | POA: Diagnosis not present

## 2019-03-12 DIAGNOSIS — R600 Localized edema: Secondary | ICD-10-CM | POA: Diagnosis not present

## 2019-03-20 DIAGNOSIS — H353211 Exudative age-related macular degeneration, right eye, with active choroidal neovascularization: Secondary | ICD-10-CM | POA: Diagnosis not present

## 2019-03-20 DIAGNOSIS — H43813 Vitreous degeneration, bilateral: Secondary | ICD-10-CM | POA: Diagnosis not present

## 2019-03-20 DIAGNOSIS — H353132 Nonexudative age-related macular degeneration, bilateral, intermediate dry stage: Secondary | ICD-10-CM | POA: Diagnosis not present

## 2019-03-30 DIAGNOSIS — N184 Chronic kidney disease, stage 4 (severe): Secondary | ICD-10-CM | POA: Diagnosis not present

## 2019-03-30 DIAGNOSIS — Z853 Personal history of malignant neoplasm of breast: Secondary | ICD-10-CM | POA: Diagnosis not present

## 2019-03-30 DIAGNOSIS — R29898 Other symptoms and signs involving the musculoskeletal system: Secondary | ICD-10-CM | POA: Diagnosis not present

## 2019-03-30 DIAGNOSIS — M160 Bilateral primary osteoarthritis of hip: Secondary | ICD-10-CM | POA: Diagnosis not present

## 2019-03-30 DIAGNOSIS — B0229 Other postherpetic nervous system involvement: Secondary | ICD-10-CM | POA: Diagnosis not present

## 2019-03-30 DIAGNOSIS — J309 Allergic rhinitis, unspecified: Secondary | ICD-10-CM | POA: Diagnosis not present

## 2019-03-30 DIAGNOSIS — K219 Gastro-esophageal reflux disease without esophagitis: Secondary | ICD-10-CM | POA: Diagnosis not present

## 2019-03-30 DIAGNOSIS — R296 Repeated falls: Secondary | ICD-10-CM | POA: Diagnosis not present

## 2019-03-30 DIAGNOSIS — Z9181 History of falling: Secondary | ICD-10-CM | POA: Diagnosis not present

## 2019-03-30 DIAGNOSIS — Z7952 Long term (current) use of systemic steroids: Secondary | ICD-10-CM | POA: Diagnosis not present

## 2019-03-30 DIAGNOSIS — M810 Age-related osteoporosis without current pathological fracture: Secondary | ICD-10-CM | POA: Diagnosis not present

## 2019-03-30 DIAGNOSIS — Z87440 Personal history of urinary (tract) infections: Secondary | ICD-10-CM | POA: Diagnosis not present

## 2019-03-30 DIAGNOSIS — D631 Anemia in chronic kidney disease: Secondary | ICD-10-CM | POA: Diagnosis not present

## 2019-03-30 DIAGNOSIS — E785 Hyperlipidemia, unspecified: Secondary | ICD-10-CM | POA: Diagnosis not present

## 2019-03-30 DIAGNOSIS — K589 Irritable bowel syndrome without diarrhea: Secondary | ICD-10-CM | POA: Diagnosis not present

## 2019-03-30 DIAGNOSIS — G4700 Insomnia, unspecified: Secondary | ICD-10-CM | POA: Diagnosis not present

## 2019-03-30 DIAGNOSIS — I129 Hypertensive chronic kidney disease with stage 1 through stage 4 chronic kidney disease, or unspecified chronic kidney disease: Secondary | ICD-10-CM | POA: Diagnosis not present

## 2019-03-30 DIAGNOSIS — H3530 Unspecified macular degeneration: Secondary | ICD-10-CM | POA: Diagnosis not present

## 2019-03-30 DIAGNOSIS — E871 Hypo-osmolality and hyponatremia: Secondary | ICD-10-CM | POA: Diagnosis not present

## 2019-03-30 DIAGNOSIS — E0590 Thyrotoxicosis, unspecified without thyrotoxic crisis or storm: Secondary | ICD-10-CM | POA: Diagnosis not present

## 2019-04-02 DIAGNOSIS — E871 Hypo-osmolality and hyponatremia: Secondary | ICD-10-CM | POA: Diagnosis not present

## 2019-04-02 DIAGNOSIS — N184 Chronic kidney disease, stage 4 (severe): Secondary | ICD-10-CM | POA: Diagnosis not present

## 2019-04-02 DIAGNOSIS — L299 Pruritus, unspecified: Secondary | ICD-10-CM | POA: Diagnosis not present

## 2019-04-02 DIAGNOSIS — R29898 Other symptoms and signs involving the musculoskeletal system: Secondary | ICD-10-CM | POA: Diagnosis not present

## 2019-04-02 DIAGNOSIS — K219 Gastro-esophageal reflux disease without esophagitis: Secondary | ICD-10-CM | POA: Diagnosis not present

## 2019-04-02 DIAGNOSIS — J309 Allergic rhinitis, unspecified: Secondary | ICD-10-CM | POA: Diagnosis not present

## 2019-04-02 DIAGNOSIS — R2681 Unsteadiness on feet: Secondary | ICD-10-CM | POA: Diagnosis not present

## 2019-04-02 DIAGNOSIS — R600 Localized edema: Secondary | ICD-10-CM | POA: Diagnosis not present

## 2019-04-02 DIAGNOSIS — M810 Age-related osteoporosis without current pathological fracture: Secondary | ICD-10-CM | POA: Diagnosis not present

## 2019-04-02 DIAGNOSIS — B0229 Other postherpetic nervous system involvement: Secondary | ICD-10-CM | POA: Diagnosis not present

## 2019-04-02 DIAGNOSIS — K580 Irritable bowel syndrome with diarrhea: Secondary | ICD-10-CM | POA: Diagnosis not present

## 2019-04-02 DIAGNOSIS — E663 Overweight: Secondary | ICD-10-CM | POA: Diagnosis not present

## 2019-04-02 DIAGNOSIS — R1031 Right lower quadrant pain: Secondary | ICD-10-CM | POA: Diagnosis not present

## 2019-04-10 DIAGNOSIS — R600 Localized edema: Secondary | ICD-10-CM | POA: Diagnosis not present

## 2019-04-10 DIAGNOSIS — N184 Chronic kidney disease, stage 4 (severe): Secondary | ICD-10-CM | POA: Diagnosis not present

## 2019-04-10 DIAGNOSIS — N2581 Secondary hyperparathyroidism of renal origin: Secondary | ICD-10-CM | POA: Diagnosis not present

## 2019-04-10 DIAGNOSIS — D631 Anemia in chronic kidney disease: Secondary | ICD-10-CM | POA: Diagnosis not present

## 2019-04-10 DIAGNOSIS — I129 Hypertensive chronic kidney disease with stage 1 through stage 4 chronic kidney disease, or unspecified chronic kidney disease: Secondary | ICD-10-CM | POA: Diagnosis not present

## 2019-04-30 DIAGNOSIS — R29898 Other symptoms and signs involving the musculoskeletal system: Secondary | ICD-10-CM | POA: Diagnosis not present

## 2019-04-30 DIAGNOSIS — R1031 Right lower quadrant pain: Secondary | ICD-10-CM | POA: Diagnosis not present

## 2019-04-30 DIAGNOSIS — B0229 Other postherpetic nervous system involvement: Secondary | ICD-10-CM | POA: Diagnosis not present

## 2019-04-30 DIAGNOSIS — M810 Age-related osteoporosis without current pathological fracture: Secondary | ICD-10-CM | POA: Diagnosis not present

## 2019-04-30 DIAGNOSIS — R2681 Unsteadiness on feet: Secondary | ICD-10-CM | POA: Diagnosis not present

## 2019-04-30 DIAGNOSIS — E871 Hypo-osmolality and hyponatremia: Secondary | ICD-10-CM | POA: Diagnosis not present

## 2019-04-30 DIAGNOSIS — K580 Irritable bowel syndrome with diarrhea: Secondary | ICD-10-CM | POA: Diagnosis not present

## 2019-04-30 DIAGNOSIS — K219 Gastro-esophageal reflux disease without esophagitis: Secondary | ICD-10-CM | POA: Diagnosis not present

## 2019-04-30 DIAGNOSIS — D649 Anemia, unspecified: Secondary | ICD-10-CM | POA: Diagnosis not present

## 2019-04-30 DIAGNOSIS — J309 Allergic rhinitis, unspecified: Secondary | ICD-10-CM | POA: Diagnosis not present

## 2019-04-30 DIAGNOSIS — L299 Pruritus, unspecified: Secondary | ICD-10-CM | POA: Diagnosis not present

## 2019-04-30 DIAGNOSIS — R600 Localized edema: Secondary | ICD-10-CM | POA: Diagnosis not present

## 2019-05-01 DIAGNOSIS — H353211 Exudative age-related macular degeneration, right eye, with active choroidal neovascularization: Secondary | ICD-10-CM | POA: Diagnosis not present

## 2019-05-01 DIAGNOSIS — H43813 Vitreous degeneration, bilateral: Secondary | ICD-10-CM | POA: Diagnosis not present

## 2019-05-01 DIAGNOSIS — H353132 Nonexudative age-related macular degeneration, bilateral, intermediate dry stage: Secondary | ICD-10-CM | POA: Diagnosis not present

## 2019-05-25 DIAGNOSIS — R2681 Unsteadiness on feet: Secondary | ICD-10-CM | POA: Diagnosis not present

## 2019-05-25 DIAGNOSIS — J309 Allergic rhinitis, unspecified: Secondary | ICD-10-CM | POA: Diagnosis not present

## 2019-05-25 DIAGNOSIS — M25561 Pain in right knee: Secondary | ICD-10-CM | POA: Diagnosis not present

## 2019-05-25 DIAGNOSIS — B0229 Other postherpetic nervous system involvement: Secondary | ICD-10-CM | POA: Diagnosis not present

## 2019-05-25 DIAGNOSIS — R1031 Right lower quadrant pain: Secondary | ICD-10-CM | POA: Diagnosis not present

## 2019-05-25 DIAGNOSIS — M810 Age-related osteoporosis without current pathological fracture: Secondary | ICD-10-CM | POA: Diagnosis not present

## 2019-05-25 DIAGNOSIS — R600 Localized edema: Secondary | ICD-10-CM | POA: Diagnosis not present

## 2019-05-25 DIAGNOSIS — L299 Pruritus, unspecified: Secondary | ICD-10-CM | POA: Diagnosis not present

## 2019-05-25 DIAGNOSIS — K219 Gastro-esophageal reflux disease without esophagitis: Secondary | ICD-10-CM | POA: Diagnosis not present

## 2019-05-25 DIAGNOSIS — L0390 Cellulitis, unspecified: Secondary | ICD-10-CM | POA: Diagnosis not present

## 2019-05-25 DIAGNOSIS — K580 Irritable bowel syndrome with diarrhea: Secondary | ICD-10-CM | POA: Diagnosis not present

## 2019-05-25 DIAGNOSIS — E871 Hypo-osmolality and hyponatremia: Secondary | ICD-10-CM | POA: Diagnosis not present

## 2019-05-28 DIAGNOSIS — J309 Allergic rhinitis, unspecified: Secondary | ICD-10-CM | POA: Diagnosis not present

## 2019-05-28 DIAGNOSIS — M810 Age-related osteoporosis without current pathological fracture: Secondary | ICD-10-CM | POA: Diagnosis not present

## 2019-05-28 DIAGNOSIS — R2681 Unsteadiness on feet: Secondary | ICD-10-CM | POA: Diagnosis not present

## 2019-05-28 DIAGNOSIS — M25561 Pain in right knee: Secondary | ICD-10-CM | POA: Diagnosis not present

## 2019-05-28 DIAGNOSIS — B0229 Other postherpetic nervous system involvement: Secondary | ICD-10-CM | POA: Diagnosis not present

## 2019-05-28 DIAGNOSIS — R1031 Right lower quadrant pain: Secondary | ICD-10-CM | POA: Diagnosis not present

## 2019-05-28 DIAGNOSIS — E871 Hypo-osmolality and hyponatremia: Secondary | ICD-10-CM | POA: Diagnosis not present

## 2019-05-28 DIAGNOSIS — K219 Gastro-esophageal reflux disease without esophagitis: Secondary | ICD-10-CM | POA: Diagnosis not present

## 2019-05-28 DIAGNOSIS — K580 Irritable bowel syndrome with diarrhea: Secondary | ICD-10-CM | POA: Diagnosis not present

## 2019-05-28 DIAGNOSIS — R600 Localized edema: Secondary | ICD-10-CM | POA: Diagnosis not present

## 2019-05-28 DIAGNOSIS — L299 Pruritus, unspecified: Secondary | ICD-10-CM | POA: Diagnosis not present

## 2019-06-04 DIAGNOSIS — R29898 Other symptoms and signs involving the musculoskeletal system: Secondary | ICD-10-CM | POA: Diagnosis not present

## 2019-06-04 DIAGNOSIS — R1031 Right lower quadrant pain: Secondary | ICD-10-CM | POA: Diagnosis not present

## 2019-06-04 DIAGNOSIS — J309 Allergic rhinitis, unspecified: Secondary | ICD-10-CM | POA: Diagnosis not present

## 2019-06-04 DIAGNOSIS — R2681 Unsteadiness on feet: Secondary | ICD-10-CM | POA: Diagnosis not present

## 2019-06-04 DIAGNOSIS — K219 Gastro-esophageal reflux disease without esophagitis: Secondary | ICD-10-CM | POA: Diagnosis not present

## 2019-06-04 DIAGNOSIS — B0229 Other postherpetic nervous system involvement: Secondary | ICD-10-CM | POA: Diagnosis not present

## 2019-06-04 DIAGNOSIS — E663 Overweight: Secondary | ICD-10-CM | POA: Diagnosis not present

## 2019-06-04 DIAGNOSIS — E871 Hypo-osmolality and hyponatremia: Secondary | ICD-10-CM | POA: Diagnosis not present

## 2019-06-04 DIAGNOSIS — M810 Age-related osteoporosis without current pathological fracture: Secondary | ICD-10-CM | POA: Diagnosis not present

## 2019-06-04 DIAGNOSIS — R600 Localized edema: Secondary | ICD-10-CM | POA: Diagnosis not present

## 2019-06-04 DIAGNOSIS — L299 Pruritus, unspecified: Secondary | ICD-10-CM | POA: Diagnosis not present

## 2019-06-04 DIAGNOSIS — K580 Irritable bowel syndrome with diarrhea: Secondary | ICD-10-CM | POA: Diagnosis not present

## 2019-06-13 DIAGNOSIS — H353211 Exudative age-related macular degeneration, right eye, with active choroidal neovascularization: Secondary | ICD-10-CM | POA: Diagnosis not present

## 2019-06-13 DIAGNOSIS — H353132 Nonexudative age-related macular degeneration, bilateral, intermediate dry stage: Secondary | ICD-10-CM | POA: Diagnosis not present

## 2019-06-13 DIAGNOSIS — H353212 Exudative age-related macular degeneration, right eye, with inactive choroidal neovascularization: Secondary | ICD-10-CM | POA: Diagnosis not present

## 2019-06-13 DIAGNOSIS — H43813 Vitreous degeneration, bilateral: Secondary | ICD-10-CM | POA: Diagnosis not present

## 2019-06-18 DIAGNOSIS — J309 Allergic rhinitis, unspecified: Secondary | ICD-10-CM | POA: Diagnosis not present

## 2019-06-18 DIAGNOSIS — K219 Gastro-esophageal reflux disease without esophagitis: Secondary | ICD-10-CM | POA: Diagnosis not present

## 2019-06-18 DIAGNOSIS — L299 Pruritus, unspecified: Secondary | ICD-10-CM | POA: Diagnosis not present

## 2019-06-18 DIAGNOSIS — R600 Localized edema: Secondary | ICD-10-CM | POA: Diagnosis not present

## 2019-06-18 DIAGNOSIS — K580 Irritable bowel syndrome with diarrhea: Secondary | ICD-10-CM | POA: Diagnosis not present

## 2019-06-18 DIAGNOSIS — R29898 Other symptoms and signs involving the musculoskeletal system: Secondary | ICD-10-CM | POA: Diagnosis not present

## 2019-06-18 DIAGNOSIS — I10 Essential (primary) hypertension: Secondary | ICD-10-CM | POA: Diagnosis not present

## 2019-06-18 DIAGNOSIS — R1031 Right lower quadrant pain: Secondary | ICD-10-CM | POA: Diagnosis not present

## 2019-06-18 DIAGNOSIS — E7800 Pure hypercholesterolemia, unspecified: Secondary | ICD-10-CM | POA: Diagnosis not present

## 2019-06-18 DIAGNOSIS — M810 Age-related osteoporosis without current pathological fracture: Secondary | ICD-10-CM | POA: Diagnosis not present

## 2019-06-18 DIAGNOSIS — R2681 Unsteadiness on feet: Secondary | ICD-10-CM | POA: Diagnosis not present

## 2019-06-18 DIAGNOSIS — D649 Anemia, unspecified: Secondary | ICD-10-CM | POA: Diagnosis not present

## 2019-06-18 DIAGNOSIS — B0229 Other postherpetic nervous system involvement: Secondary | ICD-10-CM | POA: Diagnosis not present

## 2019-06-18 DIAGNOSIS — E871 Hypo-osmolality and hyponatremia: Secondary | ICD-10-CM | POA: Diagnosis not present

## 2019-06-25 ENCOUNTER — Encounter (INDEPENDENT_AMBULATORY_CARE_PROVIDER_SITE_OTHER): Payer: Self-pay | Admitting: Ophthalmology

## 2019-06-25 ENCOUNTER — Other Ambulatory Visit: Payer: Self-pay

## 2019-06-25 ENCOUNTER — Ambulatory Visit (INDEPENDENT_AMBULATORY_CARE_PROVIDER_SITE_OTHER): Payer: PPO | Admitting: Ophthalmology

## 2019-06-25 DIAGNOSIS — H43812 Vitreous degeneration, left eye: Secondary | ICD-10-CM | POA: Diagnosis not present

## 2019-06-25 DIAGNOSIS — H353221 Exudative age-related macular degeneration, left eye, with active choroidal neovascularization: Secondary | ICD-10-CM

## 2019-06-25 DIAGNOSIS — H353211 Exudative age-related macular degeneration, right eye, with active choroidal neovascularization: Secondary | ICD-10-CM | POA: Diagnosis not present

## 2019-06-25 DIAGNOSIS — H43813 Vitreous degeneration, bilateral: Secondary | ICD-10-CM | POA: Diagnosis not present

## 2019-06-25 DIAGNOSIS — H353212 Exudative age-related macular degeneration, right eye, with inactive choroidal neovascularization: Secondary | ICD-10-CM | POA: Diagnosis not present

## 2019-06-25 DIAGNOSIS — H353132 Nonexudative age-related macular degeneration, bilateral, intermediate dry stage: Secondary | ICD-10-CM | POA: Insufficient documentation

## 2019-06-25 MED ORDER — BEVACIZUMAB CHEMO INJECTION 1.25MG/0.05ML SYRINGE FOR KALEIDOSCOPE
1.2500 mg | INTRAVITREAL | Status: AC | PRN
  Administered 2019-06-25: 1.25 mg via INTRAVITREAL

## 2019-06-25 NOTE — Progress Notes (Signed)
06/25/2019     CHIEF COMPLAINT Patient presents for Retina Follow Up   HISTORY OF PRESENT ILLNESS: Victoria Moran is a 84 y.o. female who presents to the clinic today for:   HPI    Retina Follow Up    Patient presents with  Wet AMD.  In left eye.  Duration of 2 weeks.  Since onset it is stable.          Comments     8 week follow up  OS   - OCT OU, Possible Avastin OS Patient denies change in vision and overall has no complaints.        Last edited by Hurman Horn, MD on 06/25/2019 10:11 AM. (History)      Referring physician: [name], MD [place],  [place] [id number]  HISTORICAL INFORMATION:   Selected notes from the MEDICAL RECORD NUMBER       CURRENT MEDICATIONS: No current outpatient medications on file. (Ophthalmic Drugs)   No current facility-administered medications for this visit. (Ophthalmic Drugs)   Current Outpatient Medications (Other)  Medication Sig  . amLODipine (NORVASC) 10 MG tablet Take 10 mg by mouth daily as needed.  . calcitRIOL (ROCALTROL) 0.25 MCG capsule Take 0.25 mcg by mouth daily.  . Calcium Carbonate-Vit D-Min (CALCIUM 600+D PLUS MINERALS PO) Take 1 tablet by mouth daily.  . clonazePAM (KLONOPIN) 1 MG tablet Take 1 mg by mouth every 8 (eight) hours as needed.  . clorazepate (TRANXENE) 7.5 MG tablet Take 7.5 mg by mouth at bedtime as needed for sleep.   . ferrous sulfate 325 (65 FE) MG EC tablet Take 325 mg by mouth daily.  . fluticasone (FLONASE) 50 MCG/ACT nasal spray Place 1 spray into both nostrils daily.  . furosemide (LASIX) 20 MG tablet Take 20 mg by mouth.  [name] lisinopril (ZESTRIL) 40 MG tablet Take 20 mg by mouth daily.  . LUTEIN PO Take 40 mg by mouth daily.  . metoprolol tartrate (LOPRESSOR) 100 MG tablet Take 100 mg by mouth 2 (two) times daily.  . Multiple Vitamins-Minerals (MACULAR VITAMIN BENEFIT) TABS Take 4 tablets by mouth daily.   . nabumetone (RELAFEN) 750 MG tablet Take 750 mg by mouth 2  (two) times daily as needed.   . predniSONE (DELTASONE) 5 MG tablet Take 5 mg by mouth every morning.   . promethazine (PHENERGAN) 25 MG tablet Take 25 mg by mouth every 6 (six) hours as needed for nausea or vomiting.  . Red Yeast Rice 600 MG TABS Take 1 tablet by mouth daily.  . traMADol (ULTRAM) 50 MG tablet Take 50 mg by mouth every 8 (eight) hours as needed.  . vitamin B-12 (CYANOCOBALAMIN) 1000 MCG tablet Take 1,000 mcg by mouth daily.  . vitamin E 1000 UNIT capsule Take 1,000 Units by mouth daily.   No current facility-administered medications for this visit. (Other)      REVIEW OF SYSTEMS:    ALLERGIES Allergies  Allergen Reactions  . Cephalosporins Diarrhea  . Codeine Nausea Only    PAST MEDICAL HISTORY Past Medical History:  Diagnosis Date  . Anemia   . Anxiety   . Arthritis   . Cancer ([place])    hx bilateral breast cancer  . Constipation   . Cystic mastopathy   . Edema of extremities   . Esophageal reflux   . History of breast cancer oncologist--  [place] cancer caner--  no recurrence   dx 2009--  DCIS (cT1  N0 M0) s/p  right total mastectomy//  2014--  low grade DCIS  ---s/p left mastectomy//    no chemo or radiation  . Hypercholesteremia   . Hypertension   . Hypertension   . Hyponatremia   . IBS (irritable bowel syndrome)   . Insomnia   . Insomnia   . Lumbago   . Macular degeneration of both eyes   . Muscle cramping   . Osteoarthritis   . Osteopenia   . Postherpetic neuralgia   . Pruritic disorder   . Stage 4 chronic kidney disease ([place])   . Swelling of both ankles   . Tendinopathy of left gluteus medius    hip  . Weakness    Past Surgical History:  Procedure Laterality Date  . CATARACT EXTRACTION W/ INTRAOCULAR LENS  IMPLANT, BILATERAL    . DECOMPRESSIVE LUMBAR LAMINECTOMY LEVEL 2  03/08/2012   Procedure: DECOMPRESSIVE LUMBAR LAMINECTOMY LEVEL 2;  Surgeon: Melina Schools, MD;  Location: [facility];  Service: Orthopedics;  Laterality: N/A;   Decompression L3-L5   . ELBOW SURGERY Left 2003  . EXCISION/RELEASE BURSA HIP Left 03/18/2015   Procedure: LEFT HIP BURSECTOMY WITH GLUTEAL TENDON REPAIR;  Surgeon: Gaynelle Arabian, MD;  [facility];  Service: Orthopedics;  Laterality: Left;  . KNEE ARTHROSCOPY Left   . LEFT FOOT SURGERY  01-09-2008   left great toe and left second toe  . MASTECTOMY W/ SENTINEL NODE BIOPSY Left 07/04/2012   Procedure: MASTECTOMY WITH SENTINEL LYMPH NODE BIOPSY;  Surgeon: Stark Klein, MD;  Location: [facility];  Service: General;  Laterality: Left;  . OPEN SURGICAL REPAIR OF GLUTEAL TENDON Right 09/27/2013   Procedure: RIGHT HIP BURSECTOMY/GLUTEAL TENDON REPAIR;  Surgeon: Gearlean Alf, MD;  [facility];  Service: Orthopedics;  Laterality: Right;  . ROTATOR CUFF REPAIR Right ?2005  . TOTAL MASTECTOMY Right 08-08-2007   w/  SLN bx    FAMILY HISTORY Family History  Problem Relation Age of Onset  . Heart attack Mother   . Hypertension Mother   . Congestive Heart Failure Father   . Pneumonia Father     SOCIAL HISTORY Social History   Tobacco Use  . Smoking status: Never Smoker  . Smokeless tobacco: Never Used  Substance Use Topics  . Alcohol use: No  . Drug use: No         OPHTHALMIC EXAM:  Base Eye Exam    Visual Acuity (Snellen - Linear)      Right Left   Dist [place] 20/40 20/40-2   Dist ph [place] NI NI       Tonometry (Tonopen, 9:11 AM)      Right Left   Pressure 10 11       Pupils      Pupils Dark Light Shape React APD   Right PERRL 3 2 Round Minimal None   Left PERRL 4 3 Round Brisk None       Visual Fields (Counting fingers)      Left Right    Full Full       Extraocular Movement      Right Left    Full Full       Neuro/Psych    Oriented x3: Yes   Mood/Affect: Normal       Dilation    Left eye: 1.0% Mydriacyl, 2.5% Phenylephrine @ 9:11 AM        Slit Lamp and Fundus Exam    External Exam  Right Left   External Normal Normal        Slit Lamp Exam      Right Left   Lids/Lashes Normal Normal   Conjunctiva/Sclera White and quiet White and quiet   Cornea Clear Clear   Anterior Chamber Deep and quiet Deep and quiet   Iris Round and reactive Round and reactive   Lens Posterior chamber intraocular lens Posterior chamber intraocular lens   Vitreous Normal Normal          IMAGING AND PROCEDURES  Imaging and Procedures for @TODAY @  OCT, Retina - OU - Both Eyes       Right Eye Quality was good. Scan locations included subfoveal. Findings include cystoid macular edema, choroidal neovascular membrane.   Left Eye Quality was good. Scan locations included subfoveal. Progression has improved. Findings include abnormal foveal contour, cystoid macular edema, subretinal fluid, choroidal neovascular membrane.   Notes OD now some 2 weeks status post Avastin with chronic active CN VM and CME nasal to the foveal avascular zone  OS, with much less,  Paramacular thickening and fluid from CN VM,, at 8-week interval.  Repeat Avastin today and exam in 8 weeks       Intravitreal Injection, Pharmacologic Agent - OS - Left Eye       Time Out 06/25/2019. 10:14 AM. Confirmed correct patient, procedure, site, and patient consented.   Anesthesia Topical anesthesia was used. Anesthetic medications included [place] 3.5%.   Procedure Preparation included 10% betadine to eyelids, Ofloxacin . A 30 gauge needle was used.   Injection:  1.25 mg Bevacizumab (AVASTIN) SOLN   NDC: [address]-6, Lot: [id number]   Route: Intravitreal, Site: Left Eye, Waste: 0 mg  Post-op Post injection exam found visual acuity of at least counting fingers. The patient tolerated the procedure well. There were no complications. The patient received written and verbal post procedure care education. Post injection medications were not given.                 ASSESSMENT/PLAN:  @PROBAPNOTE @    ICD-10-CM   1. Exudative age-related macular degeneration of  left eye with active choroidal neovascularization (HCC)  H35.3221 OCT, Retina - OU - Both Eyes    Intravitreal Injection, Pharmacologic Agent - OS - Left Eye    Bevacizumab (AVASTIN) SOLN 1.25 mg  2. Exudative age-related macular degeneration of right eye with active choroidal neovascularization ([place])  H35.3211   3. Exudative age-related macular degeneration of right eye with inactive choroidal neovascularization ([place])  H35.3212   4. Intermediate stage nonexudative age-related macular degeneration of both eyes  H35.3132   5. Posterior vitreous detachment of both eyes  H43.813 OCT, Retina - OU - Both Eyes  6. Posterior vitreous detachment of left eye  H43.812     1.  2.  3.  Ophthalmic Meds Ordered this visit:  Meds ordered this encounter  Medications  . Bevacizumab (AVASTIN) SOLN 1.25 mg       Return in about 8 weeks (around 08/20/2019) for AVASTIN OCT, OS.  There are no Patient Instructions on file for this visit.   Explained the diagnoses, plan, and follow up with the patient and they expressed understanding.  Patient expressed understanding of the importance of proper follow up care.   Clent Demark Taven Strite M.D. Diseases & Surgery of the Retina and Vitreous Retina & Diabetic [place] @TODAY @     Abbreviations: M myopia (nearsighted); A astigmatism; H hyperopia (farsighted); P presbyopia; Mrx spectacle prescription;  CTL contact lenses;  OD right eye; OS left eye; OU both eyes  XT exotropia; ET esotropia; PEK punctate epithelial keratitis; PEE punctate epithelial erosions; DES dry eye syndrome; MGD meibomian gland dysfunction; ATs artificial tears; PFAT's preservative free artificial tears; [place] nuclear sclerotic cataract; PSC posterior subcapsular cataract; ERM epi-retinal membrane; PVD posterior vitreous detachment; RD retinal detachment; DM diabetes mellitus; DR diabetic retinopathy; NPDR non-proliferative diabetic retinopathy; PDR proliferative diabetic retinopathy; CSME  clinically significant macular edema; DME diabetic macular edema; dbh dot blot hemorrhages; CWS cotton wool spot; POAG primary open angle glaucoma; C/D cup-to-disc ratio; HVF humphrey visual field; GVF goldmann visual field; OCT optical coherence tomography; IOP intraocular pressure; BRVO Branch retinal vein occlusion; CRVO central retinal vein occlusion; CRAO central retinal artery occlusion; BRAO branch retinal artery occlusion; RT retinal tear; SB scleral buckle; PPV pars plana vitrectomy; VH Vitreous hemorrhage; PRP panretinal laser photocoagulation; IVK intravitreal kenalog; VMT vitreomacular traction; MH Macular hole;  NVD neovascularization of the disc; NVE neovascularization elsewhere; AREDS age related eye disease study; ARMD age related macular degeneration; POAG primary open angle glaucoma; EBMD epithelial/anterior basement membrane dystrophy; ACIOL anterior chamber intraocular lens; IOL intraocular lens; PCIOL posterior chamber intraocular lens; Phaco/IOL phacoemulsification with intraocular lens placement; [place] photorefractive keratectomy; LASIK laser assisted in situ keratomileusis; HTN hypertension; DM diabetes mellitus; COPD chronic obstructive pulmonary disease

## 2019-07-04 DIAGNOSIS — R29898 Other symptoms and signs involving the musculoskeletal system: Secondary | ICD-10-CM | POA: Diagnosis not present

## 2019-07-04 DIAGNOSIS — M810 Age-related osteoporosis without current pathological fracture: Secondary | ICD-10-CM | POA: Diagnosis not present

## 2019-07-04 DIAGNOSIS — R2681 Unsteadiness on feet: Secondary | ICD-10-CM | POA: Diagnosis not present

## 2019-07-04 DIAGNOSIS — K219 Gastro-esophageal reflux disease without esophagitis: Secondary | ICD-10-CM | POA: Diagnosis not present

## 2019-07-04 DIAGNOSIS — R600 Localized edema: Secondary | ICD-10-CM | POA: Diagnosis not present

## 2019-07-04 DIAGNOSIS — K580 Irritable bowel syndrome with diarrhea: Secondary | ICD-10-CM | POA: Diagnosis not present

## 2019-07-04 DIAGNOSIS — L299 Pruritus, unspecified: Secondary | ICD-10-CM | POA: Diagnosis not present

## 2019-07-04 DIAGNOSIS — R1031 Right lower quadrant pain: Secondary | ICD-10-CM | POA: Diagnosis not present

## 2019-07-04 DIAGNOSIS — E871 Hypo-osmolality and hyponatremia: Secondary | ICD-10-CM | POA: Diagnosis not present

## 2019-07-04 DIAGNOSIS — B0229 Other postherpetic nervous system involvement: Secondary | ICD-10-CM | POA: Diagnosis not present

## 2019-07-04 DIAGNOSIS — J309 Allergic rhinitis, unspecified: Secondary | ICD-10-CM | POA: Diagnosis not present

## 2019-07-04 DIAGNOSIS — L0390 Cellulitis, unspecified: Secondary | ICD-10-CM | POA: Diagnosis not present

## 2019-07-11 DIAGNOSIS — E663 Overweight: Secondary | ICD-10-CM | POA: Diagnosis not present

## 2019-07-11 DIAGNOSIS — J309 Allergic rhinitis, unspecified: Secondary | ICD-10-CM | POA: Diagnosis not present

## 2019-07-11 DIAGNOSIS — Z6825 Body mass index (BMI) 25.0-25.9, adult: Secondary | ICD-10-CM | POA: Diagnosis not present

## 2019-07-11 DIAGNOSIS — E871 Hypo-osmolality and hyponatremia: Secondary | ICD-10-CM | POA: Diagnosis not present

## 2019-07-11 DIAGNOSIS — R2681 Unsteadiness on feet: Secondary | ICD-10-CM | POA: Diagnosis not present

## 2019-07-11 DIAGNOSIS — R600 Localized edema: Secondary | ICD-10-CM | POA: Diagnosis not present

## 2019-07-11 DIAGNOSIS — L299 Pruritus, unspecified: Secondary | ICD-10-CM | POA: Diagnosis not present

## 2019-07-11 DIAGNOSIS — K219 Gastro-esophageal reflux disease without esophagitis: Secondary | ICD-10-CM | POA: Diagnosis not present

## 2019-07-11 DIAGNOSIS — L0291 Cutaneous abscess, unspecified: Secondary | ICD-10-CM | POA: Diagnosis not present

## 2019-07-11 DIAGNOSIS — M810 Age-related osteoporosis without current pathological fracture: Secondary | ICD-10-CM | POA: Diagnosis not present

## 2019-07-11 DIAGNOSIS — R1031 Right lower quadrant pain: Secondary | ICD-10-CM | POA: Diagnosis not present

## 2019-07-11 DIAGNOSIS — B0229 Other postherpetic nervous system involvement: Secondary | ICD-10-CM | POA: Diagnosis not present

## 2019-07-15 DIAGNOSIS — K580 Irritable bowel syndrome with diarrhea: Secondary | ICD-10-CM | POA: Diagnosis not present

## 2019-07-15 DIAGNOSIS — B0229 Other postherpetic nervous system involvement: Secondary | ICD-10-CM | POA: Diagnosis not present

## 2019-07-15 DIAGNOSIS — E871 Hypo-osmolality and hyponatremia: Secondary | ICD-10-CM | POA: Diagnosis not present

## 2019-07-15 DIAGNOSIS — M810 Age-related osteoporosis without current pathological fracture: Secondary | ICD-10-CM | POA: Diagnosis not present

## 2019-07-15 DIAGNOSIS — J309 Allergic rhinitis, unspecified: Secondary | ICD-10-CM | POA: Diagnosis not present

## 2019-07-15 DIAGNOSIS — R2681 Unsteadiness on feet: Secondary | ICD-10-CM | POA: Diagnosis not present

## 2019-07-15 DIAGNOSIS — R600 Localized edema: Secondary | ICD-10-CM | POA: Diagnosis not present

## 2019-07-15 DIAGNOSIS — R1031 Right lower quadrant pain: Secondary | ICD-10-CM | POA: Diagnosis not present

## 2019-07-15 DIAGNOSIS — K219 Gastro-esophageal reflux disease without esophagitis: Secondary | ICD-10-CM | POA: Diagnosis not present

## 2019-07-15 DIAGNOSIS — L299 Pruritus, unspecified: Secondary | ICD-10-CM | POA: Diagnosis not present

## 2019-07-15 DIAGNOSIS — L0291 Cutaneous abscess, unspecified: Secondary | ICD-10-CM | POA: Diagnosis not present

## 2019-07-20 DIAGNOSIS — N390 Urinary tract infection, site not specified: Secondary | ICD-10-CM | POA: Diagnosis not present

## 2019-07-20 DIAGNOSIS — N189 Chronic kidney disease, unspecified: Secondary | ICD-10-CM | POA: Diagnosis not present

## 2019-07-20 DIAGNOSIS — N184 Chronic kidney disease, stage 4 (severe): Secondary | ICD-10-CM | POA: Diagnosis not present

## 2019-07-20 DIAGNOSIS — I160 Hypertensive urgency: Secondary | ICD-10-CM | POA: Diagnosis not present

## 2019-07-20 DIAGNOSIS — S8261XD Displaced fracture of lateral malleolus of right fibula, subsequent encounter for closed fracture with routine healing: Secondary | ICD-10-CM | POA: Diagnosis not present

## 2019-07-20 DIAGNOSIS — Z79899 Other long term (current) drug therapy: Secondary | ICD-10-CM | POA: Diagnosis not present

## 2019-07-20 DIAGNOSIS — F329 Major depressive disorder, single episode, unspecified: Secondary | ICD-10-CM | POA: Diagnosis not present

## 2019-07-20 DIAGNOSIS — I129 Hypertensive chronic kidney disease with stage 1 through stage 4 chronic kidney disease, or unspecified chronic kidney disease: Secondary | ICD-10-CM | POA: Diagnosis not present

## 2019-07-20 DIAGNOSIS — S8264XA Nondisplaced fracture of lateral malleolus of right fibula, initial encounter for closed fracture: Secondary | ICD-10-CM | POA: Diagnosis not present

## 2019-07-20 DIAGNOSIS — M6281 Muscle weakness (generalized): Secondary | ICD-10-CM | POA: Diagnosis not present

## 2019-07-20 DIAGNOSIS — R278 Other lack of coordination: Secondary | ICD-10-CM | POA: Diagnosis not present

## 2019-07-20 DIAGNOSIS — Z853 Personal history of malignant neoplasm of breast: Secondary | ICD-10-CM | POA: Diagnosis not present

## 2019-07-20 DIAGNOSIS — Z4789 Encounter for other orthopedic aftercare: Secondary | ICD-10-CM | POA: Diagnosis not present

## 2019-07-20 DIAGNOSIS — D649 Anemia, unspecified: Secondary | ICD-10-CM | POA: Diagnosis not present

## 2019-07-20 DIAGNOSIS — I340 Nonrheumatic mitral (valve) insufficiency: Secondary | ICD-10-CM | POA: Diagnosis not present

## 2019-07-20 DIAGNOSIS — R2689 Other abnormalities of gait and mobility: Secondary | ICD-10-CM | POA: Diagnosis not present

## 2019-07-20 DIAGNOSIS — K219 Gastro-esophageal reflux disease without esophagitis: Secondary | ICD-10-CM | POA: Diagnosis not present

## 2019-07-20 DIAGNOSIS — S89291A Other physeal fracture of upper end of right fibula, initial encounter for closed fracture: Secondary | ICD-10-CM | POA: Diagnosis not present

## 2019-07-20 DIAGNOSIS — Z7952 Long term (current) use of systemic steroids: Secondary | ICD-10-CM | POA: Diagnosis not present

## 2019-07-20 DIAGNOSIS — R262 Difficulty in walking, not elsewhere classified: Secondary | ICD-10-CM | POA: Diagnosis not present

## 2019-07-20 DIAGNOSIS — R531 Weakness: Secondary | ICD-10-CM | POA: Diagnosis not present

## 2019-07-20 DIAGNOSIS — I10 Essential (primary) hypertension: Secondary | ICD-10-CM | POA: Diagnosis not present

## 2019-07-20 DIAGNOSIS — S82831A Other fracture of upper and lower end of right fibula, initial encounter for closed fracture: Secondary | ICD-10-CM | POA: Diagnosis not present

## 2019-07-20 DIAGNOSIS — S299XXA Unspecified injury of thorax, initial encounter: Secondary | ICD-10-CM | POA: Diagnosis not present

## 2019-07-20 DIAGNOSIS — I361 Nonrheumatic tricuspid (valve) insufficiency: Secondary | ICD-10-CM | POA: Diagnosis not present

## 2019-07-20 DIAGNOSIS — W19XXXD Unspecified fall, subsequent encounter: Secondary | ICD-10-CM | POA: Diagnosis not present

## 2019-07-20 DIAGNOSIS — Z885 Allergy status to narcotic agent status: Secondary | ICD-10-CM | POA: Diagnosis not present

## 2019-07-20 DIAGNOSIS — E0590 Thyrotoxicosis, unspecified without thyrotoxic crisis or storm: Secondary | ICD-10-CM | POA: Diagnosis not present

## 2019-07-20 DIAGNOSIS — R69 Illness, unspecified: Secondary | ICD-10-CM | POA: Diagnosis not present

## 2019-07-20 DIAGNOSIS — W19XXXA Unspecified fall, initial encounter: Secondary | ICD-10-CM | POA: Diagnosis not present

## 2019-07-20 DIAGNOSIS — M1990 Unspecified osteoarthritis, unspecified site: Secondary | ICD-10-CM | POA: Diagnosis not present

## 2019-07-20 DIAGNOSIS — Z743 Need for continuous supervision: Secondary | ICD-10-CM | POA: Diagnosis not present

## 2019-07-20 DIAGNOSIS — R41841 Cognitive communication deficit: Secondary | ICD-10-CM | POA: Diagnosis not present

## 2019-07-23 ENCOUNTER — Encounter (INDEPENDENT_AMBULATORY_CARE_PROVIDER_SITE_OTHER): Payer: PPO | Admitting: Ophthalmology

## 2019-07-25 DIAGNOSIS — R52 Pain, unspecified: Secondary | ICD-10-CM | POA: Diagnosis not present

## 2019-07-25 DIAGNOSIS — Z4789 Encounter for other orthopedic aftercare: Secondary | ICD-10-CM | POA: Diagnosis not present

## 2019-07-25 DIAGNOSIS — S82831D Other fracture of upper and lower end of right fibula, subsequent encounter for closed fracture with routine healing: Secondary | ICD-10-CM | POA: Diagnosis not present

## 2019-07-25 DIAGNOSIS — A0472 Enterocolitis due to Clostridium difficile, not specified as recurrent: Secondary | ICD-10-CM | POA: Diagnosis not present

## 2019-07-25 DIAGNOSIS — R69 Illness, unspecified: Secondary | ICD-10-CM | POA: Diagnosis not present

## 2019-07-25 DIAGNOSIS — S8263XA Displaced fracture of lateral malleolus of unspecified fibula, initial encounter for closed fracture: Secondary | ICD-10-CM | POA: Diagnosis not present

## 2019-07-25 DIAGNOSIS — R278 Other lack of coordination: Secondary | ICD-10-CM | POA: Diagnosis not present

## 2019-07-25 DIAGNOSIS — M6281 Muscle weakness (generalized): Secondary | ICD-10-CM | POA: Diagnosis not present

## 2019-07-25 DIAGNOSIS — R41841 Cognitive communication deficit: Secondary | ICD-10-CM | POA: Diagnosis not present

## 2019-07-25 DIAGNOSIS — S8261XD Displaced fracture of lateral malleolus of right fibula, subsequent encounter for closed fracture with routine healing: Secondary | ICD-10-CM | POA: Diagnosis not present

## 2019-07-25 DIAGNOSIS — K4090 Unilateral inguinal hernia, without obstruction or gangrene, not specified as recurrent: Secondary | ICD-10-CM | POA: Diagnosis not present

## 2019-07-25 DIAGNOSIS — R5381 Other malaise: Secondary | ICD-10-CM | POA: Diagnosis not present

## 2019-07-25 DIAGNOSIS — D649 Anemia, unspecified: Secondary | ICD-10-CM | POA: Diagnosis not present

## 2019-07-25 DIAGNOSIS — W19XXXD Unspecified fall, subsequent encounter: Secondary | ICD-10-CM | POA: Diagnosis not present

## 2019-07-25 DIAGNOSIS — R0902 Hypoxemia: Secondary | ICD-10-CM | POA: Diagnosis not present

## 2019-07-25 DIAGNOSIS — R55 Syncope and collapse: Secondary | ICD-10-CM | POA: Diagnosis not present

## 2019-07-25 DIAGNOSIS — R4020 Unspecified coma: Secondary | ICD-10-CM | POA: Diagnosis not present

## 2019-07-25 DIAGNOSIS — F4323 Adjustment disorder with mixed anxiety and depressed mood: Secondary | ICD-10-CM | POA: Diagnosis not present

## 2019-07-25 DIAGNOSIS — R2689 Other abnormalities of gait and mobility: Secondary | ICD-10-CM | POA: Diagnosis not present

## 2019-07-25 DIAGNOSIS — Z743 Need for continuous supervision: Secondary | ICD-10-CM | POA: Diagnosis not present

## 2019-07-25 DIAGNOSIS — R531 Weakness: Secondary | ICD-10-CM | POA: Diagnosis not present

## 2019-07-25 DIAGNOSIS — R262 Difficulty in walking, not elsewhere classified: Secondary | ICD-10-CM | POA: Diagnosis not present

## 2019-07-25 DIAGNOSIS — I10 Essential (primary) hypertension: Secondary | ICD-10-CM | POA: Diagnosis not present

## 2019-07-25 DIAGNOSIS — S91101A Unspecified open wound of right great toe without damage to nail, initial encounter: Secondary | ICD-10-CM | POA: Diagnosis not present

## 2019-07-25 DIAGNOSIS — M810 Age-related osteoporosis without current pathological fracture: Secondary | ICD-10-CM | POA: Diagnosis not present

## 2019-07-25 DIAGNOSIS — S91101D Unspecified open wound of right great toe without damage to nail, subsequent encounter: Secondary | ICD-10-CM | POA: Diagnosis not present

## 2019-07-25 DIAGNOSIS — R001 Bradycardia, unspecified: Secondary | ICD-10-CM | POA: Diagnosis not present

## 2019-07-25 DIAGNOSIS — S199XXA Unspecified injury of neck, initial encounter: Secondary | ICD-10-CM | POA: Diagnosis not present

## 2019-07-25 DIAGNOSIS — R109 Unspecified abdominal pain: Secondary | ICD-10-CM | POA: Diagnosis not present

## 2019-07-25 DIAGNOSIS — N184 Chronic kidney disease, stage 4 (severe): Secondary | ICD-10-CM | POA: Diagnosis not present

## 2019-07-25 DIAGNOSIS — F329 Major depressive disorder, single episode, unspecified: Secondary | ICD-10-CM | POA: Diagnosis not present

## 2019-07-25 DIAGNOSIS — R197 Diarrhea, unspecified: Secondary | ICD-10-CM | POA: Diagnosis not present

## 2019-07-25 DIAGNOSIS — I959 Hypotension, unspecified: Secondary | ICD-10-CM | POA: Diagnosis not present

## 2019-07-25 DIAGNOSIS — S0990XA Unspecified injury of head, initial encounter: Secondary | ICD-10-CM | POA: Diagnosis not present

## 2019-07-25 DIAGNOSIS — R112 Nausea with vomiting, unspecified: Secondary | ICD-10-CM | POA: Diagnosis not present

## 2019-07-27 DIAGNOSIS — M810 Age-related osteoporosis without current pathological fracture: Secondary | ICD-10-CM | POA: Diagnosis not present

## 2019-07-27 DIAGNOSIS — S82831D Other fracture of upper and lower end of right fibula, subsequent encounter for closed fracture with routine healing: Secondary | ICD-10-CM | POA: Diagnosis not present

## 2019-07-27 DIAGNOSIS — R262 Difficulty in walking, not elsewhere classified: Secondary | ICD-10-CM | POA: Diagnosis not present

## 2019-07-27 DIAGNOSIS — D649 Anemia, unspecified: Secondary | ICD-10-CM | POA: Diagnosis not present

## 2019-07-30 DIAGNOSIS — S91101A Unspecified open wound of right great toe without damage to nail, initial encounter: Secondary | ICD-10-CM | POA: Diagnosis not present

## 2019-08-02 DIAGNOSIS — R55 Syncope and collapse: Secondary | ICD-10-CM | POA: Diagnosis not present

## 2019-08-02 DIAGNOSIS — K4090 Unilateral inguinal hernia, without obstruction or gangrene, not specified as recurrent: Secondary | ICD-10-CM | POA: Diagnosis not present

## 2019-08-02 DIAGNOSIS — S199XXA Unspecified injury of neck, initial encounter: Secondary | ICD-10-CM | POA: Diagnosis not present

## 2019-08-02 DIAGNOSIS — S0990XA Unspecified injury of head, initial encounter: Secondary | ICD-10-CM | POA: Diagnosis not present

## 2019-08-02 DIAGNOSIS — R109 Unspecified abdominal pain: Secondary | ICD-10-CM | POA: Diagnosis not present

## 2019-08-06 DIAGNOSIS — S91101D Unspecified open wound of right great toe without damage to nail, subsequent encounter: Secondary | ICD-10-CM | POA: Diagnosis not present

## 2019-08-13 DIAGNOSIS — S91101D Unspecified open wound of right great toe without damage to nail, subsequent encounter: Secondary | ICD-10-CM | POA: Diagnosis not present

## 2019-08-20 ENCOUNTER — Encounter (INDEPENDENT_AMBULATORY_CARE_PROVIDER_SITE_OTHER): Payer: PPO | Admitting: Ophthalmology

## 2019-08-20 ENCOUNTER — Encounter (INDEPENDENT_AMBULATORY_CARE_PROVIDER_SITE_OTHER): Payer: Self-pay

## 2019-08-20 DIAGNOSIS — S91101D Unspecified open wound of right great toe without damage to nail, subsequent encounter: Secondary | ICD-10-CM | POA: Diagnosis not present

## 2019-08-28 DIAGNOSIS — I509 Heart failure, unspecified: Secondary | ICD-10-CM | POA: Diagnosis not present

## 2019-08-28 DIAGNOSIS — M2550 Pain in unspecified joint: Secondary | ICD-10-CM | POA: Diagnosis not present

## 2019-08-28 DIAGNOSIS — N1831 Chronic kidney disease, stage 3a: Secondary | ICD-10-CM | POA: Diagnosis not present

## 2019-08-28 DIAGNOSIS — I119 Hypertensive heart disease without heart failure: Secondary | ICD-10-CM | POA: Diagnosis not present

## 2019-09-12 DIAGNOSIS — F4323 Adjustment disorder with mixed anxiety and depressed mood: Secondary | ICD-10-CM | POA: Diagnosis not present

## 2019-09-20 DIAGNOSIS — S8263XA Displaced fracture of lateral malleolus of unspecified fibula, initial encounter for closed fracture: Secondary | ICD-10-CM | POA: Diagnosis not present

## 2019-09-20 DIAGNOSIS — S8261XD Displaced fracture of lateral malleolus of right fibula, subsequent encounter for closed fracture with routine healing: Secondary | ICD-10-CM | POA: Diagnosis not present

## 2019-09-26 ENCOUNTER — Encounter (INDEPENDENT_AMBULATORY_CARE_PROVIDER_SITE_OTHER): Payer: Self-pay | Admitting: Ophthalmology

## 2019-09-26 ENCOUNTER — Ambulatory Visit (INDEPENDENT_AMBULATORY_CARE_PROVIDER_SITE_OTHER): Payer: PPO | Admitting: Ophthalmology

## 2019-09-26 ENCOUNTER — Other Ambulatory Visit: Payer: Self-pay

## 2019-09-26 DIAGNOSIS — H353211 Exudative age-related macular degeneration, right eye, with active choroidal neovascularization: Secondary | ICD-10-CM | POA: Diagnosis not present

## 2019-09-26 DIAGNOSIS — N1831 Chronic kidney disease, stage 3a: Secondary | ICD-10-CM | POA: Diagnosis not present

## 2019-09-26 DIAGNOSIS — K219 Gastro-esophageal reflux disease without esophagitis: Secondary | ICD-10-CM | POA: Diagnosis not present

## 2019-09-26 DIAGNOSIS — D649 Anemia, unspecified: Secondary | ICD-10-CM | POA: Diagnosis not present

## 2019-09-26 DIAGNOSIS — H353221 Exudative age-related macular degeneration, left eye, with active choroidal neovascularization: Secondary | ICD-10-CM

## 2019-09-26 DIAGNOSIS — M2550 Pain in unspecified joint: Secondary | ICD-10-CM | POA: Diagnosis not present

## 2019-09-26 NOTE — Progress Notes (Signed)
09/26/2019     CHIEF COMPLAINT Patient presents for Retina Follow Up   HISTORY OF PRESENT ILLNESS: Victoria Moran is a 84 y.o. female who presents to the clinic today for:   HPI    Retina Follow Up    Patient presents with  Wet AMD.  In left eye.  This started 3 months ago.  Duration of 3 months.  Since onset it is stable.          Comments    3 month f/u possible Avastin OS, dilated exam and OCT(MAC) today.  Pt denies noticeable changes to [place] OU since last visit. Pt denies ocular pain, flashes of light, or floaters OU.         Last edited by Melburn Popper, COA on 09/26/2019  9:54 AM. (History)      Referring physician: [name], MD [place],  [place] [address]  HISTORICAL INFORMATION:   Selected notes from the MEDICAL RECORD NUMBER       CURRENT MEDICATIONS: No current outpatient medications on file. (Ophthalmic Drugs)   No current facility-administered medications for this visit. (Ophthalmic Drugs)   Current Outpatient Medications (Other)  Medication Sig  . amLODipine (NORVASC) 10 MG tablet Take 10 mg by mouth daily as needed.  . calcitRIOL (ROCALTROL) 0.25 MCG capsule Take 0.25 mcg by mouth daily.  . Calcium Carbonate-Vit D-Min (CALCIUM 600+D PLUS MINERALS PO) Take 1 tablet by mouth daily.  . clonazePAM (KLONOPIN) 1 MG tablet Take 1 mg by mouth every 8 (eight) hours as needed.  . clorazepate (TRANXENE) 7.5 MG tablet Take 7.5 mg by mouth at bedtime as needed for sleep.   . ferrous sulfate 325 (65 FE) MG EC tablet Take 325 mg by mouth daily.  . fluticasone (FLONASE) 50 MCG/ACT nasal spray Place 1 spray into both nostrils daily.  . furosemide (LASIX) 20 MG tablet Take 20 mg by mouth.  [name] lisinopril (ZESTRIL) 40 MG tablet Take 20 mg by mouth daily.  . LUTEIN PO Take 40 mg by mouth daily.  . metoprolol tartrate (LOPRESSOR) 100 MG tablet Take 100 mg by mouth 2 (two) times daily.  . Multiple Vitamins-Minerals (MACULAR VITAMIN BENEFIT)  TABS Take 4 tablets by mouth daily.   . nabumetone (RELAFEN) 750 MG tablet Take 750 mg by mouth 2 (two) times daily as needed.   . predniSONE (DELTASONE) 5 MG tablet Take 5 mg by mouth every morning.   . promethazine (PHENERGAN) 25 MG tablet Take 25 mg by mouth every 6 (six) hours as needed for nausea or vomiting.  . Red Yeast Rice 600 MG TABS Take 1 tablet by mouth daily.  . traMADol (ULTRAM) 50 MG tablet Take 50 mg by mouth every 8 (eight) hours as needed.  . vitamin B-12 (CYANOCOBALAMIN) 1000 MCG tablet Take 1,000 mcg by mouth daily.  . vitamin E 1000 UNIT capsule Take 1,000 Units by mouth daily.   No current facility-administered medications for this visit. (Other)      REVIEW OF SYSTEMS:    ALLERGIES Allergies  Allergen Reactions  . Cephalosporins Diarrhea  . Codeine Nausea Only    PAST MEDICAL HISTORY Past Medical History:  Diagnosis Date  . Anemia   . Anxiety   . Arthritis   . Cancer ([place])    hx bilateral breast cancer  . Constipation   . Cystic mastopathy   . Edema of extremities   . Esophageal reflux   . History of breast cancer  oncologist--  [place] cancer caner--  no recurrence   dx 2009--  DCIS (cT1 N0 M0) s/p  right total mastectomy//  2014--  low grade DCIS  ---s/p left mastectomy//    no chemo or radiation  . Hypercholesteremia   . Hypertension   . Hypertension   . Hyponatremia   . IBS (irritable bowel syndrome)   . Insomnia   . Insomnia   . Lumbago   . Macular degeneration of both eyes   . Muscle cramping   . Osteoarthritis   . Osteopenia   . Postherpetic neuralgia   . Pruritic disorder   . Stage 4 chronic kidney disease ([place])   . Swelling of both ankles   . Tendinopathy of left gluteus medius    hip  . Weakness    Past Surgical History:  Procedure Laterality Date  . CATARACT EXTRACTION W/ INTRAOCULAR LENS  IMPLANT, BILATERAL    . DECOMPRESSIVE LUMBAR LAMINECTOMY LEVEL 2  03/08/2012   Procedure: DECOMPRESSIVE LUMBAR LAMINECTOMY LEVEL 2;   Surgeon: Melina Schools, MD;  Location: [facility];  Service: Orthopedics;  Laterality: N/A;  Decompression L3-L5   . ELBOW SURGERY Left 2003  . EXCISION/RELEASE BURSA HIP Left 03/18/2015   Procedure: LEFT HIP BURSECTOMY WITH GLUTEAL TENDON REPAIR;  Surgeon: Gaynelle Arabian, MD;  [facility];  Service: Orthopedics;  Laterality: Left;  . KNEE ARTHROSCOPY Left   . LEFT FOOT SURGERY  01-09-2008   left great toe and left second toe  . MASTECTOMY W/ SENTINEL NODE BIOPSY Left 07/04/2012   Procedure: MASTECTOMY WITH SENTINEL LYMPH NODE BIOPSY;  Surgeon: Stark Klein, MD;  Location: [facility];  Service: General;  Laterality: Left;  . OPEN SURGICAL REPAIR OF GLUTEAL TENDON Right 09/27/2013   Procedure: RIGHT HIP BURSECTOMY/GLUTEAL TENDON REPAIR;  Surgeon: Gearlean Alf, MD;  [facility];  Service: Orthopedics;  Laterality: Right;  . ROTATOR CUFF REPAIR Right ?2005  . TOTAL MASTECTOMY Right 08-08-2007   w/  SLN bx    FAMILY HISTORY Family History  Problem Relation Age of Onset  . Heart attack Mother   . Hypertension Mother   . Congestive Heart Failure Father   . Pneumonia Father     SOCIAL HISTORY Social History   Tobacco Use  . Smoking status: Never Smoker  . Smokeless tobacco: Never Used  Substance Use Topics  . Alcohol use: No  . Drug use: No         OPHTHALMIC EXAM:  Base Eye Exam    Visual Acuity (ETDRS)      Right Left   Dist [place] 20/40 2 20/40 -1   Dist ph [place] NI NI       Tonometry (Tonopen, 9:59 AM)      Right Left   Pressure 14 13       Pupils      Pupils Dark Light Shape React APD   Right PERRL 3 2 Round Minimal None   Left PERRL 3 2 Round Slow None       Visual Fields (Counting fingers)      Left Right    Full Full       Extraocular Movement      Right Left    Full Full       Neuro/Psych    Oriented x3: Yes   Mood/Affect: Normal       Dilation    Left eye: 1.0% Mydriacyl, 2.5% Phenylephrine @ 9:59 AM  Slit Lamp and  Fundus Exam    External Exam      Right Left   External Normal Normal       Slit Lamp Exam      Right Left   Lids/Lashes Normal Normal   Conjunctiva/Sclera White and quiet White and quiet   Cornea Clear Clear   Anterior Chamber Deep and quiet Deep and quiet   Iris Round and reactive Round and reactive   Lens Posterior chamber intraocular lens Posterior chamber intraocular lens   Anterior Vitreous Normal Normal       Fundus Exam      Right Left   Posterior Vitreous  Normal   Disc  Normal   C/D Ratio  0.45   Macula  Hard drusen, no macular thickening, Retinal pigment epithelial mottling   Vessels  Normal   Periphery  Normal          IMAGING AND PROCEDURES  Imaging and Procedures for 09/26/19  OCT, Retina - OU - Both Eyes       Right Eye Quality was good. Scan locations included subfoveal. Central Foveal Thickness: 296. Findings include cystoid macular edema.   Left Eye Quality was good. Scan locations included subfoveal. Central Foveal Thickness: 251. Findings include cystoid macular edema.   Notes CNVM OD nearly involutional as compared to 3 months previous, with no antecedent therapy  OS, nearly involutional pericapillary CNVM as compared to 3 months previous post intravitreal Avastin                 ASSESSMENT/PLAN:  Exudative age-related macular degeneration of right eye with active choroidal neovascularization ([place]) Lesion perimacular last visit in April 2021, has nearly resolved, without treatment.  Exudative age-related macular degeneration of left eye with active choroidal neovascularization (HCC) OS, now 3 months status post injection intravitreal Avastin.  Lesion is is nearly quiesced sent and involutional.  Patient is recovering from ankle fracture and is in a nursing home and has sort of a weak disposition  today      ICD-10-CM   1. Exudative age-related macular degeneration of left eye with active choroidal neovascularization (HCC)   H35.3221 OCT, Retina - OU - Both Eyes  2. Exudative age-related macular degeneration of right eye with active choroidal neovascularization (HCC)  H35.3211 OCT, Retina - OU - Both Eyes    1.  We will like to monitor OU today and have patient follow-up after her rehab for her ankle injury improves  2.  3.  Ophthalmic Meds Ordered this visit:  No orders of the defined types were placed in this encounter.      Return in about 8 weeks (around 11/21/2019) for DILATE OU, OCT.  There are no Patient Instructions on file for this visit.   Explained the diagnoses, plan, and follow up with the patient and they expressed understanding.  Patient expressed understanding of the importance of proper follow up care.   Clent Demark Celine Dishman M.D. Diseases & Surgery of the Retina and Vitreous Retina & Diabetic [place] 09/26/19     Abbreviations: M myopia (nearsighted); A astigmatism; H hyperopia (farsighted); P presbyopia; Mrx spectacle prescription;  CTL contact lenses; OD right eye; OS left eye; OU both eyes  XT exotropia; ET esotropia; PEK punctate epithelial keratitis; PEE punctate epithelial erosions; DES dry eye syndrome; MGD meibomian gland dysfunction; ATs artificial tears; PFAT's preservative free artificial tears; [place] nuclear sclerotic cataract; PSC posterior subcapsular cataract; ERM epi-retinal membrane; PVD posterior vitreous detachment; RD retinal detachment; DM  diabetes mellitus; DR diabetic retinopathy; NPDR non-proliferative diabetic retinopathy; PDR proliferative diabetic retinopathy; CSME clinically significant macular edema; DME diabetic macular edema; dbh dot blot hemorrhages; CWS cotton wool spot; POAG primary open angle glaucoma; C/D cup-to-disc ratio; HVF humphrey visual field; GVF goldmann visual field; OCT optical coherence tomography; IOP intraocular pressure; BRVO Branch retinal vein occlusion; CRVO central retinal vein occlusion; CRAO central retinal artery occlusion; BRAO branch  retinal artery occlusion; RT retinal tear; SB scleral buckle; PPV pars plana vitrectomy; VH Vitreous hemorrhage; PRP panretinal laser photocoagulation; IVK intravitreal kenalog; VMT vitreomacular traction; MH Macular hole;  NVD neovascularization of the disc; NVE neovascularization elsewhere; AREDS age related eye disease study; ARMD age related macular degeneration; POAG primary open angle glaucoma; EBMD epithelial/anterior basement membrane dystrophy; ACIOL anterior chamber intraocular lens; IOL intraocular lens; PCIOL posterior chamber intraocular lens; Phaco/IOL phacoemulsification with intraocular lens placement; [place] photorefractive keratectomy; LASIK laser assisted in situ keratomileusis; HTN hypertension; DM diabetes mellitus; COPD chronic obstructive pulmonary disease

## 2019-09-26 NOTE — Assessment & Plan Note (Signed)
OS, now 3 months status post injection intravitreal Avastin.  Lesion is is nearly quiesced sent and involutional.  Patient is recovering from ankle fracture and is in a nursing home and has sort of a weak disposition  today

## 2019-09-26 NOTE — Assessment & Plan Note (Signed)
Lesion perimacular last visit in April 2021, has nearly resolved, without treatment.

## 2019-09-28 DIAGNOSIS — R2681 Unsteadiness on feet: Secondary | ICD-10-CM | POA: Diagnosis not present

## 2019-09-28 DIAGNOSIS — M6281 Muscle weakness (generalized): Secondary | ICD-10-CM | POA: Diagnosis not present

## 2019-09-28 DIAGNOSIS — R531 Weakness: Secondary | ICD-10-CM | POA: Diagnosis not present

## 2019-09-28 DIAGNOSIS — S8261XD Displaced fracture of lateral malleolus of right fibula, subsequent encounter for closed fracture with routine healing: Secondary | ICD-10-CM | POA: Diagnosis not present

## 2019-09-28 DIAGNOSIS — R2689 Other abnormalities of gait and mobility: Secondary | ICD-10-CM | POA: Diagnosis not present

## 2019-10-14 DIAGNOSIS — S8261XD Displaced fracture of lateral malleolus of right fibula, subsequent encounter for closed fracture with routine healing: Secondary | ICD-10-CM | POA: Diagnosis not present

## 2019-10-14 DIAGNOSIS — R2689 Other abnormalities of gait and mobility: Secondary | ICD-10-CM | POA: Diagnosis not present

## 2019-10-14 DIAGNOSIS — M6281 Muscle weakness (generalized): Secondary | ICD-10-CM | POA: Diagnosis not present

## 2019-10-14 DIAGNOSIS — R2681 Unsteadiness on feet: Secondary | ICD-10-CM | POA: Diagnosis not present

## 2019-10-14 DIAGNOSIS — R531 Weakness: Secondary | ICD-10-CM | POA: Diagnosis not present

## 2019-11-04 DIAGNOSIS — N390 Urinary tract infection, site not specified: Secondary | ICD-10-CM | POA: Diagnosis not present

## 2019-11-05 DIAGNOSIS — N1831 Chronic kidney disease, stage 3a: Secondary | ICD-10-CM | POA: Diagnosis not present

## 2019-11-05 DIAGNOSIS — R5381 Other malaise: Secondary | ICD-10-CM | POA: Diagnosis not present

## 2019-11-05 DIAGNOSIS — I119 Hypertensive heart disease without heart failure: Secondary | ICD-10-CM | POA: Diagnosis not present

## 2019-11-05 DIAGNOSIS — I5032 Chronic diastolic (congestive) heart failure: Secondary | ICD-10-CM | POA: Diagnosis not present

## 2019-11-12 DIAGNOSIS — S8261XD Displaced fracture of lateral malleolus of right fibula, subsequent encounter for closed fracture with routine healing: Secondary | ICD-10-CM | POA: Diagnosis not present

## 2019-11-12 DIAGNOSIS — S8263XA Displaced fracture of lateral malleolus of unspecified fibula, initial encounter for closed fracture: Secondary | ICD-10-CM | POA: Diagnosis not present

## 2019-11-21 ENCOUNTER — Other Ambulatory Visit: Payer: Self-pay

## 2019-11-21 ENCOUNTER — Encounter (INDEPENDENT_AMBULATORY_CARE_PROVIDER_SITE_OTHER): Payer: Self-pay | Admitting: Ophthalmology

## 2019-11-21 ENCOUNTER — Ambulatory Visit (INDEPENDENT_AMBULATORY_CARE_PROVIDER_SITE_OTHER): Payer: PPO | Admitting: Ophthalmology

## 2019-11-21 DIAGNOSIS — H353221 Exudative age-related macular degeneration, left eye, with active choroidal neovascularization: Secondary | ICD-10-CM

## 2019-11-21 DIAGNOSIS — H353211 Exudative age-related macular degeneration, right eye, with active choroidal neovascularization: Secondary | ICD-10-CM

## 2019-11-21 MED ORDER — BEVACIZUMAB CHEMO INJECTION 1.25MG/0.05ML SYRINGE FOR KALEIDOSCOPE
1.2500 mg | INTRAVITREAL | Status: AC | PRN
  Administered 2019-11-21: 1.25 mg via INTRAVITREAL

## 2019-11-21 NOTE — Assessment & Plan Note (Signed)
Active disease nasal to the fovea threatening the fovea worse since last visit 7 weeks previous, will need to reinstitute therapy with intravitreal add Avastin today

## 2019-11-21 NOTE — Progress Notes (Signed)
11/21/2019     CHIEF COMPLAINT Patient presents for Retina Follow Up   HISTORY OF PRESENT ILLNESS: Victoria Moran is a 84 y.o. female who presents to the clinic today for:   HPI    Retina Follow Up    Patient presents with  Wet AMD.  In both eyes.  Severity is moderate.  Duration of 8 weeks.  Since onset it is stable.  I, the attending physician,  performed the HPI with the patient and updated documentation appropriately.          Comments    8 Week  AMD f\u OU. OCT  Pt states vision is stable. Pt states she had a stye on OS and it feels like something is in it.       Last edited by Hurman Horn, MD on 11/21/2019 11:43 AM. (History)      Referring physician: [name], MD [place],  [place] [id number]  HISTORICAL INFORMATION:   Selected notes from the MEDICAL RECORD NUMBER       CURRENT MEDICATIONS: No current outpatient medications on file. (Ophthalmic Drugs)   No current facility-administered medications for this visit. (Ophthalmic Drugs)   Current Outpatient Medications (Other)  Medication Sig  . amLODipine (NORVASC) 10 MG tablet Take 10 mg by mouth daily as needed.  . calcitRIOL (ROCALTROL) 0.25 MCG capsule Take 0.25 mcg by mouth daily.  . Calcium Carbonate-Vit D-Min (CALCIUM 600+D PLUS MINERALS PO) Take 1 tablet by mouth daily.  . clonazePAM (KLONOPIN) 1 MG tablet Take 1 mg by mouth every 8 (eight) hours as needed.  . clorazepate (TRANXENE) 7.5 MG tablet Take 7.5 mg by mouth at bedtime as needed for sleep.   . ferrous sulfate 325 (65 FE) MG EC tablet Take 325 mg by mouth daily.  . fluticasone (FLONASE) 50 MCG/ACT nasal spray Place 1 spray into both nostrils daily.  . furosemide (LASIX) 20 MG tablet Take 20 mg by mouth.  [name] lisinopril (ZESTRIL) 40 MG tablet Take 20 mg by mouth daily.  . LUTEIN PO Take 40 mg by mouth daily.  . metoprolol tartrate (LOPRESSOR) 100 MG tablet Take 100 mg by mouth 2 (two) times daily.  . Multiple  Vitamins-Minerals (MACULAR VITAMIN BENEFIT) TABS Take 4 tablets by mouth daily.   . nabumetone (RELAFEN) 750 MG tablet Take 750 mg by mouth 2 (two) times daily as needed.   . predniSONE (DELTASONE) 5 MG tablet Take 5 mg by mouth every morning.   . promethazine (PHENERGAN) 25 MG tablet Take 25 mg by mouth every 6 (six) hours as needed for nausea or vomiting.  . Red Yeast Rice 600 MG TABS Take 1 tablet by mouth daily.  . traMADol (ULTRAM) 50 MG tablet Take 50 mg by mouth every 8 (eight) hours as needed.  . vitamin B-12 (CYANOCOBALAMIN) 1000 MCG tablet Take 1,000 mcg by mouth daily.  . vitamin E 1000 UNIT capsule Take 1,000 Units by mouth daily.   No current facility-administered medications for this visit. (Other)      REVIEW OF SYSTEMS:    ALLERGIES Allergies  Allergen Reactions  . Cephalosporins Diarrhea  . Codeine Nausea Only    PAST MEDICAL HISTORY Past Medical History:  Diagnosis Date  . Anemia   . Anxiety   . Arthritis   . Cancer ([place])    hx bilateral breast cancer  . Constipation   . Cystic mastopathy   . Edema of extremities   . Esophageal  reflux   . History of breast cancer oncologist--  [place] cancer caner--  no recurrence   dx 2009--  DCIS (cT1 N0 M0) s/p  right total mastectomy//  2014--  low grade DCIS  ---s/p left mastectomy//    no chemo or radiation  . Hypercholesteremia   . Hypertension   . Hypertension   . Hyponatremia   . IBS (irritable bowel syndrome)   . Insomnia   . Insomnia   . Lumbago   . Macular degeneration of both eyes   . Muscle cramping   . Osteoarthritis   . Osteopenia   . Postherpetic neuralgia   . Pruritic disorder   . Stage 4 chronic kidney disease ([place])   . Swelling of both ankles   . Tendinopathy of left gluteus medius    hip  . Weakness    Past Surgical History:  Procedure Laterality Date  . CATARACT EXTRACTION W/ INTRAOCULAR LENS  IMPLANT, BILATERAL    . DECOMPRESSIVE LUMBAR LAMINECTOMY LEVEL 2  03/08/2012    Procedure: DECOMPRESSIVE LUMBAR LAMINECTOMY LEVEL 2;  Surgeon: Melina Schools, MD;  Location: [facility];  Service: Orthopedics;  Laterality: N/A;  Decompression L3-L5   . ELBOW SURGERY Left 2003  . EXCISION/RELEASE BURSA HIP Left 03/18/2015   Procedure: LEFT HIP BURSECTOMY WITH GLUTEAL TENDON REPAIR;  Surgeon: Gaynelle Arabian, MD;  Location: [facility];  Service: Orthopedics;  Laterality: Left;  . KNEE ARTHROSCOPY Left   . LEFT FOOT SURGERY  01-09-2008   left great toe and left second toe  . MASTECTOMY W/ SENTINEL NODE BIOPSY Left 07/04/2012   Procedure: MASTECTOMY WITH SENTINEL LYMPH NODE BIOPSY;  Surgeon: Stark Klein, MD;  Location: [facility];  Service: General;  Laterality: Left;  . OPEN SURGICAL REPAIR OF GLUTEAL TENDON Right 09/27/2013   Procedure: RIGHT HIP BURSECTOMY/GLUTEAL TENDON REPAIR;  Surgeon: Gearlean Alf, MD;  Location: [facility];  Service: Orthopedics;  Laterality: Right;  . ROTATOR CUFF REPAIR Right ?2005  . TOTAL MASTECTOMY Right 08-08-2007   w/  SLN bx    FAMILY HISTORY Family History  Problem Relation Age of Onset  . Heart attack Mother   . Hypertension Mother   . Congestive Heart Failure Father   . Pneumonia Father     SOCIAL HISTORY Social History   Tobacco Use  . Smoking status: Never Smoker  . Smokeless tobacco: Never Used  Substance Use Topics  . Alcohol use: No  . Drug use: No         OPHTHALMIC EXAM:  Base Eye Exam    Visual Acuity (Snellen - Linear)      Right Left   Dist [place] 20/50 -1 20/40 -2   Dist ph [place] NI NI       Tonometry (Tonopen, 10:29 AM)      Right Left   Pressure 11 10       Pupils      Pupils Dark Light Shape React APD   Right PERRL 3 3 Round Minimal None   Left PERRL 3 3 Round Minimal None       Visual Fields (Counting fingers)      Left Right    Full Full       Neuro/Psych    Oriented x3: Yes   Mood/Affect: Normal       Dilation    Both eyes: 1.0% Mydriacyl, 2.5% Phenylephrine @ 10:29 AM        Slit  Lamp and Fundus Exam    External  Exam      Right Left   External Normal Normal       Slit Lamp Exam      Right Left   Lids/Lashes Normal Normal   Conjunctiva/Sclera White and quiet White and quiet   Cornea Clear Clear   Anterior Chamber Deep and quiet Deep and quiet   Iris Round and reactive Round and reactive   Lens Posterior chamber intraocular lens Posterior chamber intraocular lens   Anterior Vitreous Normal Normal       Fundus Exam      Right Left   Posterior Vitreous Posterior vitreous detachment Normal   Disc Normal Normal   C/D Ratio 0.35 0.3   Macula Drusen, exam from the wheelchair setting with a 20 diopter lens Hard drusen,  macular thickening, Retinal pigment epithelial mottling   Vessels Normal Normal   Periphery Normal Normal          IMAGING AND PROCEDURES  Imaging and Procedures for 11/21/19  OCT, Retina - OU - Both Eyes       Right Eye Quality was good. Scan locations included subfoveal. Central Foveal Thickness: 338. Findings include cystoid macular edema, abnormal foveal contour.   Left Eye Quality was good. Scan locations included subfoveal. Central Foveal Thickness: 303. Findings include cystoid macular edema, abnormal foveal contour.   Notes   OD with active CNVM nasal to the fovea, doing worse will need to restart Avastin,  OS with active lesion of CNVM nasal and inferior to the fovea will need to restart intravitreal Avastin       Intravitreal Injection, Pharmacologic Agent - OD - Right Eye       Time Out 11/21/2019. 11:36 AM. Confirmed correct patient, procedure, site, and patient consented.   Anesthesia Topical anesthesia was used. Anesthetic medications included Akten 3.5%.   Procedure Preparation included Ofloxacin , 5% betadine to ocular surface, 10% betadine to eyelids. A supplied needle was used.   Injection:  1.25 mg Bevacizumab (AVASTIN) SOLN   NDC: [address]-5, Lot: [id number]   Route: Intravitreal, Site: Right Eye,  Waste: 0 mg  Post-op Post injection exam found visual acuity of at least counting fingers. The patient tolerated the procedure well. There were no complications. The patient received written and verbal post procedure care education. Post injection medications were not given.        Intravitreal Injection, Pharmacologic Agent - OS - Left Eye       Time Out 11/21/2019. 11:41 AM. Confirmed correct patient, procedure, site, and patient consented.   Anesthesia Topical anesthesia was used. Anesthetic medications included Akten 3.5%.   Procedure Preparation included Ofloxacin . A supplied needle was used.   Injection:  1.25 mg Bevacizumab (AVASTIN) SOLN   NDC: [address]-9, Lot: [id number]   Route: Intravitreal, Site: Left Eye, Waste: 0 mg  Post-op Post injection exam found visual acuity of at least counting fingers. The patient tolerated the procedure well. There were no complications. The patient received written and verbal post procedure care education. Post injection medications were not given.                 ASSESSMENT/PLAN:  Exudative age-related macular degeneration of left eye with active choroidal neovascularization (HCC) Active disease nasal to the fovea threatening the fovea worse since last visit 7 weeks previous, will need to reinstitute therapy with intravitreal add Avastin today  Exudative age-related macular degeneration of right eye with active choroidal neovascularization ([place]) Preserve visual acuity but much worse worse with wet ARMD,  active CNVM also temporal to the [place] in this case.  Will need to restart intravitreal Avastin OD today      ICD-10-CM   1. Exudative age-related macular degeneration of right eye with active choroidal neovascularization (HCC)  H35.3211 OCT, Retina - OU - Both Eyes    Intravitreal Injection, Pharmacologic Agent - OD - Right Eye    Bevacizumab (AVASTIN) SOLN 1.25 mg  2. Exudative age-related macular degeneration of left eye with  active choroidal neovascularization (HCC)  H35.3221 OCT, Retina - OU - Both Eyes    Intravitreal Injection, Pharmacologic Agent - OS - Left Eye    Bevacizumab (AVASTIN) SOLN 1.25 mg    1.  2.  3.  Ophthalmic Meds Ordered this visit:  Meds ordered this encounter  Medications  . Bevacizumab (AVASTIN) SOLN 1.25 mg  . Bevacizumab (AVASTIN) SOLN 1.25 mg       Return in about 6 weeks (around 01/02/2020) for DILATE OU, AVASTIN OCT OU.  There are no Patient Instructions on file for this visit.   Explained the diagnoses, plan, and follow up with the patient and they expressed understanding.  Patient expressed understanding of the importance of proper follow up care.   Clent Demark Aizlyn Schifano M.D. Diseases & Surgery of the Retina and Vitreous [facility] [place] 11/21/19     Abbreviations: M myopia (nearsighted); A astigmatism; H hyperopia (farsighted); P presbyopia; Mrx spectacle prescription;  CTL contact lenses; OD right eye; OS left eye; OU both eyes  XT exotropia; ET esotropia; PEK punctate epithelial keratitis; PEE punctate epithelial erosions; DES dry eye syndrome; MGD meibomian gland dysfunction; ATs artificial tears; PFAT's preservative free artificial tears; [place] nuclear sclerotic cataract; PSC posterior subcapsular cataract; ERM epi-retinal membrane; PVD posterior vitreous detachment; RD retinal detachment; DM diabetes mellitus; DR diabetic retinopathy; NPDR non-proliferative diabetic retinopathy; PDR proliferative diabetic retinopathy; CSME clinically significant macular edema; DME diabetic macular edema; dbh dot blot hemorrhages; CWS cotton wool spot; POAG primary open angle glaucoma; C/D cup-to-disc ratio; HVF humphrey visual field; GVF goldmann visual field; OCT optical coherence tomography; IOP intraocular pressure; BRVO Branch retinal vein occlusion; CRVO central retinal vein occlusion; CRAO central retinal artery occlusion; BRAO branch retinal artery occlusion; RT retinal  tear; SB scleral buckle; PPV pars plana vitrectomy; VH Vitreous hemorrhage; PRP panretinal laser photocoagulation; IVK intravitreal kenalog; VMT vitreomacular traction; MH Macular hole;  NVD neovascularization of the disc; NVE neovascularization elsewhere; AREDS age related eye disease study; ARMD age related macular degeneration; POAG primary open angle glaucoma; EBMD epithelial/anterior basement membrane dystrophy; ACIOL anterior chamber intraocular lens; IOL intraocular lens; PCIOL posterior chamber intraocular lens; Phaco/IOL phacoemulsification with intraocular lens placement; [place] photorefractive keratectomy; LASIK laser assisted in situ keratomileusis; HTN hypertension; DM diabetes mellitus; COPD chronic obstructive pulmonary disease

## 2019-11-21 NOTE — Assessment & Plan Note (Signed)
Preserve visual acuity but much worse worse with wet ARMD, active CNVM also temporal to the [place] in this case.  Will need to restart intravitreal Avastin OD today

## 2019-12-03 DIAGNOSIS — N184 Chronic kidney disease, stage 4 (severe): Secondary | ICD-10-CM | POA: Diagnosis not present

## 2019-12-03 DIAGNOSIS — N2581 Secondary hyperparathyroidism of renal origin: Secondary | ICD-10-CM | POA: Diagnosis not present

## 2019-12-03 DIAGNOSIS — R600 Localized edema: Secondary | ICD-10-CM | POA: Diagnosis not present

## 2019-12-03 DIAGNOSIS — N189 Chronic kidney disease, unspecified: Secondary | ICD-10-CM | POA: Diagnosis not present

## 2019-12-03 DIAGNOSIS — I129 Hypertensive chronic kidney disease with stage 1 through stage 4 chronic kidney disease, or unspecified chronic kidney disease: Secondary | ICD-10-CM | POA: Diagnosis not present

## 2019-12-03 DIAGNOSIS — D631 Anemia in chronic kidney disease: Secondary | ICD-10-CM | POA: Diagnosis not present

## 2019-12-05 DIAGNOSIS — F4323 Adjustment disorder with mixed anxiety and depressed mood: Secondary | ICD-10-CM | POA: Diagnosis not present

## 2019-12-11 DIAGNOSIS — I5032 Chronic diastolic (congestive) heart failure: Secondary | ICD-10-CM | POA: Diagnosis not present

## 2019-12-11 DIAGNOSIS — R5381 Other malaise: Secondary | ICD-10-CM | POA: Diagnosis not present

## 2019-12-11 DIAGNOSIS — M810 Age-related osteoporosis without current pathological fracture: Secondary | ICD-10-CM | POA: Diagnosis not present

## 2019-12-11 DIAGNOSIS — D649 Anemia, unspecified: Secondary | ICD-10-CM | POA: Diagnosis not present

## 2020-01-06 ENCOUNTER — Other Ambulatory Visit: Payer: Self-pay

## 2020-01-06 ENCOUNTER — Ambulatory Visit (INDEPENDENT_AMBULATORY_CARE_PROVIDER_SITE_OTHER): Payer: PPO | Admitting: Ophthalmology

## 2020-01-06 ENCOUNTER — Encounter (INDEPENDENT_AMBULATORY_CARE_PROVIDER_SITE_OTHER): Payer: Self-pay | Admitting: Ophthalmology

## 2020-01-06 DIAGNOSIS — H353211 Exudative age-related macular degeneration, right eye, with active choroidal neovascularization: Secondary | ICD-10-CM | POA: Diagnosis not present

## 2020-01-06 DIAGNOSIS — H353221 Exudative age-related macular degeneration, left eye, with active choroidal neovascularization: Secondary | ICD-10-CM | POA: Diagnosis not present

## 2020-01-06 MED ORDER — BEVACIZUMAB CHEMO INJECTION 1.25MG/0.05ML SYRINGE FOR KALEIDOSCOPE
1.2500 mg | INTRAVITREAL | Status: AC | PRN
  Administered 2020-01-06: 1.25 mg via INTRAVITREAL

## 2020-01-06 NOTE — Assessment & Plan Note (Signed)
OU status post intravitreal Avastin at 7-week interval, each vastly improved anatomy with less subretinal fluid and intraretinal fluid.  We will repeat injection today and monitor with return visit in 9 weeks to minimize his treatment burden on this patient in a wheelchair

## 2020-01-06 NOTE — Progress Notes (Signed)
01/06/2020     CHIEF COMPLAINT Patient presents for Retina Follow Up   HISTORY OF PRESENT ILLNESS: Victoria Moran is a 84 y.o. female who presents to the clinic today for:   HPI    Retina Follow Up    Patient presents with  Wet AMD.  In both eyes.  This started 7 weeks ago.  Severity is mild.  Duration of 7 weeks.  Since onset it is stable.          Comments    7 Week AMD F/U OU, poss Avastin OU  Pt denies noticeable changes to [place] OU since last visit. Pt denies ocular pain, flashes of light, or floaters OU.         Last edited by Rockie Neighbours, [place] on 01/06/2020 10:33 AM. (History)      Referring physician: [name], MD [place],  [place] [id number]  HISTORICAL INFORMATION:   Selected notes from the [facility]: No current outpatient medications on file. (Ophthalmic Drugs)   No current facility-administered medications for this visit. (Ophthalmic Drugs)   Current Outpatient Medications (Other)  Medication Sig  . amLODipine (NORVASC) 10 MG tablet Take 10 mg by mouth daily as needed.  . calcitRIOL (ROCALTROL) 0.25 MCG capsule Take 0.25 mcg by mouth daily.  . Calcium Carbonate-Vit D-Min (CALCIUM 600+D PLUS MINERALS PO) Take 1 tablet by mouth daily.  . clonazePAM (KLONOPIN) 1 MG tablet Take 1 mg by mouth every 8 (eight) hours as needed.  . clorazepate (TRANXENE) 7.5 MG tablet Take 7.5 mg by mouth at bedtime as needed for sleep.   . ferrous sulfate 325 (65 FE) MG EC tablet Take 325 mg by mouth daily.  . fluticasone (FLONASE) 50 MCG/ACT nasal spray Place 1 spray into both nostrils daily.  . furosemide (LASIX) 20 MG tablet Take 20 mg by mouth.  [name] lisinopril (ZESTRIL) 40 MG tablet Take 20 mg by mouth daily.  . LUTEIN PO Take 40 mg by mouth daily.  . metoprolol tartrate (LOPRESSOR) 100 MG tablet Take 100 mg by mouth 2 (two) times daily.  . Multiple Vitamins-Minerals (MACULAR VITAMIN BENEFIT) TABS Take 4  tablets by mouth daily.   . nabumetone (RELAFEN) 750 MG tablet Take 750 mg by mouth 2 (two) times daily as needed.   . predniSONE (DELTASONE) 5 MG tablet Take 5 mg by mouth every morning.   . promethazine (PHENERGAN) 25 MG tablet Take 25 mg by mouth every 6 (six) hours as needed for nausea or vomiting.  . Red Yeast Rice 600 MG TABS Take 1 tablet by mouth daily.  . traMADol (ULTRAM) 50 MG tablet Take 50 mg by mouth every 8 (eight) hours as needed.  . vitamin B-12 (CYANOCOBALAMIN) 1000 MCG tablet Take 1,000 mcg by mouth daily.  . vitamin E 1000 UNIT capsule Take 1,000 Units by mouth daily.   No current facility-administered medications for this visit. (Other)      REVIEW OF SYSTEMS:    ALLERGIES Allergies  Allergen Reactions  . Cephalosporins Diarrhea  . Codeine Nausea Only    PAST MEDICAL HISTORY Past Medical History:  Diagnosis Date  . Anemia   . Anxiety   . Arthritis   . Cancer ([place])    hx bilateral breast cancer  . Constipation   . Cystic mastopathy   . Edema of extremities   . Esophageal reflux   . History of breast cancer oncologist--  [facility] cancer caner--  no recurrence   dx 2009--  DCIS (cT1 N0 M0) s/p  right total mastectomy//  2014--  low grade DCIS  ---s/p left mastectomy//    no chemo or radiation  . Hypercholesteremia   . Hypertension   . Hypertension   . Hyponatremia   . IBS (irritable bowel syndrome)   . Insomnia   . Insomnia   . Lumbago   . Macular degeneration of both eyes   . Muscle cramping   . Osteoarthritis   . Osteopenia   . Postherpetic neuralgia   . Pruritic disorder   . Stage 4 chronic kidney disease ([place])   . Swelling of both ankles   . Tendinopathy of left gluteus medius    hip  . Weakness    Past Surgical History:  Procedure Laterality Date  . CATARACT EXTRACTION W/ INTRAOCULAR LENS  IMPLANT, BILATERAL    . DECOMPRESSIVE LUMBAR LAMINECTOMY LEVEL 2  03/08/2012   Procedure: DECOMPRESSIVE LUMBAR LAMINECTOMY LEVEL 2;  Surgeon:  Melina Schools, MD;  Location: [facility];  Service: Orthopedics;  Laterality: N/A;  Decompression L3-L5   . ELBOW SURGERY Left 2003  . EXCISION/RELEASE BURSA HIP Left 03/18/2015   Procedure: LEFT HIP BURSECTOMY WITH GLUTEAL TENDON REPAIR;  Surgeon: Gaynelle Arabian, MD;  Location: [facility];  Service: Orthopedics;  Laterality: Left;  . KNEE ARTHROSCOPY Left   . LEFT FOOT SURGERY  01-09-2008   left great toe and left second toe  . MASTECTOMY W/ SENTINEL NODE BIOPSY Left 07/04/2012   Procedure: MASTECTOMY WITH SENTINEL LYMPH NODE BIOPSY;  Surgeon: Stark Klein, MD;  Location: [facility];  Service: General;  Laterality: Left;  . OPEN SURGICAL REPAIR OF GLUTEAL TENDON Right 09/27/2013   Procedure: RIGHT HIP BURSECTOMY/GLUTEAL TENDON REPAIR;  Surgeon: Gearlean Alf, MD;  Location: [facility];  Service: Orthopedics;  Laterality: Right;  . ROTATOR CUFF REPAIR Right ?2005  . TOTAL MASTECTOMY Right 08-08-2007   w/  SLN bx    FAMILY HISTORY Family History  Problem Relation Age of Onset  . Heart attack Mother   . Hypertension Mother   . Congestive Heart Failure Father   . Pneumonia Father     SOCIAL HISTORY Social History   Tobacco Use  . Smoking status: Never Smoker  . Smokeless tobacco: Never Used  Substance Use Topics  . Alcohol use: No  . Drug use: No         OPHTHALMIC EXAM:  Base Eye Exam    Visual Acuity (ETDRS)      Right Left   Dist [place] 20/60 -2 20/30 +1   Dist ph [place] NI 20/30 +2       Tonometry (Tonopen, 10:33 AM)      Right Left   Pressure 07 08       Pupils      Pupils Dark Light Shape React APD   Right PERRL 3 3 Round Minimal None   Left PERRL 3 3 Round Minimal None       Visual Fields (Counting fingers)      Left Right    Full Full       Extraocular Movement      Right Left    Full Full       Neuro/Psych    Oriented x3: Yes   Mood/Affect: Normal       Dilation    Both eyes: 1.0% Mydriacyl, 2.5% Phenylephrine @ 10:40 AM        Slit  Lamp and  Fundus Exam    External Exam      Right Left   External Normal Normal       Slit Lamp Exam      Right Left   Lids/Lashes Normal Normal   Conjunctiva/Sclera White and quiet White and quiet   Cornea Clear Clear   Anterior Chamber Deep and quiet Deep and quiet   Iris Round and reactive Round and reactive   Lens Posterior chamber intraocular lens Posterior chamber intraocular lens   Anterior Vitreous Normal Normal       Fundus Exam      Right Left   Posterior Vitreous Posterior vitreous detachment Normal   Disc Normal Normal   C/D Ratio 0.35 0.3   Macula Drusen, exam from the wheelchair setting with a 20 diopter lens Hard drusen,  macular thickening, Retinal pigment epithelial mottling   Vessels Normal Normal   Periphery Normal Normal          IMAGING AND PROCEDURES  Imaging and Procedures for 01/06/20  OCT, Retina - OU - Both Eyes       Right Eye Quality was borderline. Progression has improved. Findings include abnormal foveal contour.   Left Eye Quality was borderline. Scan locations included subfoveal. Progression has improved. Findings include abnormal foveal contour.   Notes OU status post intravitreal Avastin at 7-week interval, each vastly improved anatomy with less subretinal fluid and intraretinal fluid.  We will repeat injection today and monitor with return visit in 9 weeks to minimize his treatment burden on this patient in a wheelchair       Intravitreal Injection, Pharmacologic Agent - OD - Right Eye       Time Out 01/06/2020. 11:44 AM. Confirmed correct patient, procedure, site, and patient consented.   Anesthesia Topical anesthesia was used. Anesthetic medications included [place]ten 3.5%.   Procedure Preparation included Ofloxacin , 5% betadine to ocular surface, 10% betadine to eyelids. A supplied needle was used.   Injection:  1.25 mg Bevacizumab (AVASTIN) SOLN   NDC: 70360-001-02, Lot: [id number]   Route: Intravitreal, Site: Right Eye, Waste: 0  mg  Post-op Post injection exam found visual acuity of at least counting fingers. The patient tolerated the procedure well. There were no complications. The patient received written and verbal post procedure care education. Post injection medications were not given.        Intravitreal Injection, Pharmacologic Agent - OS - Left Eye       Time Out 01/06/2020. 11:44 AM. Confirmed correct patient, procedure, site, and patient consented.   Anesthesia Topical anesthesia was used. Anesthetic medications included Akten 3.5%.   Procedure Preparation included Ofloxacin , 10% betadine to eyelids, 5% betadine to ocular surface. A supplied needle was used.   Injection:  1.25 mg Bevacizumab (AVASTIN) SOLN   NDC: 70360-001-02, Lot: [id number]   Route: Intravitreal, Site: Left Eye, Waste: 0 mg  Post-op Post injection exam found visual acuity of at least counting fingers. The patient tolerated the procedure well. There were no complications. The patient received written and verbal post procedure care education. Post injection medications were not given.                 ASSESSMENT/PLAN:  Exudative age-related macular degeneration of left eye with active choroidal neovascularization (HCC) OU status post intravitreal Avastin at 7-week interval, each vastly improved anatomy with less subretinal fluid and intraretinal fluid.  We will repeat injection today and monitor with return visit in 9 weeks to minimize  his treatment burden on this patient in a wheelchair      ICD-10-CM   1. Exudative age-related macular degeneration of right eye with active choroidal neovascularization (HCC)  H35.3211 OCT, Retina - OU - Both Eyes    Intravitreal Injection, Pharmacologic Agent - OD - Right Eye    Bevacizumab (AVASTIN) SOLN 1.25 mg  2. Exudative age-related macular degeneration of left eye with active choroidal neovascularization (HCC)  H35.3221 OCT, Retina - OU - Both Eyes    Intravitreal Injection,  Pharmacologic Agent - OS - Left Eye    Bevacizumab (AVASTIN) SOLN 1.25 mg    1.OU status post intravitreal Avastin at 7-week interval, each vastly improved anatomy with less subretinal fluid and intraretinal fluid.  We will repeat injection today and monitor with return visit in 9 weeks to minimize his treatment burden on this patient in a wheelchair  2.  3.  Ophthalmic Meds Ordered this visit:  Meds ordered this encounter  Medications  . Bevacizumab (AVASTIN) SOLN 1.25 mg  . Bevacizumab (AVASTIN) SOLN 1.25 mg       Return in about 9 weeks (around 03/09/2020) for DILATE OU, AVASTIN OCT.  There are no Patient Instructions on file for this visit.   Explained the diagnoses, plan, and follow up with the patient and they expressed understanding.  Patient expressed understanding of the importance of proper follow up care.   Clent Demark Lanson Randle M.D. Diseases & Surgery of the Retina and Vitreous [facility] 01/06/20     Abbreviations: M myopia (nearsighted); A astigmatism; H hyperopia (farsighted); P presbyopia; Mrx spectacle prescription;  CTL contact lenses; OD right eye; OS left eye; OU both eyes  XT exotropia; ET esotropia; PEK punctate epithelial keratitis; PEE punctate epithelial erosions; DES dry eye syndrome; MGD meibomian gland dysfunction; ATs artificial tears; PFAT's preservative free artificial tears; [place] nuclear sclerotic cataract; PSC posterior subcapsular cataract; ERM epi-retinal membrane; PVD posterior vitreous detachment; RD retinal detachment; DM diabetes mellitus; DR diabetic retinopathy; NPDR non-proliferative diabetic retinopathy; PDR proliferative diabetic retinopathy; CSME clinically significant macular edema; DME diabetic macular edema; dbh dot blot hemorrhages; CWS cotton wool spot; POAG primary open angle glaucoma; C/D cup-to-disc ratio; HVF humphrey visual field; GVF goldmann visual field; OCT optical coherence tomography; IOP intraocular pressure;  BRVO Branch retinal vein occlusion; CRVO central retinal vein occlusion; CRAO central retinal artery occlusion; BRAO branch retinal artery occlusion; RT retinal tear; SB scleral buckle; PPV pars plana vitrectomy; VH Vitreous hemorrhage; PRP panretinal laser photocoagulation; IVK intravitreal kenalog; VMT vitreomacular traction; MH Macular hole;  NVD neovascularization of the disc; NVE neovascularization elsewhere; AREDS age related eye disease study; ARMD age related macular degeneration; POAG primary open angle glaucoma; EBMD epithelial/anterior basement membrane dystrophy; ACIOL anterior chamber intraocular lens; IOL intraocular lens; PCIOL posterior chamber intraocular lens; Phaco/IOL phacoemulsification with intraocular lens placement; [place] photorefractive keratectomy; LASIK laser assisted in situ keratomileusis; HTN hypertension; DM diabetes mellitus; COPD chronic obstructive pulmonary disease

## 2020-01-10 DIAGNOSIS — I119 Hypertensive heart disease without heart failure: Secondary | ICD-10-CM | POA: Diagnosis not present

## 2020-01-10 DIAGNOSIS — K219 Gastro-esophageal reflux disease without esophagitis: Secondary | ICD-10-CM | POA: Diagnosis not present

## 2020-01-10 DIAGNOSIS — N1831 Chronic kidney disease, stage 3a: Secondary | ICD-10-CM | POA: Diagnosis not present

## 2020-01-10 DIAGNOSIS — D649 Anemia, unspecified: Secondary | ICD-10-CM | POA: Diagnosis not present

## 2020-01-16 DIAGNOSIS — F4323 Adjustment disorder with mixed anxiety and depressed mood: Secondary | ICD-10-CM | POA: Diagnosis not present

## 2020-01-24 DIAGNOSIS — R0902 Hypoxemia: Secondary | ICD-10-CM | POA: Diagnosis not present

## 2020-01-24 DIAGNOSIS — D72829 Elevated white blood cell count, unspecified: Secondary | ICD-10-CM | POA: Diagnosis not present

## 2020-02-12 DEATH — deceased

## 2020-03-09 ENCOUNTER — Encounter (INDEPENDENT_AMBULATORY_CARE_PROVIDER_SITE_OTHER): Payer: PPO | Admitting: Ophthalmology

## 2020-03-28 IMAGING — MR MR LUMBAR SPINE W/O CM
5 series · 39 of 48 positions shown · non-contrast
Comparison: Radiographs of the lumbar spine 03/08/2012

CLINICAL DATA: Cerebellar ataxia and disease classified elsewhere.
Weakness, ataxia, slowly progressive or long duration.

EXAM:
MRI LUMBAR SPINE WITHOUT CONTRAST
TECHNIQUE: Multiplanar, multisequence MR imaging of the lumbar spine was
performed. No intravenous contrast was administered.

[Series 3: T2 post-contrast · sagittal · 4.0mm · 0.88mm/px · 6 of 13 slices shown]
[im 1/13]
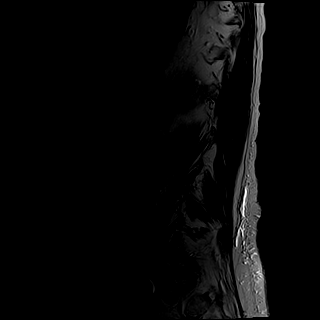
[im 3/13]
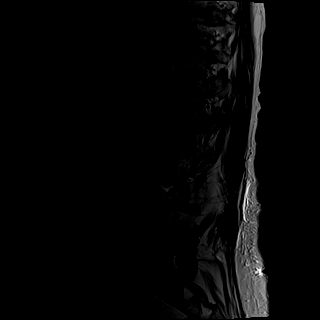
[im 5/13]
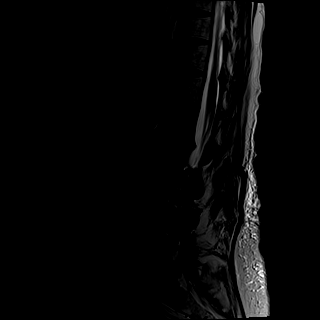
[im 8/13]
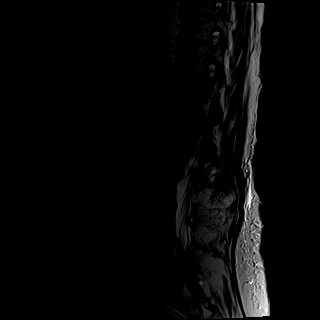
[im 10/13]
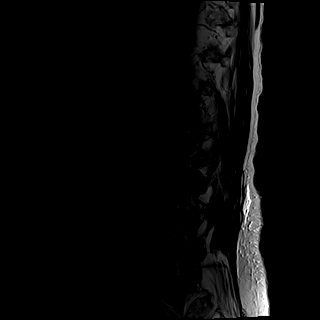
[im 13/13]
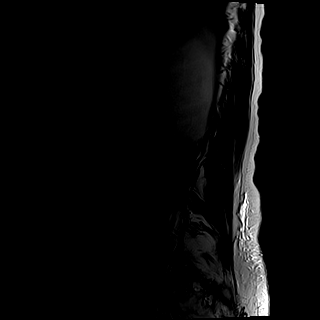

[Series 4: T1 · sagittal · 4.0mm · 0.88mm/px · 6 of 13 slices shown (1 of 2)]
[im 1/13]
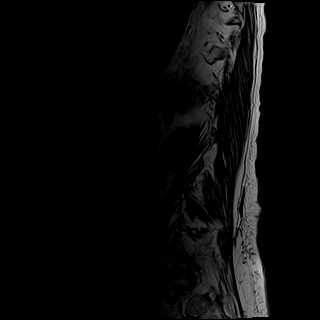
[im 3/13]
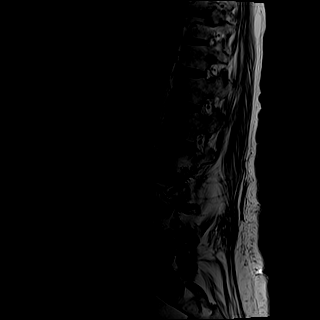
[im 5/13]
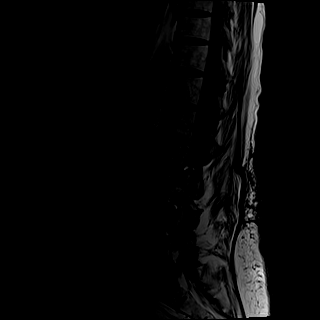
[im 8/13]
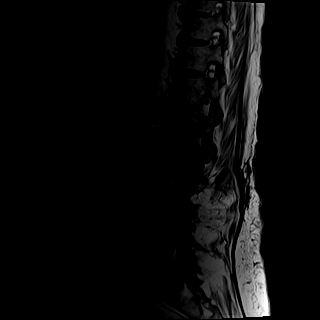
[im 10/13]
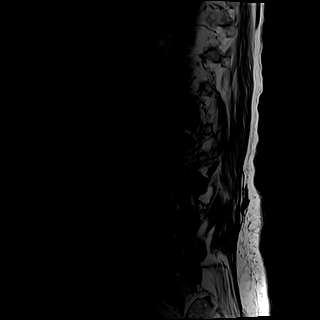
[im 13/13]
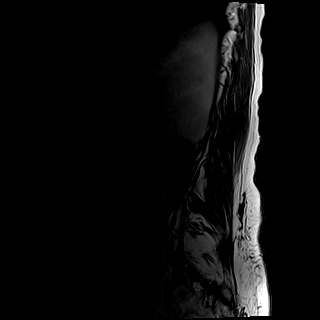

[Series 5: tirm sag · sagittal · 4.0mm · 0.55mm/px · 6 of 13 slices shown]
[im 1/13]
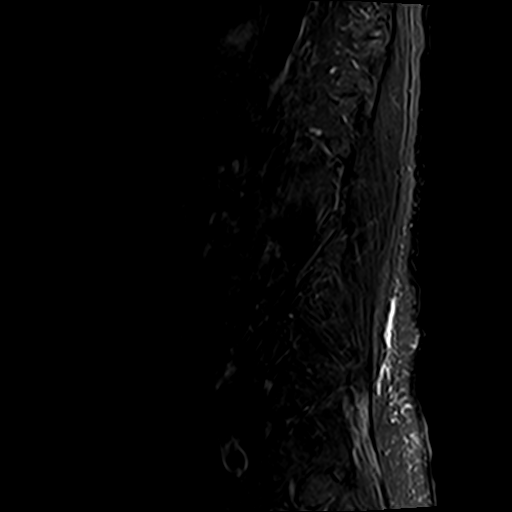
[im 3/13]
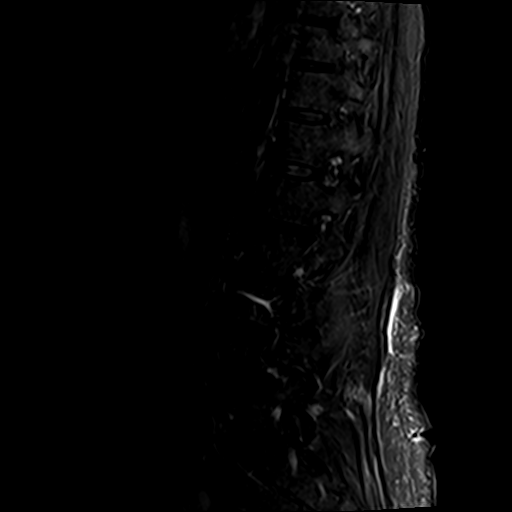
[im 5/13]
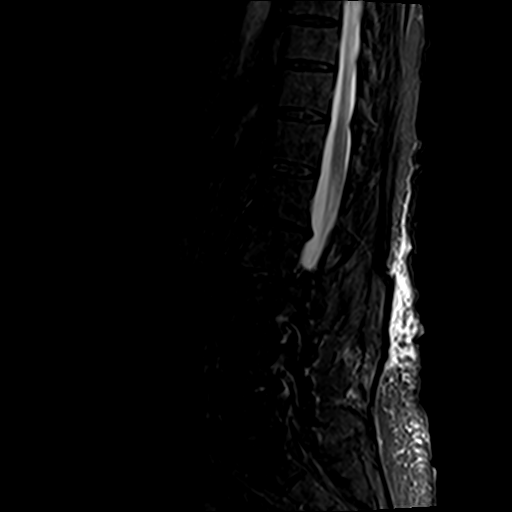
[im 8/13]
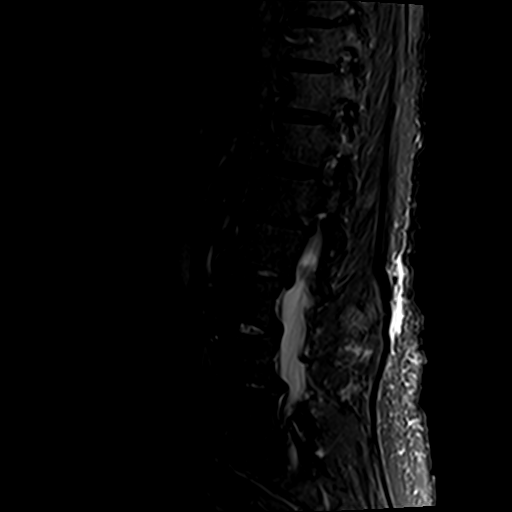
[im 10/13]
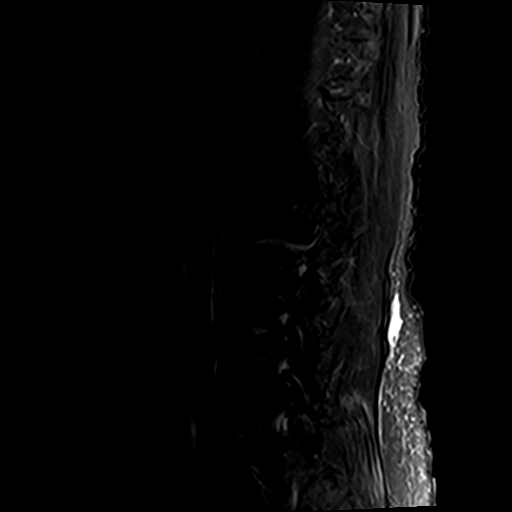
[im 13/13]
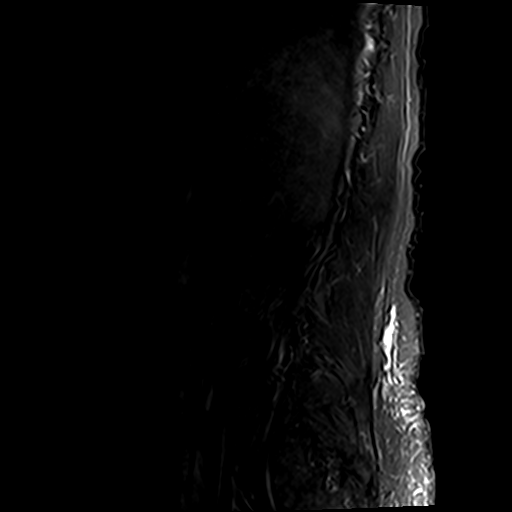

[Series 6: T1 · axial · 4.0mm · 0.78mm/px · z∈[-112,+79]mm · 9 of 34 slices shown (2 of 2)]
[im 1/34]
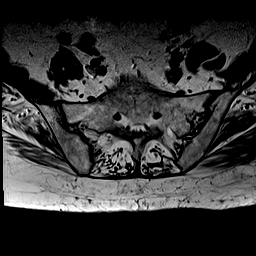
[im 5/34]
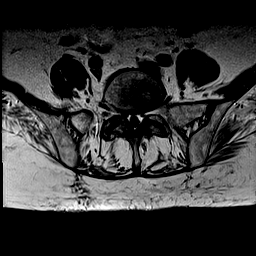
[im 10/34]
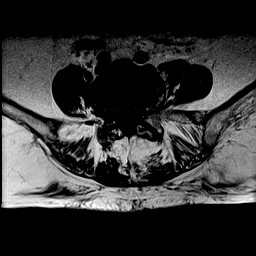
[im 15/34]
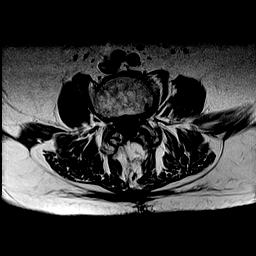
[im 17/34]
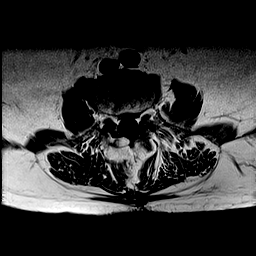
[im 19/34]
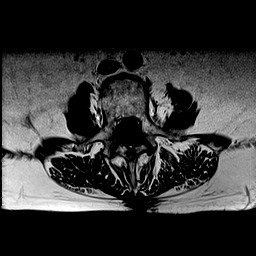
[im 24/34]
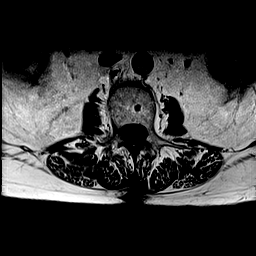
[im 29/34]
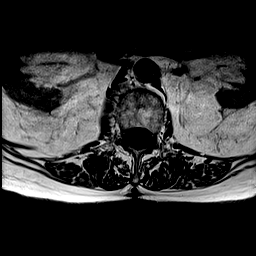
[im 34/34]
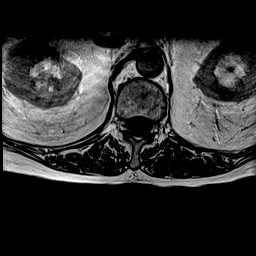

[Series 7: T2 · axial · 4.0mm · 0.78mm/px · z∈[-112,+79]mm · 12 of 34 slices shown]
[im 1/34]
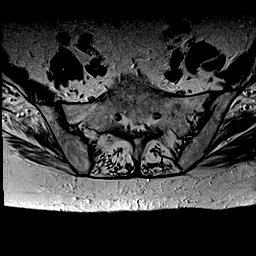
[im 3/34]
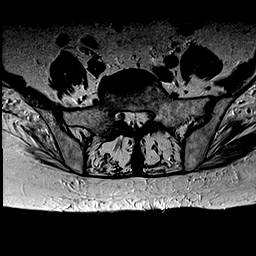
[im 5/34]
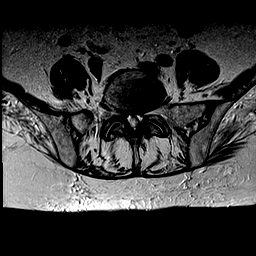
[im 8/34]
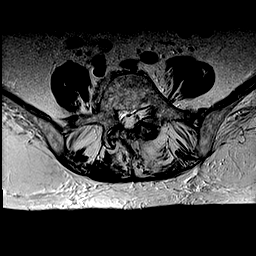
[im 10/34]
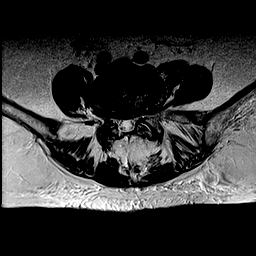
[im 12/34]
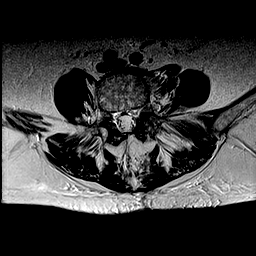
[im 15/34]
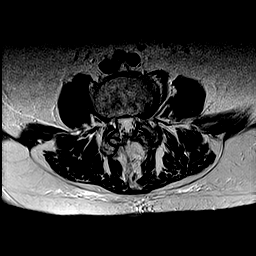
[im 17/34]
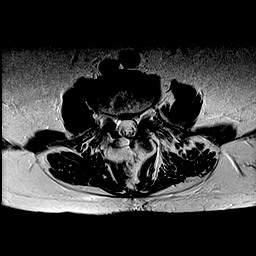
[im 19/34]
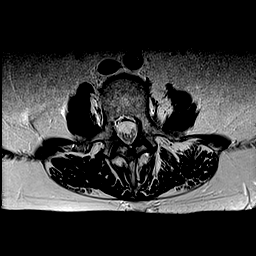
[im 24/34]
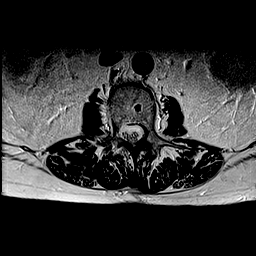
[im 29/34]
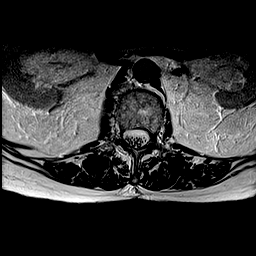
[im 34/34]
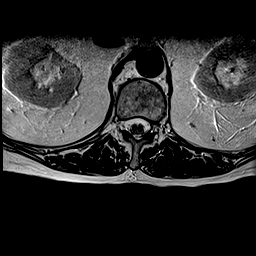

[39 of 48 positions shown; findings below may reference images not displayed]

FINDINGS: Segmentation: For the purposes of this dictation, five lumbar
vertebrae are assumed and the caudal most well-formed intervertebral
disc is designated L5-S1.

Alignment: L2-L3 grade 1 retrolisthesis. Grade 1 anterolisthesis at
the L3-L4 and L4-L5 levels.

Vertebrae: Vertebral body height is maintained. No suspicious
osseous lesions.

Conus medullaris and cauda equina: Conus extends to the L1-L2 level.
Conus and cauda equina appear normal.

Paraspinal and other soft tissues: Atrophy of the lumbar paraspinal
musculature. Aortic atherosclerosis

Disc levels:

Levels of mild to moderate disc degeneration greatest at L1-L2,
L2-L3 and L4-L5.

T12-L1: No significant canal or foraminal stenosis.

L1-L2: Disc bulge with endplate spurring. No significant spinal
canal stenosis. Mild right neural foraminal narrowing.

L2-L3: Grade 1 retrolisthesis. Disc bulge with endplate spurring.
Superimposed left subarticular/foraminal disc protrusion. Facet
arthrosis/ligamentum flavum hypertrophy. The disc protrusion
contributes to left subarticular narrowing with encroachment upon
multiple descending left-sided nerve roots, particularly the
descending left L3 nerve root. Mild central canal stenosis. Mild
right with moderate/severe left neural foraminal narrowing.

L3-L4: Sequela of prior posterior decompression. Grade 1
anterolisthesis. Disc bulge. Facet arthrosis. Mild bilateral
subarticular narrowing without significant central canal stenosis.
Moderate bilateral neural foraminal narrowing

L4-L5: Sequela prior posterior decompression. Grade 1
anterolisthesis. Disc bulge with superimposed small central disc
protrusion. Associated osteophyte ridge greatest along the left
lateral aspect of the disc space. Facet arthrosis. Bilateral
subarticular narrowing with crowding of the bilateral descending L5
nerve roots (greater on the left). Mild central canal narrowing.
Moderate/severe right with severe left neural foraminal narrowing.

L5-S1: Disc bulge with associated endplate spurring. Facet
arthrosis/ligamentum flavum hypertrophy. Slight prominence of the
epidural fat. Mild/mild central canal stenosis. Bilateral
subarticular narrowing (greater on the right) with crowding of the
descending right S1 nerve root. Moderate right with mild left neural
foraminal narrowing.
IMPRESSION: Lumbar spondylosis with sequela of prior L3-L4 and L4-L5 posterior
decompression as detailed.

Mild/moderate central canal stenosis at L5-S1. No more than mild
central canal stenosis at the remaining levels.

At L2-L3, a small disc protrusion contributes to left subarticular
stenosis with encroachment upon multiple descending left-sided nerve
roots, particularly the descending left L3 nerve root. Additional
sites of subarticular stenosis with nerve root crowding as
described.

Multilevel neural foraminal narrowing greatest on the left at L2-L3
(moderate/severe) and bilaterally at L4-L5 (moderate/severe right,
severe left). Additional sites of mild and moderate neural foraminal
narrowing as described
